# Patient Record
Sex: Male | Born: 1955 | Race: White | Hispanic: Refuse to answer | Marital: Single | Smoking: Current every day smoker
Health system: Southern US, Community
[De-identification: ages and names within clinical notes are randomized; demographics above are authoritative.]

## PROBLEM LIST (undated history)

## (undated) DIAGNOSIS — E039 Hypothyroidism, unspecified: Secondary | ICD-10-CM

## (undated) DIAGNOSIS — I639 Cerebral infarction, unspecified: Secondary | ICD-10-CM

## (undated) DIAGNOSIS — I2510 Atherosclerotic heart disease of native coronary artery without angina pectoris: Secondary | ICD-10-CM

## (undated) DIAGNOSIS — K279 Peptic ulcer, site unspecified, unspecified as acute or chronic, without hemorrhage or perforation: Secondary | ICD-10-CM

## (undated) DIAGNOSIS — I480 Paroxysmal atrial fibrillation: Secondary | ICD-10-CM

## (undated) DIAGNOSIS — I428 Other cardiomyopathies: Secondary | ICD-10-CM

## (undated) DIAGNOSIS — I38 Endocarditis, valve unspecified: Secondary | ICD-10-CM

## (undated) DIAGNOSIS — C679 Malignant neoplasm of bladder, unspecified: Secondary | ICD-10-CM

## (undated) DIAGNOSIS — I10 Essential (primary) hypertension: Secondary | ICD-10-CM

## (undated) DIAGNOSIS — F1010 Alcohol abuse, uncomplicated: Secondary | ICD-10-CM

## (undated) HISTORY — PX: TRANSURETHRAL RESECTION OF BLADDER TUMOR: SHX2575

---

## 2014-04-18 HISTORY — PX: MITRAL VALVE REPLACEMENT: SHX147

## 2014-04-18 HISTORY — PX: AORTIC VALVE REPLACEMENT: SHX41

## 2016-09-24 DIAGNOSIS — T148XXA Other injury of unspecified body region, initial encounter: Secondary | ICD-10-CM | POA: Diagnosis not present

## 2016-09-24 DIAGNOSIS — T07XXXA Unspecified multiple injuries, initial encounter: Secondary | ICD-10-CM | POA: Diagnosis not present

## 2016-09-24 DIAGNOSIS — S80211A Abrasion, right knee, initial encounter: Secondary | ICD-10-CM | POA: Diagnosis not present

## 2016-09-24 DIAGNOSIS — Z7901 Long term (current) use of anticoagulants: Secondary | ICD-10-CM | POA: Diagnosis not present

## 2016-09-24 DIAGNOSIS — S50311A Abrasion of right elbow, initial encounter: Secondary | ICD-10-CM | POA: Diagnosis not present

## 2016-09-24 DIAGNOSIS — S50312A Abrasion of left elbow, initial encounter: Secondary | ICD-10-CM | POA: Diagnosis not present

## 2016-09-24 DIAGNOSIS — S80212A Abrasion, left knee, initial encounter: Secondary | ICD-10-CM | POA: Diagnosis not present

## 2016-10-06 DIAGNOSIS — E871 Hypo-osmolality and hyponatremia: Secondary | ICD-10-CM | POA: Diagnosis not present

## 2016-10-06 DIAGNOSIS — R9431 Abnormal electrocardiogram [ECG] [EKG]: Secondary | ICD-10-CM | POA: Diagnosis not present

## 2016-10-06 DIAGNOSIS — J159 Unspecified bacterial pneumonia: Secondary | ICD-10-CM | POA: Diagnosis not present

## 2016-10-06 DIAGNOSIS — R079 Chest pain, unspecified: Secondary | ICD-10-CM | POA: Diagnosis not present

## 2016-10-06 DIAGNOSIS — R778 Other specified abnormalities of plasma proteins: Secondary | ICD-10-CM | POA: Diagnosis not present

## 2016-10-06 DIAGNOSIS — R0602 Shortness of breath: Secondary | ICD-10-CM | POA: Diagnosis not present

## 2016-10-06 DIAGNOSIS — J9601 Acute respiratory failure with hypoxia: Secondary | ICD-10-CM | POA: Diagnosis not present

## 2016-10-07 DIAGNOSIS — J189 Pneumonia, unspecified organism: Secondary | ICD-10-CM | POA: Diagnosis not present

## 2016-10-08 DIAGNOSIS — Z952 Presence of prosthetic heart valve: Secondary | ICD-10-CM | POA: Diagnosis not present

## 2016-10-08 DIAGNOSIS — I428 Other cardiomyopathies: Secondary | ICD-10-CM | POA: Diagnosis not present

## 2016-10-08 DIAGNOSIS — R748 Abnormal levels of other serum enzymes: Secondary | ICD-10-CM | POA: Diagnosis not present

## 2016-10-08 DIAGNOSIS — I480 Paroxysmal atrial fibrillation: Secondary | ICD-10-CM | POA: Diagnosis not present

## 2016-10-09 DIAGNOSIS — R200 Anesthesia of skin: Secondary | ICD-10-CM | POA: Diagnosis not present

## 2016-10-09 DIAGNOSIS — Z952 Presence of prosthetic heart valve: Secondary | ICD-10-CM | POA: Diagnosis not present

## 2016-10-09 DIAGNOSIS — I480 Paroxysmal atrial fibrillation: Secondary | ICD-10-CM | POA: Diagnosis not present

## 2016-10-09 DIAGNOSIS — R748 Abnormal levels of other serum enzymes: Secondary | ICD-10-CM | POA: Diagnosis not present

## 2016-10-09 DIAGNOSIS — I428 Other cardiomyopathies: Secondary | ICD-10-CM | POA: Diagnosis not present

## 2016-10-10 DIAGNOSIS — R0602 Shortness of breath: Secondary | ICD-10-CM | POA: Diagnosis not present

## 2016-10-10 DIAGNOSIS — Z952 Presence of prosthetic heart valve: Secondary | ICD-10-CM | POA: Diagnosis not present

## 2016-10-10 DIAGNOSIS — I5043 Acute on chronic combined systolic (congestive) and diastolic (congestive) heart failure: Secondary | ICD-10-CM | POA: Diagnosis not present

## 2016-10-10 DIAGNOSIS — R748 Abnormal levels of other serum enzymes: Secondary | ICD-10-CM | POA: Diagnosis not present

## 2016-10-10 DIAGNOSIS — I428 Other cardiomyopathies: Secondary | ICD-10-CM | POA: Diagnosis not present

## 2016-10-11 DIAGNOSIS — Z952 Presence of prosthetic heart valve: Secondary | ICD-10-CM | POA: Diagnosis not present

## 2016-10-11 DIAGNOSIS — I428 Other cardiomyopathies: Secondary | ICD-10-CM | POA: Diagnosis not present

## 2016-10-11 DIAGNOSIS — I480 Paroxysmal atrial fibrillation: Secondary | ICD-10-CM | POA: Diagnosis not present

## 2016-10-11 DIAGNOSIS — R748 Abnormal levels of other serum enzymes: Secondary | ICD-10-CM | POA: Diagnosis not present

## 2016-10-12 DIAGNOSIS — J441 Chronic obstructive pulmonary disease with (acute) exacerbation: Secondary | ICD-10-CM | POA: Diagnosis not present

## 2016-10-12 DIAGNOSIS — Z952 Presence of prosthetic heart valve: Secondary | ICD-10-CM | POA: Diagnosis not present

## 2016-10-12 DIAGNOSIS — I5043 Acute on chronic combined systolic (congestive) and diastolic (congestive) heart failure: Secondary | ICD-10-CM | POA: Diagnosis not present

## 2016-10-12 DIAGNOSIS — I959 Hypotension, unspecified: Secondary | ICD-10-CM | POA: Diagnosis not present

## 2016-10-13 DIAGNOSIS — I472 Ventricular tachycardia: Secondary | ICD-10-CM | POA: Diagnosis not present

## 2016-10-13 DIAGNOSIS — I6523 Occlusion and stenosis of bilateral carotid arteries: Secondary | ICD-10-CM | POA: Diagnosis not present

## 2016-10-13 DIAGNOSIS — Z952 Presence of prosthetic heart valve: Secondary | ICD-10-CM | POA: Diagnosis not present

## 2016-10-13 DIAGNOSIS — I5043 Acute on chronic combined systolic (congestive) and diastolic (congestive) heart failure: Secondary | ICD-10-CM | POA: Diagnosis not present

## 2016-10-16 DIAGNOSIS — R2681 Unsteadiness on feet: Secondary | ICD-10-CM | POA: Diagnosis not present

## 2016-10-16 DIAGNOSIS — J449 Chronic obstructive pulmonary disease, unspecified: Secondary | ICD-10-CM | POA: Diagnosis not present

## 2016-10-16 DIAGNOSIS — R278 Other lack of coordination: Secondary | ICD-10-CM | POA: Diagnosis not present

## 2016-10-16 DIAGNOSIS — E785 Hyperlipidemia, unspecified: Secondary | ICD-10-CM | POA: Diagnosis not present

## 2016-10-16 DIAGNOSIS — R41841 Cognitive communication deficit: Secondary | ICD-10-CM | POA: Diagnosis not present

## 2016-10-16 DIAGNOSIS — D510 Vitamin B12 deficiency anemia due to intrinsic factor deficiency: Secondary | ICD-10-CM | POA: Diagnosis not present

## 2016-10-16 DIAGNOSIS — I214 Non-ST elevation (NSTEMI) myocardial infarction: Secondary | ICD-10-CM | POA: Diagnosis not present

## 2016-10-16 DIAGNOSIS — I639 Cerebral infarction, unspecified: Secondary | ICD-10-CM | POA: Diagnosis not present

## 2016-10-16 DIAGNOSIS — E039 Hypothyroidism, unspecified: Secondary | ICD-10-CM | POA: Diagnosis not present

## 2016-10-16 DIAGNOSIS — Z7901 Long term (current) use of anticoagulants: Secondary | ICD-10-CM | POA: Diagnosis not present

## 2016-10-16 DIAGNOSIS — R1312 Dysphagia, oropharyngeal phase: Secondary | ICD-10-CM | POA: Diagnosis not present

## 2016-10-16 DIAGNOSIS — I5020 Unspecified systolic (congestive) heart failure: Secondary | ICD-10-CM | POA: Diagnosis not present

## 2016-10-16 DIAGNOSIS — I426 Alcoholic cardiomyopathy: Secondary | ICD-10-CM | POA: Diagnosis not present

## 2016-10-16 DIAGNOSIS — I480 Paroxysmal atrial fibrillation: Secondary | ICD-10-CM | POA: Diagnosis not present

## 2016-10-16 DIAGNOSIS — I5022 Chronic systolic (congestive) heart failure: Secondary | ICD-10-CM | POA: Diagnosis not present

## 2016-10-16 DIAGNOSIS — Z8673 Personal history of transient ischemic attack (TIA), and cerebral infarction without residual deficits: Secondary | ICD-10-CM | POA: Diagnosis not present

## 2016-10-16 DIAGNOSIS — Z79899 Other long term (current) drug therapy: Secondary | ICD-10-CM | POA: Diagnosis not present

## 2016-10-16 DIAGNOSIS — J159 Unspecified bacterial pneumonia: Secondary | ICD-10-CM | POA: Diagnosis not present

## 2016-10-16 DIAGNOSIS — R1319 Other dysphagia: Secondary | ICD-10-CM | POA: Diagnosis not present

## 2016-10-16 DIAGNOSIS — M6281 Muscle weakness (generalized): Secondary | ICD-10-CM | POA: Diagnosis not present

## 2016-10-17 DIAGNOSIS — Z7901 Long term (current) use of anticoagulants: Secondary | ICD-10-CM | POA: Diagnosis not present

## 2016-10-18 DIAGNOSIS — Z7901 Long term (current) use of anticoagulants: Secondary | ICD-10-CM | POA: Diagnosis not present

## 2016-10-19 DIAGNOSIS — Z7901 Long term (current) use of anticoagulants: Secondary | ICD-10-CM | POA: Diagnosis not present

## 2016-10-20 DIAGNOSIS — Z79899 Other long term (current) drug therapy: Secondary | ICD-10-CM | POA: Diagnosis not present

## 2016-10-20 DIAGNOSIS — Z7901 Long term (current) use of anticoagulants: Secondary | ICD-10-CM | POA: Diagnosis not present

## 2016-10-21 DIAGNOSIS — Z7901 Long term (current) use of anticoagulants: Secondary | ICD-10-CM | POA: Diagnosis not present

## 2016-10-22 DIAGNOSIS — J449 Chronic obstructive pulmonary disease, unspecified: Secondary | ICD-10-CM | POA: Diagnosis not present

## 2016-10-22 DIAGNOSIS — I480 Paroxysmal atrial fibrillation: Secondary | ICD-10-CM | POA: Diagnosis not present

## 2016-10-22 DIAGNOSIS — E785 Hyperlipidemia, unspecified: Secondary | ICD-10-CM | POA: Diagnosis not present

## 2016-10-22 DIAGNOSIS — E039 Hypothyroidism, unspecified: Secondary | ICD-10-CM | POA: Diagnosis not present

## 2016-10-23 DIAGNOSIS — Z7901 Long term (current) use of anticoagulants: Secondary | ICD-10-CM | POA: Diagnosis not present

## 2016-10-23 DIAGNOSIS — Z79899 Other long term (current) drug therapy: Secondary | ICD-10-CM | POA: Diagnosis not present

## 2016-10-28 DIAGNOSIS — D510 Vitamin B12 deficiency anemia due to intrinsic factor deficiency: Secondary | ICD-10-CM | POA: Diagnosis not present

## 2016-10-28 DIAGNOSIS — Z7901 Long term (current) use of anticoagulants: Secondary | ICD-10-CM | POA: Diagnosis not present

## 2016-10-28 DIAGNOSIS — Z79899 Other long term (current) drug therapy: Secondary | ICD-10-CM | POA: Diagnosis not present

## 2016-11-02 DIAGNOSIS — I5022 Chronic systolic (congestive) heart failure: Secondary | ICD-10-CM | POA: Diagnosis not present

## 2016-11-02 DIAGNOSIS — J159 Unspecified bacterial pneumonia: Secondary | ICD-10-CM | POA: Diagnosis not present

## 2016-11-02 DIAGNOSIS — E039 Hypothyroidism, unspecified: Secondary | ICD-10-CM | POA: Diagnosis not present

## 2016-11-02 DIAGNOSIS — I4891 Unspecified atrial fibrillation: Secondary | ICD-10-CM | POA: Diagnosis not present

## 2016-11-03 DIAGNOSIS — Z952 Presence of prosthetic heart valve: Secondary | ICD-10-CM | POA: Diagnosis not present

## 2016-11-03 DIAGNOSIS — Z7901 Long term (current) use of anticoagulants: Secondary | ICD-10-CM | POA: Diagnosis not present

## 2016-11-03 DIAGNOSIS — I359 Nonrheumatic aortic valve disorder, unspecified: Secondary | ICD-10-CM | POA: Diagnosis not present

## 2016-11-06 DIAGNOSIS — Z7901 Long term (current) use of anticoagulants: Secondary | ICD-10-CM | POA: Diagnosis not present

## 2016-11-06 DIAGNOSIS — I359 Nonrheumatic aortic valve disorder, unspecified: Secondary | ICD-10-CM | POA: Diagnosis not present

## 2016-11-06 DIAGNOSIS — Z952 Presence of prosthetic heart valve: Secondary | ICD-10-CM | POA: Diagnosis not present

## 2016-11-12 DIAGNOSIS — I426 Alcoholic cardiomyopathy: Secondary | ICD-10-CM | POA: Diagnosis not present

## 2016-11-12 DIAGNOSIS — I639 Cerebral infarction, unspecified: Secondary | ICD-10-CM | POA: Diagnosis not present

## 2016-11-12 DIAGNOSIS — Z7901 Long term (current) use of anticoagulants: Secondary | ICD-10-CM | POA: Diagnosis not present

## 2016-11-12 DIAGNOSIS — Z952 Presence of prosthetic heart valve: Secondary | ICD-10-CM | POA: Diagnosis not present

## 2016-11-12 DIAGNOSIS — I480 Paroxysmal atrial fibrillation: Secondary | ICD-10-CM | POA: Diagnosis not present

## 2016-11-19 DIAGNOSIS — Z7901 Long term (current) use of anticoagulants: Secondary | ICD-10-CM | POA: Diagnosis not present

## 2016-11-20 DIAGNOSIS — Z7901 Long term (current) use of anticoagulants: Secondary | ICD-10-CM | POA: Diagnosis not present

## 2016-11-21 DIAGNOSIS — R791 Abnormal coagulation profile: Secondary | ICD-10-CM | POA: Diagnosis not present

## 2016-11-23 DIAGNOSIS — Z7901 Long term (current) use of anticoagulants: Secondary | ICD-10-CM | POA: Diagnosis not present

## 2016-11-26 DIAGNOSIS — Z7901 Long term (current) use of anticoagulants: Secondary | ICD-10-CM | POA: Diagnosis not present

## 2016-12-03 DIAGNOSIS — E876 Hypokalemia: Secondary | ICD-10-CM | POA: Diagnosis not present

## 2016-12-03 DIAGNOSIS — I4891 Unspecified atrial fibrillation: Secondary | ICD-10-CM | POA: Diagnosis not present

## 2016-12-17 DIAGNOSIS — F17200 Nicotine dependence, unspecified, uncomplicated: Secondary | ICD-10-CM | POA: Diagnosis not present

## 2016-12-17 DIAGNOSIS — I639 Cerebral infarction, unspecified: Secondary | ICD-10-CM | POA: Diagnosis not present

## 2016-12-17 DIAGNOSIS — I4891 Unspecified atrial fibrillation: Secondary | ICD-10-CM | POA: Diagnosis not present

## 2016-12-17 DIAGNOSIS — Z952 Presence of prosthetic heart valve: Secondary | ICD-10-CM | POA: Diagnosis not present

## 2016-12-17 DIAGNOSIS — I426 Alcoholic cardiomyopathy: Secondary | ICD-10-CM | POA: Diagnosis not present

## 2017-01-04 DIAGNOSIS — I426 Alcoholic cardiomyopathy: Secondary | ICD-10-CM | POA: Diagnosis not present

## 2017-01-21 DIAGNOSIS — I5032 Chronic diastolic (congestive) heart failure: Secondary | ICD-10-CM | POA: Diagnosis not present

## 2017-01-21 DIAGNOSIS — I639 Cerebral infarction, unspecified: Secondary | ICD-10-CM | POA: Diagnosis not present

## 2017-01-21 DIAGNOSIS — I482 Chronic atrial fibrillation: Secondary | ICD-10-CM | POA: Diagnosis not present

## 2017-01-21 DIAGNOSIS — Z952 Presence of prosthetic heart valve: Secondary | ICD-10-CM | POA: Diagnosis not present

## 2017-02-19 DIAGNOSIS — Z7901 Long term (current) use of anticoagulants: Secondary | ICD-10-CM | POA: Diagnosis not present

## 2017-05-11 DIAGNOSIS — E876 Hypokalemia: Secondary | ICD-10-CM | POA: Diagnosis not present

## 2017-05-11 DIAGNOSIS — I4892 Unspecified atrial flutter: Secondary | ICD-10-CM | POA: Diagnosis not present

## 2017-06-14 DIAGNOSIS — Z7901 Long term (current) use of anticoagulants: Secondary | ICD-10-CM | POA: Diagnosis not present

## 2017-06-14 DIAGNOSIS — I4891 Unspecified atrial fibrillation: Secondary | ICD-10-CM | POA: Diagnosis not present

## 2017-07-19 DIAGNOSIS — I4891 Unspecified atrial fibrillation: Secondary | ICD-10-CM | POA: Diagnosis not present

## 2017-08-02 DIAGNOSIS — Z7901 Long term (current) use of anticoagulants: Secondary | ICD-10-CM | POA: Diagnosis not present

## 2017-08-02 DIAGNOSIS — I4891 Unspecified atrial fibrillation: Secondary | ICD-10-CM | POA: Diagnosis not present

## 2017-08-03 DIAGNOSIS — Z23 Encounter for immunization: Secondary | ICD-10-CM | POA: Diagnosis not present

## 2017-08-25 DIAGNOSIS — I480 Paroxysmal atrial fibrillation: Secondary | ICD-10-CM | POA: Diagnosis not present

## 2017-08-25 DIAGNOSIS — Z8673 Personal history of transient ischemic attack (TIA), and cerebral infarction without residual deficits: Secondary | ICD-10-CM | POA: Diagnosis not present

## 2017-08-25 DIAGNOSIS — I5022 Chronic systolic (congestive) heart failure: Secondary | ICD-10-CM | POA: Diagnosis not present

## 2017-08-25 DIAGNOSIS — Z952 Presence of prosthetic heart valve: Secondary | ICD-10-CM | POA: Diagnosis not present

## 2017-09-02 DIAGNOSIS — I480 Paroxysmal atrial fibrillation: Secondary | ICD-10-CM | POA: Diagnosis not present

## 2017-09-08 DIAGNOSIS — I480 Paroxysmal atrial fibrillation: Secondary | ICD-10-CM | POA: Diagnosis not present

## 2017-09-22 DIAGNOSIS — I480 Paroxysmal atrial fibrillation: Secondary | ICD-10-CM | POA: Diagnosis not present

## 2017-10-20 DIAGNOSIS — I4891 Unspecified atrial fibrillation: Secondary | ICD-10-CM | POA: Diagnosis not present

## 2017-11-17 DIAGNOSIS — I480 Paroxysmal atrial fibrillation: Secondary | ICD-10-CM | POA: Diagnosis not present

## 2017-11-24 DIAGNOSIS — Z952 Presence of prosthetic heart valve: Secondary | ICD-10-CM | POA: Diagnosis not present

## 2017-11-24 DIAGNOSIS — E039 Hypothyroidism, unspecified: Secondary | ICD-10-CM | POA: Diagnosis not present

## 2017-11-24 DIAGNOSIS — I252 Old myocardial infarction: Secondary | ICD-10-CM | POA: Diagnosis not present

## 2017-11-24 DIAGNOSIS — I5022 Chronic systolic (congestive) heart failure: Secondary | ICD-10-CM | POA: Diagnosis not present

## 2017-11-24 DIAGNOSIS — I428 Other cardiomyopathies: Secondary | ICD-10-CM | POA: Diagnosis not present

## 2017-11-24 DIAGNOSIS — F17200 Nicotine dependence, unspecified, uncomplicated: Secondary | ICD-10-CM | POA: Diagnosis not present

## 2017-11-24 DIAGNOSIS — I480 Paroxysmal atrial fibrillation: Secondary | ICD-10-CM | POA: Diagnosis not present

## 2017-11-24 DIAGNOSIS — Z8673 Personal history of transient ischemic attack (TIA), and cerebral infarction without residual deficits: Secondary | ICD-10-CM | POA: Diagnosis not present

## 2017-12-08 DIAGNOSIS — I480 Paroxysmal atrial fibrillation: Secondary | ICD-10-CM | POA: Diagnosis not present

## 2018-01-05 DIAGNOSIS — I480 Paroxysmal atrial fibrillation: Secondary | ICD-10-CM | POA: Diagnosis not present

## 2018-02-02 DIAGNOSIS — I480 Paroxysmal atrial fibrillation: Secondary | ICD-10-CM | POA: Diagnosis not present

## 2018-02-16 DIAGNOSIS — I480 Paroxysmal atrial fibrillation: Secondary | ICD-10-CM | POA: Diagnosis not present

## 2018-03-16 DIAGNOSIS — I480 Paroxysmal atrial fibrillation: Secondary | ICD-10-CM | POA: Diagnosis not present

## 2018-04-15 DIAGNOSIS — Z8673 Personal history of transient ischemic attack (TIA), and cerebral infarction without residual deficits: Secondary | ICD-10-CM | POA: Diagnosis not present

## 2018-04-15 DIAGNOSIS — I480 Paroxysmal atrial fibrillation: Secondary | ICD-10-CM | POA: Diagnosis not present

## 2018-04-15 DIAGNOSIS — I428 Other cardiomyopathies: Secondary | ICD-10-CM | POA: Diagnosis not present

## 2018-04-15 DIAGNOSIS — I252 Old myocardial infarction: Secondary | ICD-10-CM | POA: Diagnosis not present

## 2018-04-15 DIAGNOSIS — Z952 Presence of prosthetic heart valve: Secondary | ICD-10-CM | POA: Diagnosis not present

## 2018-04-15 DIAGNOSIS — I5022 Chronic systolic (congestive) heart failure: Secondary | ICD-10-CM | POA: Diagnosis not present

## 2018-04-20 DIAGNOSIS — Z952 Presence of prosthetic heart valve: Secondary | ICD-10-CM | POA: Diagnosis not present

## 2018-05-13 DIAGNOSIS — I480 Paroxysmal atrial fibrillation: Secondary | ICD-10-CM | POA: Diagnosis not present

## 2018-05-13 DIAGNOSIS — Z7901 Long term (current) use of anticoagulants: Secondary | ICD-10-CM | POA: Diagnosis not present

## 2018-05-27 DIAGNOSIS — I480 Paroxysmal atrial fibrillation: Secondary | ICD-10-CM | POA: Diagnosis not present

## 2018-05-27 DIAGNOSIS — Z7901 Long term (current) use of anticoagulants: Secondary | ICD-10-CM | POA: Diagnosis not present

## 2018-06-24 DIAGNOSIS — I480 Paroxysmal atrial fibrillation: Secondary | ICD-10-CM | POA: Diagnosis not present

## 2018-06-24 DIAGNOSIS — Z7901 Long term (current) use of anticoagulants: Secondary | ICD-10-CM | POA: Diagnosis not present

## 2018-07-08 DIAGNOSIS — I480 Paroxysmal atrial fibrillation: Secondary | ICD-10-CM | POA: Diagnosis not present

## 2018-07-08 DIAGNOSIS — Z7901 Long term (current) use of anticoagulants: Secondary | ICD-10-CM | POA: Diagnosis not present

## 2018-07-28 DIAGNOSIS — Z23 Encounter for immunization: Secondary | ICD-10-CM | POA: Diagnosis not present

## 2018-08-05 DIAGNOSIS — I480 Paroxysmal atrial fibrillation: Secondary | ICD-10-CM | POA: Diagnosis not present

## 2018-08-05 DIAGNOSIS — Z7901 Long term (current) use of anticoagulants: Secondary | ICD-10-CM | POA: Diagnosis not present

## 2018-08-17 DIAGNOSIS — I428 Other cardiomyopathies: Secondary | ICD-10-CM | POA: Diagnosis not present

## 2018-08-17 DIAGNOSIS — Z953 Presence of xenogenic heart valve: Secondary | ICD-10-CM | POA: Diagnosis not present

## 2018-08-17 DIAGNOSIS — Z952 Presence of prosthetic heart valve: Secondary | ICD-10-CM | POA: Diagnosis not present

## 2018-08-17 DIAGNOSIS — F17210 Nicotine dependence, cigarettes, uncomplicated: Secondary | ICD-10-CM | POA: Diagnosis not present

## 2018-08-17 DIAGNOSIS — Z7901 Long term (current) use of anticoagulants: Secondary | ICD-10-CM | POA: Diagnosis not present

## 2018-08-17 DIAGNOSIS — I252 Old myocardial infarction: Secondary | ICD-10-CM | POA: Diagnosis not present

## 2018-08-17 DIAGNOSIS — I5022 Chronic systolic (congestive) heart failure: Secondary | ICD-10-CM | POA: Diagnosis not present

## 2018-08-17 DIAGNOSIS — I480 Paroxysmal atrial fibrillation: Secondary | ICD-10-CM | POA: Diagnosis not present

## 2018-08-17 DIAGNOSIS — Z8673 Personal history of transient ischemic attack (TIA), and cerebral infarction without residual deficits: Secondary | ICD-10-CM | POA: Diagnosis not present

## 2018-09-02 DIAGNOSIS — Z7901 Long term (current) use of anticoagulants: Secondary | ICD-10-CM | POA: Diagnosis not present

## 2018-09-02 DIAGNOSIS — I480 Paroxysmal atrial fibrillation: Secondary | ICD-10-CM | POA: Diagnosis not present

## 2018-09-14 DIAGNOSIS — Z7901 Long term (current) use of anticoagulants: Secondary | ICD-10-CM | POA: Diagnosis not present

## 2018-09-14 DIAGNOSIS — I480 Paroxysmal atrial fibrillation: Secondary | ICD-10-CM | POA: Diagnosis not present

## 2018-10-14 DIAGNOSIS — I480 Paroxysmal atrial fibrillation: Secondary | ICD-10-CM | POA: Diagnosis not present

## 2018-10-14 DIAGNOSIS — Z7901 Long term (current) use of anticoagulants: Secondary | ICD-10-CM | POA: Diagnosis not present

## 2018-11-11 DIAGNOSIS — Z7901 Long term (current) use of anticoagulants: Secondary | ICD-10-CM | POA: Diagnosis not present

## 2018-11-11 DIAGNOSIS — I480 Paroxysmal atrial fibrillation: Secondary | ICD-10-CM | POA: Diagnosis not present

## 2018-12-09 DIAGNOSIS — Z7901 Long term (current) use of anticoagulants: Secondary | ICD-10-CM | POA: Diagnosis not present

## 2018-12-09 DIAGNOSIS — I480 Paroxysmal atrial fibrillation: Secondary | ICD-10-CM | POA: Diagnosis not present

## 2019-01-13 DIAGNOSIS — I5022 Chronic systolic (congestive) heart failure: Secondary | ICD-10-CM | POA: Diagnosis not present

## 2019-01-13 DIAGNOSIS — I252 Old myocardial infarction: Secondary | ICD-10-CM | POA: Diagnosis not present

## 2019-01-13 DIAGNOSIS — I428 Other cardiomyopathies: Secondary | ICD-10-CM | POA: Diagnosis not present

## 2019-01-13 DIAGNOSIS — Z952 Presence of prosthetic heart valve: Secondary | ICD-10-CM | POA: Diagnosis not present

## 2019-01-13 DIAGNOSIS — Z7901 Long term (current) use of anticoagulants: Secondary | ICD-10-CM | POA: Diagnosis not present

## 2019-01-13 DIAGNOSIS — Z8679 Personal history of other diseases of the circulatory system: Secondary | ICD-10-CM | POA: Diagnosis not present

## 2019-01-13 DIAGNOSIS — E039 Hypothyroidism, unspecified: Secondary | ICD-10-CM | POA: Diagnosis not present

## 2019-01-13 DIAGNOSIS — F17210 Nicotine dependence, cigarettes, uncomplicated: Secondary | ICD-10-CM | POA: Diagnosis not present

## 2019-01-13 DIAGNOSIS — Z8673 Personal history of transient ischemic attack (TIA), and cerebral infarction without residual deficits: Secondary | ICD-10-CM | POA: Diagnosis not present

## 2019-01-13 DIAGNOSIS — I480 Paroxysmal atrial fibrillation: Secondary | ICD-10-CM | POA: Diagnosis not present

## 2019-01-26 DIAGNOSIS — I480 Paroxysmal atrial fibrillation: Secondary | ICD-10-CM | POA: Diagnosis not present

## 2019-01-26 DIAGNOSIS — Z7901 Long term (current) use of anticoagulants: Secondary | ICD-10-CM | POA: Diagnosis not present

## 2019-02-16 DIAGNOSIS — R310 Gross hematuria: Secondary | ICD-10-CM | POA: Diagnosis not present

## 2019-02-16 DIAGNOSIS — N401 Enlarged prostate with lower urinary tract symptoms: Secondary | ICD-10-CM | POA: Diagnosis not present

## 2019-02-21 DIAGNOSIS — R310 Gross hematuria: Secondary | ICD-10-CM | POA: Diagnosis not present

## 2019-02-22 DIAGNOSIS — R310 Gross hematuria: Secondary | ICD-10-CM | POA: Diagnosis not present

## 2019-02-22 DIAGNOSIS — N401 Enlarged prostate with lower urinary tract symptoms: Secondary | ICD-10-CM | POA: Diagnosis not present

## 2019-02-22 DIAGNOSIS — N3289 Other specified disorders of bladder: Secondary | ICD-10-CM | POA: Diagnosis not present

## 2019-02-23 DIAGNOSIS — I480 Paroxysmal atrial fibrillation: Secondary | ICD-10-CM | POA: Diagnosis not present

## 2019-02-23 DIAGNOSIS — Z7901 Long term (current) use of anticoagulants: Secondary | ICD-10-CM | POA: Diagnosis not present

## 2019-03-09 DIAGNOSIS — Z7901 Long term (current) use of anticoagulants: Secondary | ICD-10-CM | POA: Diagnosis not present

## 2019-03-09 DIAGNOSIS — I480 Paroxysmal atrial fibrillation: Secondary | ICD-10-CM | POA: Diagnosis not present

## 2019-03-21 DIAGNOSIS — Z125 Encounter for screening for malignant neoplasm of prostate: Secondary | ICD-10-CM | POA: Diagnosis not present

## 2019-03-21 DIAGNOSIS — N3289 Other specified disorders of bladder: Secondary | ICD-10-CM | POA: Diagnosis not present

## 2019-04-06 DIAGNOSIS — I480 Paroxysmal atrial fibrillation: Secondary | ICD-10-CM | POA: Diagnosis not present

## 2019-04-06 DIAGNOSIS — Z7901 Long term (current) use of anticoagulants: Secondary | ICD-10-CM | POA: Diagnosis not present

## 2019-04-20 DIAGNOSIS — I480 Paroxysmal atrial fibrillation: Secondary | ICD-10-CM | POA: Diagnosis not present

## 2019-04-20 DIAGNOSIS — Z7901 Long term (current) use of anticoagulants: Secondary | ICD-10-CM | POA: Diagnosis not present

## 2019-05-24 DIAGNOSIS — Z7901 Long term (current) use of anticoagulants: Secondary | ICD-10-CM | POA: Diagnosis not present

## 2019-05-24 DIAGNOSIS — I428 Other cardiomyopathies: Secondary | ICD-10-CM | POA: Diagnosis not present

## 2019-05-24 DIAGNOSIS — I480 Paroxysmal atrial fibrillation: Secondary | ICD-10-CM | POA: Diagnosis not present

## 2019-05-24 DIAGNOSIS — I5022 Chronic systolic (congestive) heart failure: Secondary | ICD-10-CM | POA: Diagnosis not present

## 2019-05-24 DIAGNOSIS — I252 Old myocardial infarction: Secondary | ICD-10-CM | POA: Diagnosis not present

## 2019-05-24 DIAGNOSIS — Z8673 Personal history of transient ischemic attack (TIA), and cerebral infarction without residual deficits: Secondary | ICD-10-CM | POA: Diagnosis not present

## 2019-05-24 DIAGNOSIS — Z952 Presence of prosthetic heart valve: Secondary | ICD-10-CM | POA: Diagnosis not present

## 2019-06-05 DIAGNOSIS — Z952 Presence of prosthetic heart valve: Secondary | ICD-10-CM | POA: Diagnosis not present

## 2019-06-21 DIAGNOSIS — I480 Paroxysmal atrial fibrillation: Secondary | ICD-10-CM | POA: Diagnosis not present

## 2019-06-21 DIAGNOSIS — Z7901 Long term (current) use of anticoagulants: Secondary | ICD-10-CM | POA: Diagnosis not present

## 2019-06-29 DIAGNOSIS — Z23 Encounter for immunization: Secondary | ICD-10-CM | POA: Diagnosis not present

## 2019-07-05 DIAGNOSIS — Z7901 Long term (current) use of anticoagulants: Secondary | ICD-10-CM | POA: Diagnosis not present

## 2019-07-05 DIAGNOSIS — I480 Paroxysmal atrial fibrillation: Secondary | ICD-10-CM | POA: Diagnosis not present

## 2019-10-20 HISTORY — PX: LAPAROSCOPIC APPENDECTOMY: SUR753

## 2021-01-03 ENCOUNTER — Inpatient Hospital Stay (HOSPITAL_COMMUNITY)
Admission: AD | Admit: 2021-01-03 | Discharge: 2021-01-21 | DRG: 871 | Disposition: A | Discharge status: Skilled Nursing Facility | Payer: Medicare HMO | Source: Other Acute Inpatient Hospital | Attending: Family Medicine | Admitting: Family Medicine

## 2021-01-03 ENCOUNTER — Encounter (HOSPITAL_COMMUNITY): Payer: Self-pay | Admitting: Internal Medicine

## 2021-01-03 ENCOUNTER — Inpatient Hospital Stay (HOSPITAL_COMMUNITY): Payer: Medicare HMO

## 2021-01-03 DIAGNOSIS — M542 Cervicalgia: Secondary | ICD-10-CM | POA: Diagnosis not present

## 2021-01-03 DIAGNOSIS — I428 Other cardiomyopathies: Secondary | ICD-10-CM | POA: Diagnosis present

## 2021-01-03 DIAGNOSIS — I252 Old myocardial infarction: Secondary | ICD-10-CM | POA: Diagnosis present

## 2021-01-03 DIAGNOSIS — I6340 Cerebral infarction due to embolism of unspecified cerebral artery: Secondary | ICD-10-CM | POA: Diagnosis present

## 2021-01-03 DIAGNOSIS — D696 Thrombocytopenia, unspecified: Secondary | ICD-10-CM | POA: Diagnosis present

## 2021-01-03 DIAGNOSIS — K083 Retained dental root: Secondary | ICD-10-CM | POA: Diagnosis present

## 2021-01-03 DIAGNOSIS — D62 Acute posthemorrhagic anemia: Secondary | ICD-10-CM | POA: Diagnosis present

## 2021-01-03 DIAGNOSIS — D649 Anemia, unspecified: Secondary | ICD-10-CM | POA: Diagnosis not present

## 2021-01-03 DIAGNOSIS — M4802 Spinal stenosis, cervical region: Secondary | ICD-10-CM | POA: Diagnosis present

## 2021-01-03 DIAGNOSIS — I2510 Atherosclerotic heart disease of native coronary artery without angina pectoris: Secondary | ICD-10-CM | POA: Diagnosis present

## 2021-01-03 DIAGNOSIS — Z8711 Personal history of peptic ulcer disease: Secondary | ICD-10-CM | POA: Diagnosis not present

## 2021-01-03 DIAGNOSIS — K922 Gastrointestinal hemorrhage, unspecified: Secondary | ICD-10-CM | POA: Diagnosis present

## 2021-01-03 DIAGNOSIS — E039 Hypothyroidism, unspecified: Secondary | ICD-10-CM | POA: Diagnosis present

## 2021-01-03 DIAGNOSIS — K264 Chronic or unspecified duodenal ulcer with hemorrhage: Secondary | ICD-10-CM | POA: Diagnosis present

## 2021-01-03 DIAGNOSIS — B955 Unspecified streptococcus as the cause of diseases classified elsewhere: Secondary | ICD-10-CM | POA: Diagnosis not present

## 2021-01-03 DIAGNOSIS — I426 Alcoholic cardiomyopathy: Secondary | ICD-10-CM | POA: Diagnosis present

## 2021-01-03 DIAGNOSIS — K269 Duodenal ulcer, unspecified as acute or chronic, without hemorrhage or perforation: Secondary | ICD-10-CM | POA: Diagnosis not present

## 2021-01-03 DIAGNOSIS — Z20822 Contact with and (suspected) exposure to covid-19: Secondary | ICD-10-CM | POA: Diagnosis present

## 2021-01-03 DIAGNOSIS — I6389 Other cerebral infarction: Secondary | ICD-10-CM | POA: Diagnosis not present

## 2021-01-03 DIAGNOSIS — C679 Malignant neoplasm of bladder, unspecified: Secondary | ICD-10-CM | POA: Diagnosis present

## 2021-01-03 DIAGNOSIS — I059 Rheumatic mitral valve disease, unspecified: Secondary | ICD-10-CM

## 2021-01-03 DIAGNOSIS — R0602 Shortness of breath: Secondary | ICD-10-CM

## 2021-01-03 DIAGNOSIS — Z5989 Other problems related to housing and economic circumstances: Secondary | ICD-10-CM | POA: Diagnosis not present

## 2021-01-03 DIAGNOSIS — Z8673 Personal history of transient ischemic attack (TIA), and cerebral infarction without residual deficits: Secondary | ICD-10-CM

## 2021-01-03 DIAGNOSIS — K253 Acute gastric ulcer without hemorrhage or perforation: Secondary | ICD-10-CM | POA: Diagnosis not present

## 2021-01-03 DIAGNOSIS — R29701 NIHSS score 1: Secondary | ICD-10-CM | POA: Diagnosis present

## 2021-01-03 DIAGNOSIS — F1020 Alcohol dependence, uncomplicated: Secondary | ICD-10-CM | POA: Diagnosis present

## 2021-01-03 DIAGNOSIS — R059 Cough, unspecified: Secondary | ICD-10-CM | POA: Diagnosis present

## 2021-01-03 DIAGNOSIS — A409 Streptococcal sepsis, unspecified: Secondary | ICD-10-CM | POA: Diagnosis present

## 2021-01-03 DIAGNOSIS — K029 Dental caries, unspecified: Secondary | ICD-10-CM | POA: Diagnosis present

## 2021-01-03 DIAGNOSIS — R791 Abnormal coagulation profile: Secondary | ICD-10-CM | POA: Diagnosis present

## 2021-01-03 DIAGNOSIS — J449 Chronic obstructive pulmonary disease, unspecified: Secondary | ICD-10-CM | POA: Diagnosis present

## 2021-01-03 DIAGNOSIS — R519 Headache, unspecified: Secondary | ICD-10-CM | POA: Diagnosis not present

## 2021-01-03 DIAGNOSIS — I10 Essential (primary) hypertension: Secondary | ICD-10-CM | POA: Diagnosis present

## 2021-01-03 DIAGNOSIS — K319 Disease of stomach and duodenum, unspecified: Secondary | ICD-10-CM | POA: Diagnosis present

## 2021-01-03 DIAGNOSIS — Z952 Presence of prosthetic heart valve: Secondary | ICD-10-CM

## 2021-01-03 DIAGNOSIS — E785 Hyperlipidemia, unspecified: Secondary | ICD-10-CM | POA: Diagnosis present

## 2021-01-03 DIAGNOSIS — K0530 Chronic periodontitis, unspecified: Secondary | ICD-10-CM | POA: Diagnosis present

## 2021-01-03 DIAGNOSIS — K045 Chronic apical periodontitis: Secondary | ICD-10-CM

## 2021-01-03 DIAGNOSIS — I480 Paroxysmal atrial fibrillation: Principal | ICD-10-CM | POA: Diagnosis present

## 2021-01-03 DIAGNOSIS — K3189 Other diseases of stomach and duodenum: Secondary | ICD-10-CM | POA: Diagnosis not present

## 2021-01-03 DIAGNOSIS — R7881 Bacteremia: Secondary | ICD-10-CM

## 2021-01-03 DIAGNOSIS — F17210 Nicotine dependence, cigarettes, uncomplicated: Secondary | ICD-10-CM | POA: Diagnosis present

## 2021-01-03 DIAGNOSIS — K254 Chronic or unspecified gastric ulcer with hemorrhage: Secondary | ICD-10-CM | POA: Diagnosis present

## 2021-01-03 DIAGNOSIS — Z95828 Presence of other vascular implants and grafts: Secondary | ICD-10-CM

## 2021-01-03 DIAGNOSIS — I639 Cerebral infarction, unspecified: Secondary | ICD-10-CM | POA: Diagnosis not present

## 2021-01-03 DIAGNOSIS — T39395A Adverse effect of other nonsteroidal anti-inflammatory drugs [NSAID], initial encounter: Secondary | ICD-10-CM | POA: Diagnosis present

## 2021-01-03 DIAGNOSIS — B954 Other streptococcus as the cause of diseases classified elsewhere: Secondary | ICD-10-CM | POA: Diagnosis not present

## 2021-01-03 DIAGNOSIS — K056 Periodontal disease, unspecified: Secondary | ICD-10-CM

## 2021-01-03 DIAGNOSIS — I361 Nonrheumatic tricuspid (valve) insufficiency: Secondary | ICD-10-CM | POA: Diagnosis not present

## 2021-01-03 HISTORY — DX: Alcohol abuse, uncomplicated: F10.10

## 2021-01-03 HISTORY — DX: Paroxysmal atrial fibrillation: I48.0

## 2021-01-03 HISTORY — DX: Peptic ulcer, site unspecified, unspecified as acute or chronic, without hemorrhage or perforation: K27.9

## 2021-01-03 HISTORY — DX: Hypothyroidism, unspecified: E03.9

## 2021-01-03 HISTORY — DX: Atherosclerotic heart disease of native coronary artery without angina pectoris: I25.10

## 2021-01-03 HISTORY — DX: Essential (primary) hypertension: I10

## 2021-01-03 HISTORY — DX: Other cardiomyopathies: I42.8

## 2021-01-03 HISTORY — DX: Cerebral infarction, unspecified: I63.9

## 2021-01-03 HISTORY — DX: Endocarditis, valve unspecified: I38

## 2021-01-03 HISTORY — DX: Malignant neoplasm of bladder, unspecified: C67.9

## 2021-01-03 IMAGING — DX DG CHEST 1V PORT
1 series · 1 of 1 positions shown · non-contrast
Comparison: [DATE]

CLINICAL DATA: Cough.  Acute blood loss.

EXAM:
PORTABLE CHEST 1 VIEW

[chest ap]
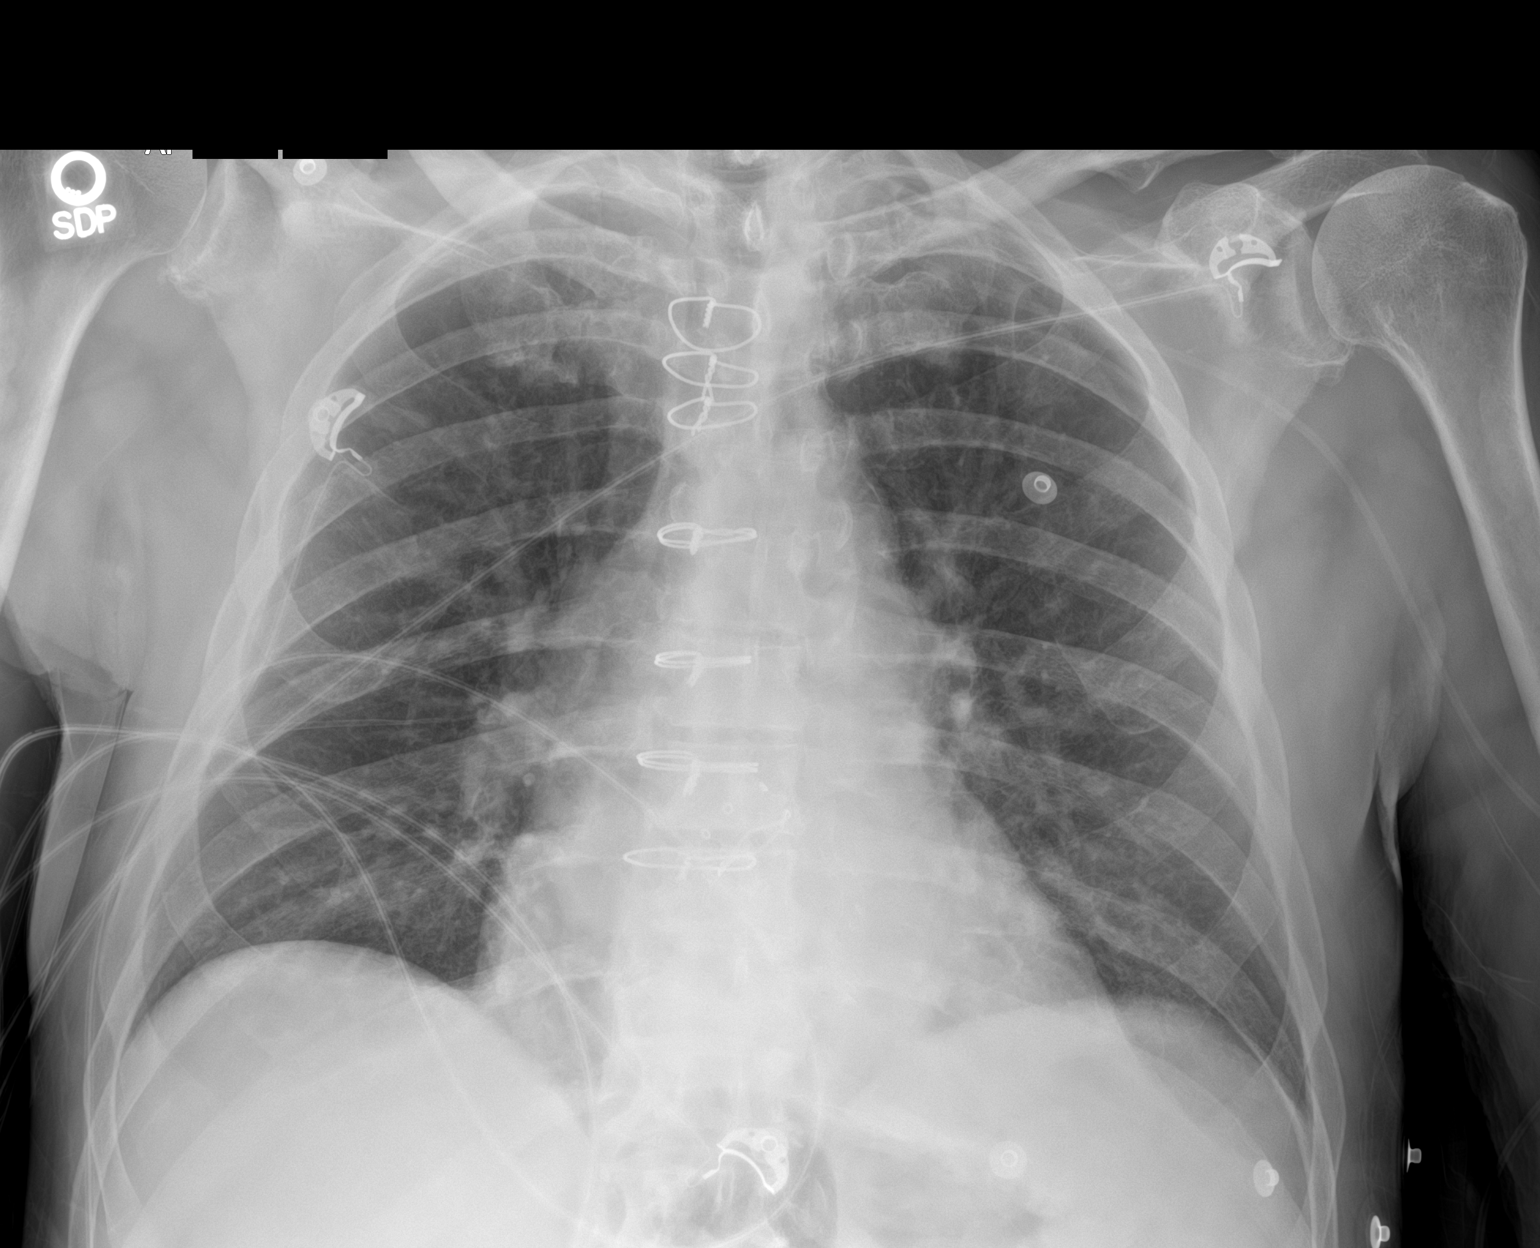

[1 of 1 positions shown; findings below may reference images not displayed]

FINDINGS: Stable sternotomy wires and cardiomegaly. The hila and mediastinum
are unremarkable. Skin fold over the upper lateral left chest. No
pneumothorax. No nodules or masses. Mild interstitial prominence
without overt edema. No focal infiltrate.
IMPRESSION: Probable mild pulmonary venous congestion. No other acute
abnormalities.

## 2021-01-03 MED ORDER — ACETAMINOPHEN 650 MG RE SUPP: 650.0000 mg | Freq: Four times a day (QID) | RECTAL | Status: DC | PRN

## 2021-01-03 MED ORDER — SODIUM CHLORIDE 0.9 % IV SOLN: INTRAVENOUS | Status: DC

## 2021-01-03 MED ORDER — SODIUM CHLORIDE 0.9 % IV SOLN
80.0000 mg | Freq: Once | INTRAVENOUS | Status: DC
  Filled 2021-01-03: qty 80

## 2021-01-03 MED ORDER — ONDANSETRON HCL 4 MG PO TABS: 4.0000 mg | ORAL_TABLET | Freq: Four times a day (QID) | ORAL | Status: DC | PRN

## 2021-01-03 MED ORDER — SODIUM CHLORIDE 0.9 % IV SOLN
8.0000 mg/h | INTRAVENOUS | Status: DC
  Filled 2021-01-03: qty 80

## 2021-01-03 MED ORDER — STROKE: EARLY STAGES OF RECOVERY BOOK
Status: AC
  Filled 2021-01-03: qty 1

## 2021-01-03 MED ORDER — STROKE: EARLY STAGES OF RECOVERY BOOK: Freq: Once | Status: AC

## 2021-01-03 MED ORDER — ACETAMINOPHEN 325 MG PO TABS
650.0000 mg | ORAL_TABLET | Freq: Four times a day (QID) | ORAL | Status: DC | PRN
  Administered 2021-01-04 – 2021-01-21 (×33): 650 mg via ORAL
  Filled 2021-01-03 (×34): qty 2

## 2021-01-03 MED ORDER — PANTOPRAZOLE SODIUM 40 MG IV SOLR: 40.0000 mg | Freq: Two times a day (BID) | INTRAVENOUS | Status: DC

## 2021-01-03 MED ORDER — ONDANSETRON HCL 4 MG/2ML IJ SOLN: 4.0000 mg | Freq: Four times a day (QID) | INTRAMUSCULAR | Status: DC | PRN

## 2021-01-03 MED ORDER — LACTATED RINGERS IV BOLUS
1000.0000 mL | Freq: Once | INTRAVENOUS | Status: AC
  Administered 2021-01-03: 1000 mL via INTRAVENOUS

## 2021-01-03 NOTE — Consult Note (Addendum)
NEUROLOGY CONSULTATION NOTE   Date of service: January 03, 2021 Patient Name: Donald Harrington MRN:  [id number] DOB:  June 10, 1956 Reason for consult: "embolic strokes and GI bleed" _ _ _   _ __   _ __ _ _  __ __   _ __   __ _  History of Present Illness  Donald Harrington is a 65 y.o. 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 INR of 1.3. Tachycardia(112) and hypotensive(91/58) on arrival with Hb of 9. Baseline Hb is 16. He was started on fluids, PPI, warfarin on hold and we were asked to assist with management of embolic strokes.  Patient refused to provide me any history. He did respond to some of my questions. Endorses mild bifrontal headaches. Per chart review, was seen by PCP for these headaches and L arm numbness, MRI Brain by PCP with subcentimeter bilateral cerebral and cerebellar infarcts. He was sent to the ED was seen by teleneurology and recommend standard stroke workup.  NIHSS: 0   ROS   Unable to obtain a detailed ROS as patient declined to provide any history.  Past History   Past Medical History:  Diagnosis Date  . Alcohol abuse   . Bladder cancer ([place])    remission s/p TURBT  . CAD (coronary artery disease), native coronary artery    MI in setting of endocarditis  . 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 Endocarditis    Mitral and Aortic valve 2015  . NICM (nonischemic cardiomyopathy) ([place])    Normal EF as of 2020 echo  . PAF (paroxysmal atrial fibrillation) (HCC)    coumadin anticoagulation due to h/o valve replacements with bioprosthetic valves    No family history on file. Social History   Socioeconomic History  . Marital status: Not on file    Spouse name: Not on file  . Number of children: Not on file  . Years of education: Not on file  . Highest education level: Not on file   Occupational History  . Not on file  Tobacco Use  . Smoking status: Not on file  . Smokeless tobacco: Not on file  Substance and Sexual Activity  . Alcohol use: Not Currently    Comment: last 2017  . Drug use: Not on file  . Sexual activity: Not on file  Other Topics Concern  . Not on file  Social History Narrative  . Not on file   Social Determinants of Health   Financial Resource Strain: Not on file  Food Insecurity: Not on file  Transportation Needs: Not on file  Physical Activity: Not on file  Stress: Not on file  Social Connections: Not on file   Not on File  Medications   No medications prior to admission.     Vitals   Vitals:   01/03/21 2004  BP: (!) 91/58  Pulse: (!) 112  Resp: 18  Temp: 99.7 F (37.6 C)  TempSrc: Axillary  SpO2: 100%     There is no height or weight on file to calculate BMI.  Physical Exam   General: Laying comfortably in bed; in no acute distress. HENT: Normal oropharynx and mucosa. Normal external appearance of ears and nose. Neck: Supple, no pain or tenderness CV: No JVD. No peripheral edema. Pulmonary: Symmetric Chest rise. Normal  respiratory effort. Abdomen: Soft to touch, non-tender. Ext: No cyanosis, edema, or deformity Skin: No rash. Normal palpation of skin.  Musculoskeletal: Normal digits and nails by inspection. No clubbing.  Neurologic Examination  Mental status/Cognition: Alert, oriented to self atleast. Declined to answer other orientation questions. Speech/language: Fluent, comprehension intact, object naming intact. Cranial nerves:   CN II Pupils equal and reactive to light, no VF deficits   CN III,IV,VI EOM intact, no gaze preference or deviation, no nystagmus   CN V normal sensation in V1, V2, and V3 segments bilaterally   CN VII no asymmetry, no nasolabial fold flattening   CN VIII normal hearing to speech   CN IX & X normal palatal elevation, no uvular deviation   CN XI 5/5 head turn and 5/5 shoulder  shrug bilaterally   CN XII midline tongue protrusion   Motor:  Muscle bulk: normal, tone normal Mvmt Root Nerve  Muscle Right Left Comments  SA C5/6 Ax Deltoid     EF C5/6 Mc Biceps 5 5   EE C6/7/8 Rad Triceps 5 5   WF C6/7 Med FCR     WE C7/8 PIN ECU     F Ab C8/T1 U ADM/FDI 5 5   HF L1/2/3 Fem Illopsoas 5 5   KE L2/3/4 Fem Quad     DF L4/5 D Peron Tib Ant 5 5   PF S1/2 Tibial Grc/Sol 5 5    Reflexes:  Right Left Comments  Pectoralis      Biceps (C5/6) 2 2   Brachioradialis (C5/6) 2 2    Triceps (C6/7) 2 2    Patellar (L3/4) 2 2    Achilles (S1)      Hoffman      Plantar     Jaw jerk    Sensation:  Light touch intact   Pin prick    Temperature    Vibration   Proprioception    Coordination/Complex Motor:  - Finger to Nose declined to do - Heel to shin declined to do. - no obvious ataxia on my evaluation.  Labs   CBC: No results for input(s): WBC, NEUTROABS, HGB, HCT, MCV, PLT in the last 168 hours.  Basic Metabolic Panel: No results found for: NA, K, CO2, GLUCOSE, BUN, CREATININE, CALCIUM, GFRNONAA, GFRAA, GLUCOSE Lipid Panel: No results found for: LDLCALC HgbA1c: No results found for: HGBA1C Urine Drug Screen: No results found for: LABOPIA, COCAINSCRNUR, LABBENZ, AMPHETMU, THCU, LABBARB  Alcohol Level No results found for: [facility]  MRI Brain  Per notes from OSH: He has acute/subacute bilateral cerebral and cerebellar ischemic emboliclacunar cva's.  Impression   Donald Harrington is a 65 y.o. 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 He likely had infarcts due to subtherapeutic INR. Okay to hold off on Anticoagulation for now. Can start on Aspirin 81mg  daily if okay with DVT prophylaxis if okay with primary team for now..  Recommendations  Plan:  - Frequent Neuro checks per stroke unit  protocol - Recommend Vascular imaging with MRA Angio Head without contrast and US Carotid doppler - Recommend obtaining TTE - Recommend obtaining Lipid panel with LDL - Please start statin if LDL > 70 - Recommend HbA1c - Antithrombotic - can start aspirin 81mg  daily for now, when okay with primary team. - Recommend DVT ppx - SBP  goal - permissive hypertension first 24 h < 220/110. Held home meds.  - Recommend Telemetry monitoring for arrythmia - Recommend bedside swallow screen prior to PO intake. - Stroke education booklet - Recommend PT/OT/SLP consult - okay to hold off on warfarin 2/2 concern for GI bleed for now. ______________________________________________________________________   Thank you for the opportunity to take part in the care of this patient. If you have any further questions, please contact the neurology consultation attending.  Signed,  [place] Pager Number [phone] _ _ _   _ __   _ __ _ _  __ __   _ __   __ _

## 2021-01-03 NOTE — H&P (Signed)
History and Physical    Donald Harrington:[id number] DOB: [birth year] DOA: 01/03/2021  PCP: No primary care provider on file.  Patient coming from: Home  I have personally briefly reviewed patient's old medical records in [place]  Chief Complaint: Headache, strokes on MRI  HPI: Donald Harrington is a 65 y.o. male with medical history significant of Endocarditis in 2015 causing MI and embolic stroke.  Pt underwent AVR and MVR in 2015 with bioprosthetic valves.  Pt had PAF thereafter and has been on coumadin.  Pt also has h/o PUD causing GIB in 2015.  Pt presents to ED at [facility] today after MRI done as outpt for vague headache complaints that have been ongoing for some time demonstrated multifocal acute embolic strokes. <1cm in size in various parts of brain.  Pt has also had dark stools "just recently".  Was on steroid taper last month for cervical disk dz.  Looks like hes not on chronic steroids though.  No fevers, chills, emesis.  Denies h/o known liver disease.   ED Course: HGB 9.0 down from 16 in summer of 2021, apparently had HGB 10.3 at PCP office earlier this month.  Stool heme positive.  Pt started on protonix gtt, NS at 125, and octreotide.  INR only 1.3 today.  Vitals were reportedly stable in ED: looks like HR 99, BP 110/67.  Pt transferred to [facility].  While in ambulance: pt reportedly given ativan which reportedly dropped BP.  Here at [facility]: HR 112 (S tach), BP 91/58.  New wet sounding cough / coarse breath sounds as well.   Review of Systems: As per HPI, otherwise all review of systems negative.  Past Medical History:  Diagnosis Date  . Alcohol abuse   . Bladder cancer ([place])    remission s/p TURBT  . CAD (coronary artery disease), native coronary artery    MI in setting of endocarditis  . 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 Endocarditis    Mitral and Aortic valve 2015  . HTN (hypertension)   . Hypothyroidism   . NICM (nonischemic  cardiomyopathy) ([place])    Normal EF as of 2020 echo  . PAF (paroxysmal atrial fibrillation) (HCC)    coumadin anticoagulation due to h/o valve replacements with bioprosthetic valves  . PUD (peptic ulcer disease)    causing GIB in 2015    Past Surgical History:  Procedure Laterality Date  . AORTIC VALVE REPLACEMENT  04/2014   bioprosthetic  . LAPAROSCOPIC APPENDECTOMY  2021  . 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 TRANSURETHRAL RESECTION OF BLADDER TUMOR       reports that he has been smoking. He has a 40.00 pack-year smoking history. He has never used smokeless tobacco. He reports previous alcohol use. He reports that he does not use drugs.  No Known Allergies  Family History  Problem Relation Age of Onset  . Thyroid disease Sister      Prior to Admission medications   Not on File    Physical Exam: Vitals:   01/03/21 2004  BP: (!) 91/58  Pulse: (!) 112  Resp: 18  Temp: 99.7 F (37.6 C)  TempSrc: Axillary  SpO2: 100%    Constitutional: NAD, calm, comfortable Eyes: PERRL, lids and conjunctivae normal ENMT: Mucous membranes are moist. Posterior pharynx clear of any exudate or lesions.Normal dentition.  Neck: normal, supple, no masses, no thyromegaly Respiratory: Wet sounding cough and coarse breath sounds. Cardiovascular: Tachycardic. Abdomen: no tenderness, no  masses palpated. No hepatosplenomegaly. Bowel sounds positive.  Musculoskeletal: no clubbing / cyanosis. No joint deformity upper and lower extremities. Good ROM, no contractures. Normal muscle tone.  Skin: no rashes, lesions, ulcers. No induration Neurologic: CN 2-12 grossly intact. Sensation intact, DTR normal. Strength 5/5 in all 4.  Psychiatric: Normal judgment and insight. Alert and oriented x 3. Normal mood.    Labs on Admission: I have personally reviewed following labs and imaging studies  CBC: No results for input(s): WBC, NEUTROABS, HGB, HCT, MCV, PLT in the last 168  hours. Basic Metabolic Panel: No results for input(s): NA, K, CL, CO2, GLUCOSE, BUN, CREATININE, CALCIUM, MG, PHOS in the last 168 hours. GFR: CrCl cannot be calculated (No successful lab value found.). Liver Function Tests: No results for input(s): AST, ALT, ALKPHOS, BILITOT, PROT, ALBUMIN in the last 168 hours. No results for input(s): LIPASE, AMYLASE in the last 168 hours. No results for input(s): AMMONIA in the last 168 hours. Coagulation Profile: No results for input(s): INR, PROTIME in the last 168 hours. Cardiac Enzymes: No results for input(s): CKTOTAL, CKMB, CKMBINDEX, TROPONINI in the last 168 hours. BNP (last 3 results) No results for input(s): PROBNP in the last 8760 hours. HbA1C: No results for input(s): HGBA1C in the last 72 hours. CBG: No results for input(s): GLUCAP in the last 168 hours. Lipid Profile: No results for input(s): CHOL, HDL, LDLCALC, TRIG, CHOLHDL, LDLDIRECT in the last 72 hours. Thyroid Function Tests: No results for input(s): TSH, T4TOTAL, FREET4, T3FREE, THYROIDAB in the last 72 hours. Anemia Panel: No results for input(s): VITAMINB12, FOLATE, FERRITIN, TIBC, IRON, RETICCTPCT in the last 72 hours. Urine analysis: No results found for: COLORURINE, APPEARANCEUR, LABSPEC, PHURINE, GLUCOSEU, HGBUR, BILIRUBINUR, KETONESUR, PROTEINUR, UROBILINOGEN, NITRITE, LEUKOCYTESUR  Radiological Exams on Admission: No results found.  EKG: Independently reviewed.  Assessment/Plan Principal Problem:   Acute blood loss anemia Active Problems:   Acute embolic stroke (HCC)   GI bleed   Cough   PAF (paroxysmal atrial fibrillation) (HCC)   History of aortic valve replacement   History of mitral valve replacement    1. ABLA due to GIB - 1. Holding coumadin 2. INR is 1.3 already 3. HGB 9.0 at [facility] 4. Will go down from dilution if nothing else (due to IVF being used to treat hypotension and S. Tach) 5. Repeat H/H at [facility] 6. Repeat CBC in AM 7. Type and screen  transfuse if needed 8. Message sent to GI for AM consult 9. NPO 10. Cont PPI gtt 11. don't see that he needs octreotide at this time. 2. Hypotension and S. Tach - 1. Not clear if this due to GIB vs just the ativan he was given in ambulance 2. 3L IVF thus far now given 3. HGB today of 9.0 is only slightly down from the 10.3 he had earlier this month which would suggest a more slow GIB 4. No hematemesis 3. Acute embolic strokes - 1. Unfortunately, likely due to INR being 1.3, patient is having acute embolic strokes, presumably cardio embolic with multiple bilateral infarcts on MRI as described by the tele neuro consult note in chart from [facility]. 2. Stroke pathway 3. Check FLP and A1C 4. Check 2d echo 5. Will defer to neurology if MRAs even needed at the moment. 6. No blood thinners or antiplatelets at the moment due to GIB 7. Coumadin on hold. 4. Wet sounding cough - 1. COVID neg at [facility] 2. WBC nl 3. Getting CXR now 5. HTN - 1. Med  rec pending 2. But obviously will hold all HTN meds in setting of hypotension, tachycardia, and acute strokes. 6. PAF - 1. Med rec pending 2. Coumadin on hold  DVT prophylaxis: SCDs Code Status: Full Family Communication: No family in room Disposition Plan: TBD Consults called: Dr. Lorrin Goodell, Message also sent to Dr. Henrene Pastor for GI consult in AM Admission status: Admit to inpatient  Severity of Illness: The appropriate patient status for this patient is INPATIENT. Inpatient status is judged to be reasonable and necessary in order to provide the required intensity of service to ensure the patient's safety. The patient's presenting symptoms, physical exam findings, and initial radiographic and laboratory data in the context of their chronic comorbidities is felt to place them at high risk for further clinical deterioration. Furthermore, it is not anticipated that the patient will be medically stable for discharge from the hospital within 2 midnights of admission.  The following factors support the patient status of inpatient.   IP status due to: 1) acute embolic strokes 2) acute GIB   * I certify that at the point of admission it is my clinical judgment that the patient will require inpatient hospital care spanning beyond 2 midnights from the point of admission due to high intensity of service, high risk for further deterioration and high frequency of surveillance required.*    Rya Rausch M. DO [facility]  How to contact the [facility] Attending or Consulting provider [place] or covering provider during after hours [place], for this patient?  1. Check the care team in [facility] and look for a) attending/consulting [facility] provider listed and b) the [facility] team listed 2. Log into [email or web address]  Amion Physician Scheduling and messaging for groups and whole hospitals  On call and physician scheduling software for group practices, residents, hospitalists and other medical providers for call, clinic, rotation and shift schedules. On[employer] is a hospital-wide system for scheduling doctors and paging doctors on call. EasyPlot is for scientific plotting and data analysis.  [email or web address]  and use [place]'s universal password to access. If you do not have the password, please contact the hospital operator.  3. Locate the [facility] provider you are looking for under [facility] and page to a number that you can be directly reached. 4. If you still have difficulty reaching the provider, please page the [facility] (Director on Call) for the Hospitalists listed on amion for assistance.  01/03/2021, 9:01 PM

## 2021-01-04 ENCOUNTER — Inpatient Hospital Stay (HOSPITAL_COMMUNITY): Payer: Medicare HMO

## 2021-01-04 DIAGNOSIS — I639 Cerebral infarction, unspecified: Secondary | ICD-10-CM | POA: Diagnosis not present

## 2021-01-04 DIAGNOSIS — D62 Acute posthemorrhagic anemia: Secondary | ICD-10-CM | POA: Diagnosis not present

## 2021-01-04 DIAGNOSIS — K922 Gastrointestinal hemorrhage, unspecified: Secondary | ICD-10-CM | POA: Diagnosis not present

## 2021-01-04 DIAGNOSIS — Z952 Presence of prosthetic heart valve: Secondary | ICD-10-CM | POA: Diagnosis not present

## 2021-01-04 LAB — COMPREHENSIVE METABOLIC PANEL
ALT: 17 U/L (ref 0–44)
AST: 17 U/L (ref 15–41)
Albumin: 2.4 g/dL — ABNORMAL LOW (ref 3.5–5.0)
Alkaline Phosphatase: 42 U/L (ref 38–126)
Anion gap: 10 (ref 5–15)
BUN: 15 mg/dL (ref 8–23)
CO2: 24 mmol/L (ref 22–32)
Calcium: 8.1 mg/dL — ABNORMAL LOW (ref 8.9–10.3)
Chloride: 103 mmol/L (ref 98–111)
Creatinine, Ser: 1.29 mg/dL — ABNORMAL HIGH (ref 0.61–1.24)
GFR, Estimated: >60 mL/min (ref >60)
Glucose, Bld: 97 mg/dL (ref 70–99)
Potassium: 4.5 mmol/L (ref 3.5–5.1)
Sodium: 137 mmol/L (ref 135–145)
Total Bilirubin: 1.2 mg/dL (ref 0.3–1.2)
Total Protein: 5.2 g/dL — ABNORMAL LOW (ref 6.5–8.1)

## 2021-01-04 LAB — CBC
HCT: 21.4 % — ABNORMAL LOW (ref 39.0–52.0)
Hemoglobin: 7 g/dL — ABNORMAL LOW (ref 13.0–17.0)
MCH: 34.7 pg — ABNORMAL HIGH (ref 26.0–34.0)
MCHC: 32.7 g/dL (ref 30.0–36.0)
MCV: 105.9 fL — ABNORMAL HIGH (ref 80.0–100.0)
Platelets: 125 10*3/uL — ABNORMAL LOW (ref 150–400)
RBC: 2.02 MIL/uL — ABNORMAL LOW (ref 4.22–5.81)
RDW: 17.3 % — ABNORMAL HIGH (ref 11.5–15.5)
WBC: 3.5 10*3/uL — ABNORMAL LOW (ref 4.0–10.5)
nRBC: 0 % (ref 0.0–0.2)

## 2021-01-04 LAB — PREPARE RBC (CROSSMATCH)

## 2021-01-04 LAB — LIPID PANEL
Cholesterol: 99 mg/dL (ref 0–200)
HDL: 16 mg/dL — ABNORMAL LOW (ref >40)
LDL Cholesterol: 57 mg/dL (ref 0–99)
Total CHOL/HDL Ratio: 6.2 RATIO
Triglycerides: 132 mg/dL (ref <150)
VLDL: 26 mg/dL (ref 0–40)

## 2021-01-04 LAB — RAPID URINE DRUG SCREEN, HOSP PERFORMED
Amphetamines: NOT DETECTED
Barbiturates: NOT DETECTED
Benzodiazepines: NOT DETECTED
Cocaine: NOT DETECTED
Opiates: NOT DETECTED
Tetrahydrocannabinol: NOT DETECTED

## 2021-01-04 LAB — HEMOGLOBIN A1C
Hgb A1c MFr Bld: 4.1 % — ABNORMAL LOW (ref 4.8–5.6)
Mean Plasma Glucose: 70.97 mg/dL

## 2021-01-04 LAB — HEMOGLOBIN AND HEMATOCRIT, BLOOD
HCT: 24 % — ABNORMAL LOW (ref 39.0–52.0)
Hemoglobin: 7.8 g/dL — ABNORMAL LOW (ref 13.0–17.0)

## 2021-01-04 LAB — HIV ANTIBODY (ROUTINE TESTING W REFLEX): HIV Screen 4th Generation wRfx: NONREACTIVE

## 2021-01-04 LAB — URINALYSIS, ROUTINE W REFLEX MICROSCOPIC
Bacteria, UA: NONE SEEN
Bilirubin Urine: NEGATIVE
Glucose, UA: NEGATIVE mg/dL
Ketones, ur: NEGATIVE mg/dL
Leukocytes,Ua: NEGATIVE
Nitrite: NEGATIVE
Protein, ur: NEGATIVE mg/dL
Specific Gravity, Urine: 1.012 (ref 1.005–1.030)
pH: 5 (ref 5.0–8.0)

## 2021-01-04 LAB — BRAIN NATRIURETIC PEPTIDE: B Natriuretic Peptide: 648 pg/mL — ABNORMAL HIGH (ref 0.0–100.0)

## 2021-01-04 LAB — ABO/RH: ABO/RH(D): O POS

## 2021-01-04 IMAGING — MR MR MRA HEAD W/O CM
1 series · 20 of 48 positions shown · non-contrast
Comparison: MRI yesterday.

CLINICAL DATA: Multiple embolic strokes.

EXAM:
MRA HEAD WITHOUT CONTRAST
TECHNIQUE: Angiographic images of the Circle of Willis were obtained using MRA
technique without intravenous contrast.

[Series 5: 3d cow · axial · 0.5mm · 0.41mm/px · z∈[-104,-24]mm · 20 of 172 slices shown]
[im 1/172]
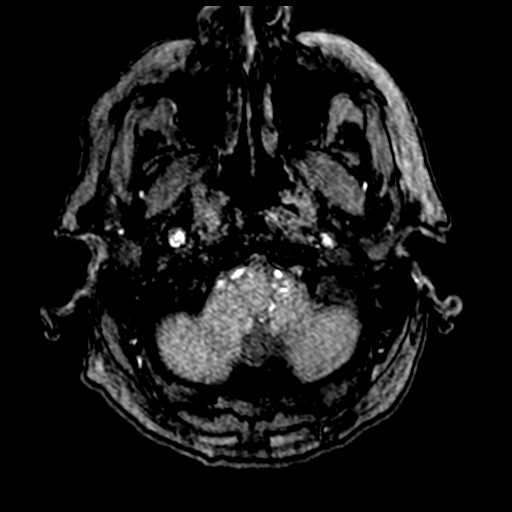
[im 4/172]
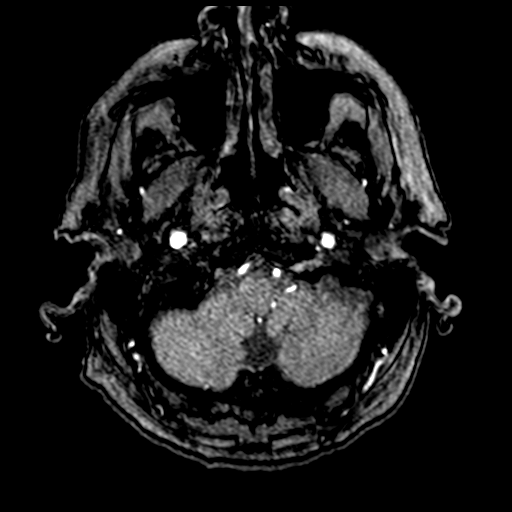
[im 8/172]
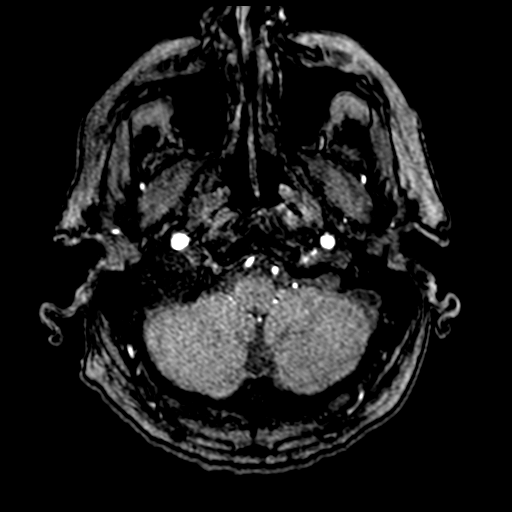
[im 11/172]
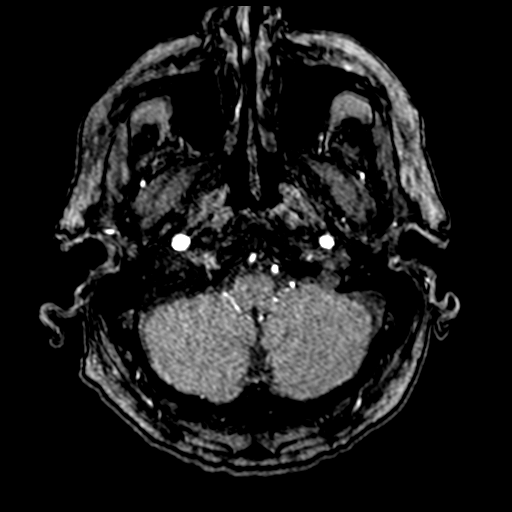
[im 15/172]
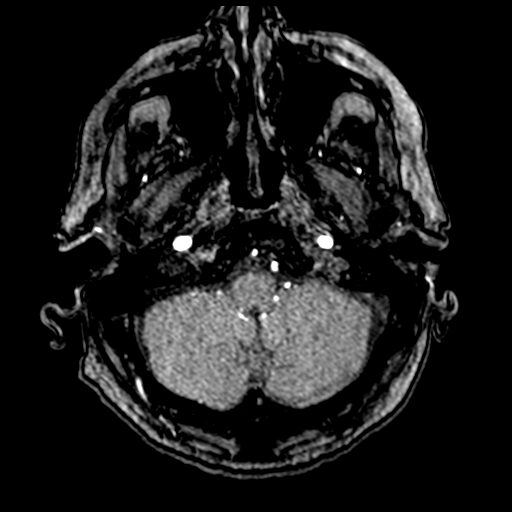
[im 19/172]
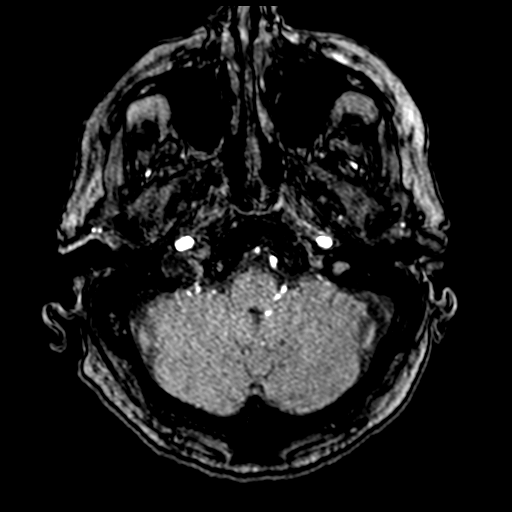
[im 22/172]
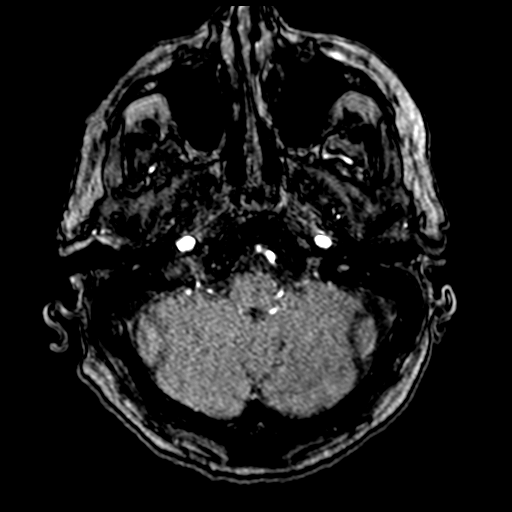
[im 26/172]
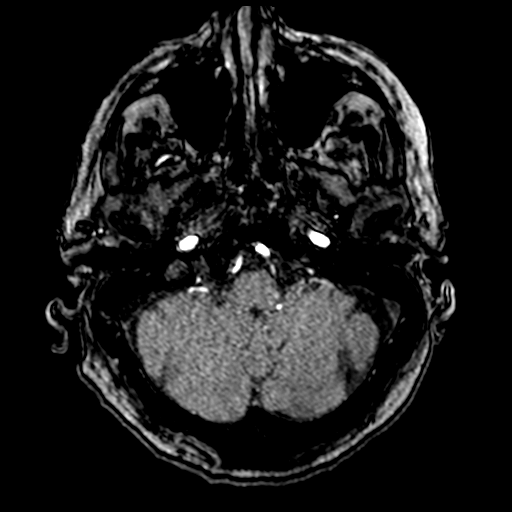
[im 30/172]
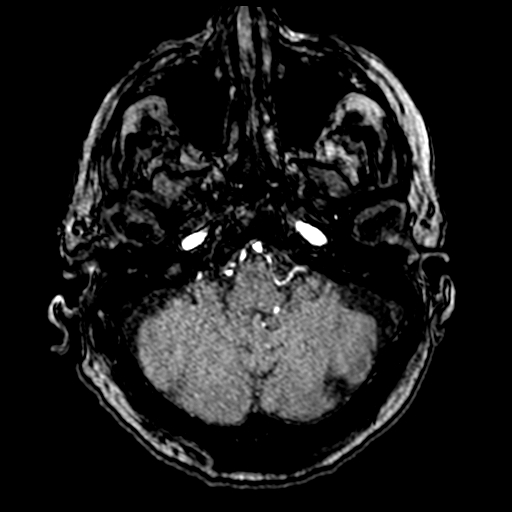
[im 33/172]
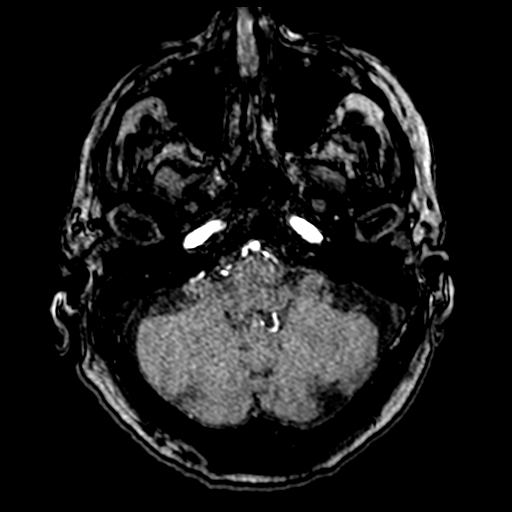
[im 37/172]
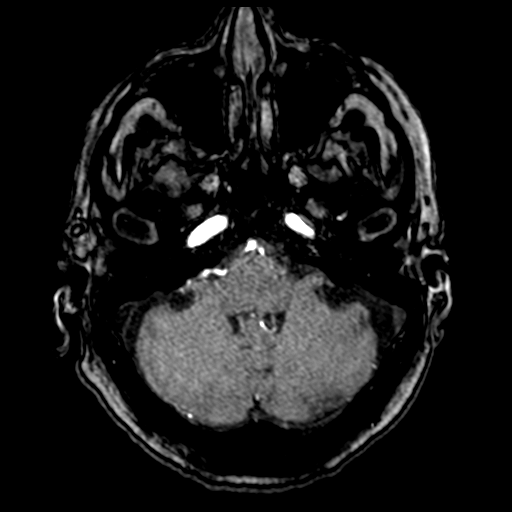
[im 41/172]
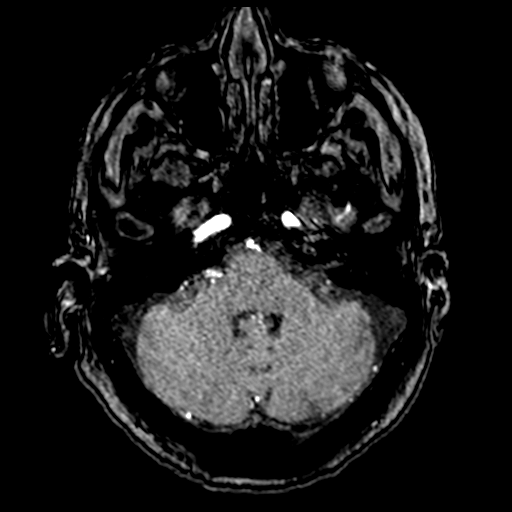
[im 55/172]
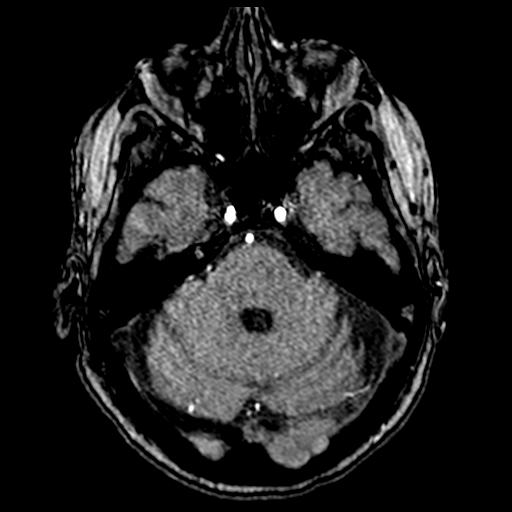
[im 77/172]
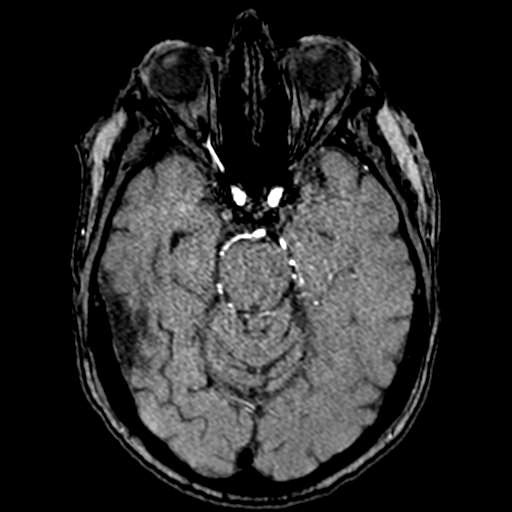
[im 88/172]
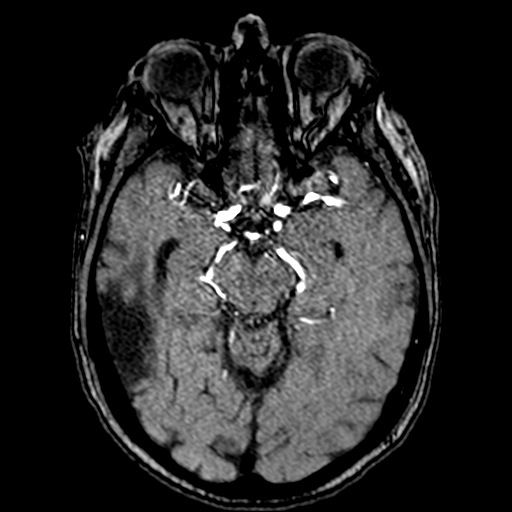
[im 99/172]
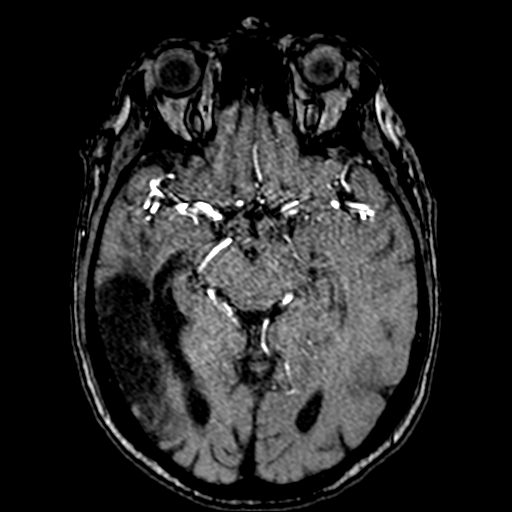
[im 121/172]
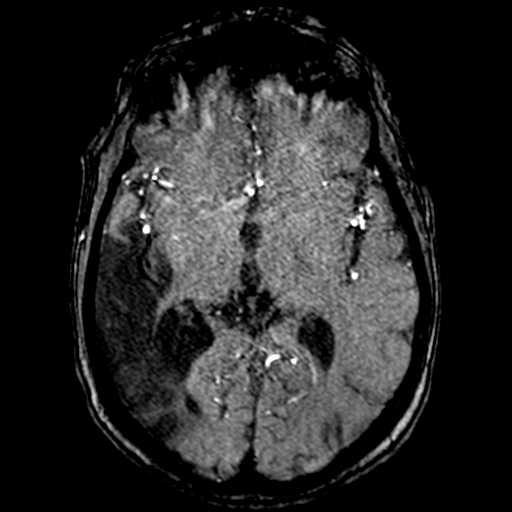
[im 142/172]
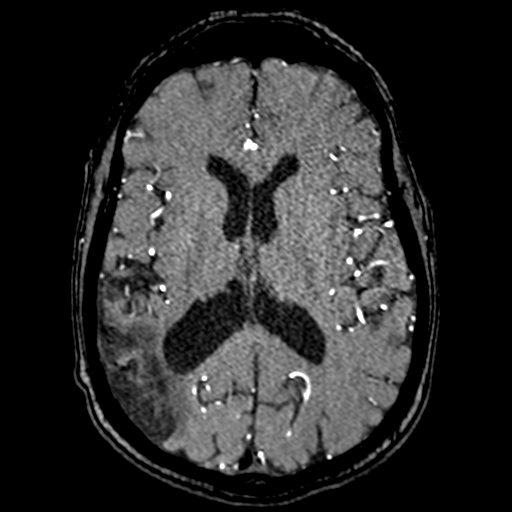
[im 146/172]
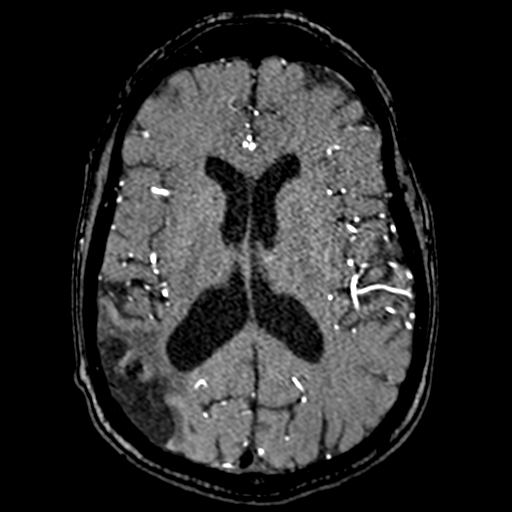
[im 164/172]
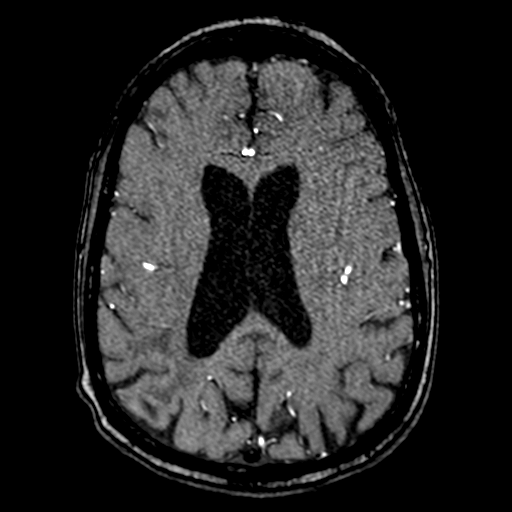

[20 of 48 positions shown; findings below may reference images not displayed]

FINDINGS: Both internal carotid arteries are patent through the skull base and
siphon regions. The right ICA supplies the right middle cerebral
artery territory and both anterior cerebral artery territories. No
evidence of large vessel occlusion. No correctable proximal
stenosis. Left internal carotid artery supplies the left middle
cerebral artery territory and the left PCA. No large vessel
occlusion.

Both vertebral arteries are patent to the basilar. No basilar
stenosis. Posterior circulation branch vessels show flow. Left PCA
takes fetal origin from the anterior circulation, as noted above.
IMPRESSION: No large or medium vessel occlusion or correctable proximal
stenosis.

## 2021-01-04 MED ORDER — BISACODYL 5 MG PO TBEC: 20.0000 mg | DELAYED_RELEASE_TABLET | Freq: Once | ORAL | Status: DC

## 2021-01-04 MED ORDER — PEG-KCL-NACL-NASULF-NA ASC-C 100 G PO SOLR: 1.0000 | Freq: Once | ORAL | Status: DC

## 2021-01-04 MED ORDER — TRAMADOL HCL 50 MG PO TABS
50.0000 mg | ORAL_TABLET | Freq: Two times a day (BID) | ORAL | Status: DC | PRN
  Administered 2021-01-04 – 2021-01-20 (×20): 50 mg via ORAL
  Filled 2021-01-04 (×21): qty 1

## 2021-01-04 MED ORDER — SODIUM CHLORIDE 0.9% IV SOLUTION: Freq: Once | INTRAVENOUS | Status: AC

## 2021-01-04 MED ORDER — DIGOXIN 0.25 MG/ML IJ SOLN
0.1250 mg | Freq: Once | INTRAMUSCULAR | Status: AC
  Administered 2021-01-04: 0.125 mg via INTRAVENOUS
  Filled 2021-01-04: qty 0.5

## 2021-01-04 MED ORDER — PEG-KCL-NACL-NASULF-NA ASC-C 100 G PO SOLR
0.5000 | Freq: Once | ORAL | Status: AC
  Administered 2021-01-04: 100 g via ORAL

## 2021-01-04 MED ORDER — PANTOPRAZOLE SODIUM 40 MG IV SOLR
40.0000 mg | Freq: Two times a day (BID) | INTRAVENOUS | Status: DC
  Administered 2021-01-04 – 2021-01-12 (×16): 40 mg via INTRAVENOUS
  Filled 2021-01-04 (×17): qty 40

## 2021-01-04 MED ORDER — PEG-KCL-NACL-NASULF-NA ASC-C 100 G PO SOLR
0.5000 | Freq: Once | ORAL | Status: AC
  Administered 2021-01-04: 100 g via ORAL
  Filled 2021-01-04: qty 1

## 2021-01-04 NOTE — Progress Notes (Signed)
PT Cancellation Note  Patient Details Name: Donald Harrington MRN: [id number] DOB: 31-Jul-1956   Cancelled Treatment:    Reason Eval/Treat Not Completed: Patient at procedure or test/unavailable. Pt off floor getting MRA. Will follow-up as time allows.   Moishe Spice, PT, DPT Acute Rehabilitation Services  Pager: [phone] Office: [place] 01/04/2021, 4:14 PM

## 2021-01-04 NOTE — Progress Notes (Signed)
OT Cancellation Note  Patient Details Name: Donald Harrington MRN: [id number] DOB: [birth year]   Cancelled Treatment:    Reason Eval/Treat Not Completed: Patient at procedure or test/ unavailable.  Pt in radiology.  Will reattempt.  Nilsa Nutting., OTR/L Acute Rehabilitation Services Pager [phone] Office [phone]   Lucille Passy M 01/04/2021, 4:30 PM

## 2021-01-04 NOTE — Progress Notes (Signed)
VASCULAR LAB    Carotid duplex has been performed.  See CV proc for preliminary results.   Orvella Digiulio, RVT 01/04/2021, 5:59 PM

## 2021-01-04 NOTE — Progress Notes (Signed)
Pt refuses care, refused vital signs to be checked. Refused NS to be connected constantly stating "leave me alone I want go back to [place]". Refused lab to be drawn, stating, "leave me alone, I want go back to [place]." Notified admitting physician

## 2021-01-04 NOTE — Progress Notes (Signed)
PT Cancellation Note  Patient Details Name: Deantae Shackleton MRN: [id number] DOB: [birth year]   Cancelled Treatment:    Reason Eval/Treat Not Completed: Medical issues which prohibited therapy. RN requesting hold on PT until pt finishes receiving blood later today. RN reports his HR was elevated also. Will follow-up later in day.  Moishe Spice, PT, DPT Acute Rehabilitation Services  Pager: [phone] Office: [place] 01/04/2021, 9:47 AM

## 2021-01-04 NOTE — Consult Note (Signed)
[facility] Consult: 9:51 AM 01/04/2021  LOS: 1 day    Referring Provider: Dr Cruzita Lederer  Primary Care Physician: ?[facility] as this is where he goes for his anticoagulation visits, where his cardiologist is located. Primary Gastroenterologist: unassigned     Reason for Consultation: Anemia.  Heme positive stool.   HPI: Donald Harrington is a 65 y.o. male.  PMH listed below.  endocarditis 2015, status post bioprosthetic mitral and aortic valves..  Paroxysmal A. fib.  Nonischemic, alcoholic cardiomyopathy with normal EF on echo 2020.  Embolic CVAs, on warfarin.  Previous alcohol abuse, quit 2017.  Degenerative spine disease, spinal stenosis..  Hip avascular necrosis. Appendicitis, appendectomy at [facility] in June 2021. GI bleed, possibly from ulcer disease in 2015.  Had blood loss anemia requiring transfusion at that time.  Previous endoscopy performed either in [place], [facility] or [facility], patient is not a great historian. Fat at the falciform ligament but smooth liver contour on CT scan in 03/2020 Course of steroids taper last month for cervical disc disease.  Transfer from outside hospital yesterday with embolic strokes, anemia, dark stool.  INR 1.3.  Blood pressure 91/58 at arrival.  Hgb 9 compared w 11.4 on 2/17.. Hgb here early this morning was 7 with MCV 105.  Platelets 125.  FOBT positive. T bili, alk phos, transaminases normal.  Creatinine elevated at 1.2 with normal BUN. Mild pulmonary vascular congestion on chest x-ray  Patient takes up to 3 Goody powders daily for the last few weeks to deal with pain in his head and neck.  No nausea or vomiting.  Good appetite.  No black stools but has seen some blood when he wipes up after bowel movements.  Weight  stable. No current alcohol use. Resides in [place].    Past Medical History:  Diagnosis Date  . Alcohol abuse   . Bladder cancer ([place])    remission s/p TURBT  . CAD (coronary artery disease), native coronary artery    MI in setting of endocarditis  . 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 Endocarditis    Mitral and Aortic valve 2015  . HTN (hypertension)   . Hypothyroidism   . NICM (nonischemic cardiomyopathy) ([place])    Normal EF as of 2020 echo  . PAF (paroxysmal atrial fibrillation) (HCC)    coumadin anticoagulation due to h/o valve replacements with bioprosthetic valves  . PUD (peptic ulcer disease)    causing GIB in 2015    Past Surgical History:  Procedure Laterality Date  . AORTIC VALVE REPLACEMENT  04/2014   bioprosthetic  . LAPAROSCOPIC APPENDECTOMY  2021  . 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 TRANSURETHRAL RESECTION OF BLADDER TUMOR      Prior to Admission medications   Not on File    Scheduled Meds: . [START ON 01/07/2021] pantoprazole  40 mg Intravenous Q12H   Infusions: . sodium chloride 50 mL/hr at 01/04/21 0600  . pantoprozole (PROTONIX) infusion 8 mg/hr (01/03/21 2123)  . pantoprazole (PROTONIX)  80 mg IVPB     PRN Meds: acetaminophen **OR** acetaminophen, ondansetron **OR** ondansetron (ZOFRAN) IV   Allergies as of 01/03/2021  . (No Known Allergies)    Family History  Problem Relation Age of Onset  . Thyroid disease Sister     Social History   Socioeconomic History  . Marital status: Not on file    Spouse name: Not on file  . Number of children: Not on file  . Years of education: Not on file  . Highest education level: Not on file  Occupational History  . Not on file  Tobacco Use  . Smoking status: Current Every Day Smoker    Packs/day: 1.00    Years: 40.00    Pack years: 40.00  . Smokeless tobacco: Never Used  Substance and Sexual Activity  . Alcohol use: Not Currently    Comment: last 2017  . Drug  use: Never  . Sexual activity: Not on file  Other Topics Concern  . Not on file  Social History Narrative  . Not on file   Social Determinants of Health   Financial Resource Strain: Not on file  Food Insecurity: Not on file  Transportation Needs: Not on file  Physical Activity: Not on file  Stress: Not on file  Social Connections: Not on file  Intimate Partner Violence: Not on file    REVIEW OF SYSTEMS: Constitutional: No profound fatigue or weakness ENT:  No nose bleeds Pulm: Difficulty breathing or cough CV:  No palpitations, no LE edema.  No angina GU:  No hematuria, no frequency GI: No dysphagia.  See HPI. Heme: Denies excessive bleeding.  Bruises easily. Transfusions: Transfused in 2015 at time of GI bleed. Neuro: Headaches and head pain.  Neck pain.  no peripheral tingling or numbness Derm:  No itching, no rash or sores.  Endocrine:  No sweats or chills.  No polyuria or dysuria Immunization: Not queried. Travel:  None beyond local counties in last few months.    PHYSICAL EXAM: Vital signs in last 24 hours: Vitals:   01/04/21 0841 01/04/21 0909  BP: 92/62 (!) 91/58  Pulse: (!) 116 (!) 107  Resp:  19  Temp: (!) 100.7 F (38.2 C) (!) 101.3 F (38.5 C)  SpO2: 100% 97%   Wt Readings from Last 3 Encounters:  No data found for Wt    General: Unwell looking, unkempt.  Slow speech.  Comfortable.  No distress. Head: No facial asymmetry or swelling.  No signs of head trauma. Eyes: No conjunctival pallor or scleral icterus.  EOMI Ears: Not hard of hearing Nose: No congestion or discharge Mouth: Patient refused to open his mouth or stick his tongue out but his teeth are in poor repair Neck: No JVD, no masses, no thyromegaly Lungs: Clear bilaterally.  No labored breathing.  No cough Heart: RRR.  S1, S2 audible. Abdomen: Nontender.  Nondistended.  No HSM, masses, bruits, hernias. Rectal: Deferred Musc/Skeltl: no swelling or deformity Extremities:  No CCE   Neurologic:  Oriented x 3.  Moves all 4 limbs.  No tremors.  Strength not tested.  Suppressive aphasia with delayed speech. Skin: No rash, no sores, no telangiectasia.  Purpura on the arms and some bruising on the lower legs Nodes: Cervical adenopathy Psych: Cooperative.  Slightly anxious peer  Intake/Output from previous day: 03/18 0701 - 03/19 0700 In: 61 [I.V.:69] Out: 200 [Urine:200] Intake/Output this shift: Total I/O In: 315 [Blood:315] Out: -   LAB RESULTS: Recent Labs    01/04/21  0322  WBC 3.5*  HGB 7.0*  HCT 21.4*  PLT 125*   BMET Lab Results  Component Value Date   NA 137 01/04/2021   K 4.5 01/04/2021   CL 103 01/04/2021   CO2 24 01/04/2021   GLUCOSE 97 01/04/2021   BUN 15 01/04/2021   CREATININE 1.29 (H) 01/04/2021   CALCIUM 8.1 (L) 01/04/2021   LFT Recent Labs    01/04/21 0322  PROT 5.2*  ALBUMIN 2.4*  AST 17  ALT 17  ALKPHOS 42  BILITOT 1.2   PT/INR No results found for: INR, PROTIME Hepatitis Panel No results for input(s): HEPBSAG, HCVAB, HEPAIGM, HEPBIGM in the last 72 hours. C-Diff No components found for: CDIFF Lipase  No results found for: LIPASE  Drugs of Abuse  No results found for: LABOPIA, COCAINSCRNUR, LABBENZ, AMPHETMU, THCU, LABBARB   RADIOLOGY STUDIES: DG CHEST PORT 1 VIEW  Result Date: 01/03/2021 CLINICAL DATA:  Cough.  Acute blood loss. EXAM: PORTABLE CHEST 1 VIEW COMPARISON:  January 03, 2021 FINDINGS: Stable sternotomy wires and cardiomegaly. The hila and mediastinum are unremarkable. Skin fold over the upper lateral left chest. No pneumothorax. No nodules or masses. Mild interstitial prominence without overt edema. No focal infiltrate. IMPRESSION: Probable mild pulmonary venous congestion. No other acute abnormalities. Electronically Signed   By: Dorise Bullion III M.D   On: 01/03/2021 21:21     IMPRESSION:   *     FOBT positive, macrocytic anemia. Reports of dark stool per notes, patient reports scant blood with  wiping after bowel movements. What sounds like bleeding peptic ulcer disease in 2015.  No previous colonoscopy.  *    Recurrent embolic CVA in patient with paroxysmal A. fib on Coumadin.  Subtherapeutic INR reported at 1.3 at [facility].  Anticoagulation discontinued.  *    reported history of alcohol abuse, not currently active.  Liver texture smooth on CT scan at [facility] in 03/2019, INR not elevated, so low likelihood of cirrhosis. LFTs normal.  *    thrombocytopenia, noncritical.  Platelets 125.  They were 160 in mid February.    PLAN:     *    Continue Protonix.  Do not feel he needs Protonix drip, will change to Protonix 40 mg IV every 12  *    diet clear liquid.  Patient tolerated regular diet last night and this morning but was made n.p.o.  *    Will need colonoscopy and possible EGD as well.  Timing to be determined.   Azucena Freed  01/04/2021, 9:51 AM Phone [phone]

## 2021-01-04 NOTE — Progress Notes (Signed)
PROGRESS NOTE  Donald Harrington LTJ:[id number] DOB: Mar 02, 1956 DOA: 01/03/2021 PCP: No primary care provider on file.   LOS: 1 day   Brief Narrative / Interim history: 65 year old male with history of endocarditis in 0762 complicated by MI, embolic strokes, status post AVR and MVR in 2015 with bioprosthetic valves, paroxysmal A. fib on Coumadin, history of peptic ulcer disease causing GI bleed in 2015 comes into the hospital after an outpatient MRI showed multifocal acute embolic strokes in various parts of the brain.  He is quite tangential on my interview but tells me that he has been also taking Goody's powder for neck pain (cervical disc disease), also reported some dark stools recently.  No chest pain, no abdominal pain, no nausea or vomiting  Subjective / 24h Interval events: Grumpy this morning, wants to go home.  Complains of neck pain, no abdominal pain, no nausea or vomiting.  Assessment & Plan: Principal Problem Acute blood loss anemia, concern for GI bleed-patient reports using Goody's powder for his neck pain, also dark stools.  Continue Protonix.  GI consulted, appreciate input.  Of note, he has a history of peptic ulcer disease -Hemoglobin 7.0, will transfuse unit of packed red blood cells  Active Problems Acute multifocal embolic strokes-this is likely in the setting of subtherapeutic INR and history of A. fib.  He reports that he himself cut down his Coumadin dose and he was eating more broccoli, difficult to get a straight story but tells me he did this because of taking Goody's powder.  Neurology consulted, appreciate input  Hypotension-possibly in the setting of GI bleed, received 3 L of IV fluids thus far.  Blood pressure varying but in the 90s now, asymptomatic.  Paroxysmal A. fib-currently in sinus tach.  Hold anticoagulation due to concern for GI bleed  Essential hypertension-hypotensive, hold medications  Cough-chest x-ray with some mild pulmonary venous congestion.  He  is hypotensive and will not diurese.  Fortunately currently on room air.  Obtain a 2D echo.  Fever-this morning developing a fever, will obtain blood cultures, urinalysis.  Has a history of endocarditis.   Scheduled Meds: . pantoprazole  40 mg Intravenous Q12H   Continuous Infusions: . sodium chloride 50 mL/hr at 01/04/21 0600   PRN Meds:.acetaminophen **OR** acetaminophen, ondansetron **OR** ondansetron (ZOFRAN) IV  Diet Orders (From admission, onward)    Start     Ordered   01/04/21 1041  Diet clear liquid Room service appropriate? Yes; Fluid consistency: Thin  Diet effective now       Question Answer Comment  Room service appropriate? Yes   Fluid consistency: Thin      01/04/21 1040          DVT prophylaxis: SCDs Start: 01/03/21 2051     Code Status: Full Code  Family Communication: No family at bedside  Status is: Inpatient  Remains inpatient appropriate because:Hemodynamically unstable and Inpatient level of care appropriate due to severity of illness   Dispo: The patient is from: Home              Anticipated d/c is to: Home              Patient currently is not medically stable to d/c.   Difficult to place patient No   Level of care: Progressive  Consultants:  Neurology Gastroenterology  Procedures:  2D echo: Pending  Microbiology  Blood cultures   Antimicrobials: none    Objective: Vitals:   01/03/21 2252 01/04/21 0732 01/04/21 2633 01/04/21 3545  BP: (!) 108/58 (!) 80/42 92/62 (!) 91/58  Pulse:  (!) 127 (!) 116 (!) 107  Resp:  16  19  Temp:  99.3 F (37.4 C) (!) 100.7 F (38.2 C) (!) 101.3 F (38.5 C)  TempSrc:  Oral Oral Oral  SpO2:  95% 100% 97%    Intake/Output Summary (Last 24 hours) at 01/04/2021 1053 Last data filed at 01/04/2021 0849 Gross per 24 hour  Intake 384.01 ml  Output 200 ml  Net 184.01 ml   There were no vitals filed for this visit.  Examination:  Constitutional: NAD Eyes: no scleral icterus ENMT: Mucous  membranes are moist.  Neck: normal, supple Respiratory: clear to auscultation bilaterally, no wheezing, no crackles. Normal respiratory effort.  Cardiovascular: Regular rate and rhythm, no murmurs / rubs / gallops. No LE edema. Good peripheral pulses Abdomen: non distended, no tenderness. Bowel sounds positive.  Musculoskeletal: no clubbing / cyanosis.  Skin: no rashes Neurologic: CN 2-12 grossly intact. Strength 5/5 in all 4.    Data Reviewed: I have independently reviewed following labs and imaging studies   CBC: Recent Labs  Lab 01/04/21 0322  WBC 3.5*  HGB 7.0*  HCT 21.4*  MCV 105.9*  PLT 585*   Basic Metabolic Panel: Recent Labs  Lab 01/04/21 0322  NA 137  K 4.5  CL 103  CO2 24  GLUCOSE 97  BUN 15  CREATININE 1.29*  CALCIUM 8.1*   Liver Function Tests: Recent Labs  Lab 01/04/21 0322  AST 17  ALT 17  ALKPHOS 42  BILITOT 1.2  PROT 5.2*  ALBUMIN 2.4*   Coagulation Profile: No results for input(s): INR, PROTIME in the last 168 hours. HbA1C: Recent Labs    01/04/21 0322  HGBA1C 4.1*   CBG: No results for input(s): GLUCAP in the last 168 hours.  No results found for this or any previous visit (from the past 240 hour(s)).   Radiology Studies: DG CHEST PORT 1 VIEW  Result Date: 01/03/2021 CLINICAL DATA:  Cough.  Acute blood loss. EXAM: PORTABLE CHEST 1 VIEW COMPARISON:  January 03, 2021 FINDINGS: Stable sternotomy wires and cardiomegaly. The hila and mediastinum are unremarkable. Skin fold over the upper lateral left chest. No pneumothorax. No nodules or masses. Mild interstitial prominence without overt edema. No focal infiltrate. IMPRESSION: Probable mild pulmonary venous congestion. No other acute abnormalities. Electronically Signed   By: Dorise Bullion III M.D   On: 01/03/2021 21:21    Marzetta Board, MD, PhD [facility]  Between 7 am - 7 pm I am available, please contact me via Amion or Securechat  Between 7 pm - 7 am I am not available,  please contact night coverage MD/APP via Amion

## 2021-01-04 NOTE — Progress Notes (Signed)
Pt called out that he wanted his bed changed because it was wet, the NT went in to change the bedding, Pt refused to roll so that beddings can be tucked under him. NT asked the PT several times to roll over he refused. I went in to help NT to roll patient. Pt became aggressive and started causing stating "When I leave here, I am going to file a complaint."I called repaid response, to report the incidence.

## 2021-01-04 NOTE — H&P (View-Only) (Signed)
[facility] Consult: 9:51 AM 01/04/2021  LOS: 1 day    Referring Provider: Dr Cruzita Lederer  Primary Care Physician: ?[facility] as this is where he goes for his anticoagulation visits, where his cardiologist is located. Primary Gastroenterologist: unassigned     Reason for Consultation: Anemia.  Heme positive stool.   HPI: Donald Harrington is a 65 y.o. male.  PMH listed below.  endocarditis 2015, status post bioprosthetic mitral and aortic valves..  Paroxysmal A. fib.  Nonischemic, alcoholic cardiomyopathy with normal EF on echo 2020.  Embolic CVAs, on warfarin.  Previous alcohol abuse, quit 2017.  Degenerative spine disease, spinal stenosis..  Hip avascular necrosis. Appendicitis, appendectomy at [facility] in June 2021. GI bleed, possibly from ulcer disease in 2015.  Had blood loss anemia requiring transfusion at that time.  Previous endoscopy performed either in [place], [facility] or [facility], patient is not a great historian. Fat at the falciform ligament but smooth liver contour on CT scan in 03/2020 Course of steroids taper last month for cervical disc disease.  Transfer from outside hospital yesterday with embolic strokes, anemia, dark stool.  INR 1.3.  Blood pressure 91/58 at arrival.  Hgb 9 compared w 11.4 on 2/17.. Hgb here early this morning was 7 with MCV 105.  Platelets 125.  FOBT positive. T bili, alk phos, transaminases normal.  Creatinine elevated at 1.2 with normal BUN. Mild pulmonary vascular congestion on chest x-ray  Patient takes up to 3 Goody powders daily for the last few weeks to deal with pain in his head and neck.  No nausea or vomiting.  Good appetite.  No black stools but has seen some blood when he wipes up after bowel movements.  Weight  stable. No current alcohol use. Resides in [place].    Past Medical History:  Diagnosis Date  . Alcohol abuse   . Bladder cancer ([place])    remission s/p TURBT  . CAD (coronary artery disease), native coronary artery    MI in setting of endocarditis  . 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 Endocarditis    Mitral and Aortic valve 2015  . HTN (hypertension)   . Hypothyroidism   . NICM (nonischemic cardiomyopathy) ([place])    Normal EF as of 2020 echo  . PAF (paroxysmal atrial fibrillation) ([facility])    coumadin anticoagulation due to h/o valve replacements with bioprosthetic valves  . PUD (peptic ulcer disease)    causing GIB in 2015    Past Surgical History:  Procedure Laterality Date  . AORTIC VALVE REPLACEMENT  04/2014   bioprosthetic  . LAPAROSCOPIC APPENDECTOMY  2021  . 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 TRANSURETHRAL RESECTION OF BLADDER TUMOR      Prior to Admission medications   Not on File    Scheduled Meds: . [START ON 01/07/2021] pantoprazole  40 mg Intravenous Q12H   Infusions: . sodium chloride 50 mL/hr at 01/04/21 0600  . pantoprozole (PROTONIX) infusion 8 mg/hr (01/03/21 2123)  . pantoprazole (PROTONIX)  80 mg IVPB     PRN Meds: acetaminophen **OR** acetaminophen, ondansetron **OR** ondansetron (ZOFRAN) IV   Allergies as of 01/03/2021  . (No Known Allergies)    Family History  Problem Relation Age of Onset  . Thyroid disease Sister     Social History   Socioeconomic History  . Marital status: Not on file    Spouse name: Not on file  . Number of children: Not on file  . Years of education: Not on file  . Highest education level: Not on file  Occupational History  . Not on file  Tobacco Use  . Smoking status: Current Every Day Smoker    Packs/day: 1.00    Years: 40.00    Pack years: 40.00  . Smokeless tobacco: Never Used  Substance and Sexual Activity  . Alcohol use: Not Currently    Comment: last 2017  . Drug  use: Never  . Sexual activity: Not on file  Other Topics Concern  . Not on file  Social History Narrative  . Not on file   Social Determinants of Health   Financial Resource Strain: Not on file  Food Insecurity: Not on file  Transportation Needs: Not on file  Physical Activity: Not on file  Stress: Not on file  Social Connections: Not on file  Intimate Partner Violence: Not on file    REVIEW OF SYSTEMS: Constitutional: No profound fatigue or weakness ENT:  No nose bleeds Pulm: Difficulty breathing or cough CV:  No palpitations, no LE edema.  No angina GU:  No hematuria, no frequency GI: No dysphagia.  See HPI. Heme: Denies excessive bleeding.  Bruises easily. Transfusions: Transfused in 2015 at time of GI bleed. Neuro: Headaches and head pain.  Neck pain.  no peripheral tingling or numbness Derm:  No itching, no rash or sores.  Endocrine:  No sweats or chills.  No polyuria or dysuria Immunization: Not queried. Travel:  None beyond local counties in last few months.    PHYSICAL EXAM: Vital signs in last 24 hours: Vitals:   01/04/21 0841 01/04/21 0909  BP: 92/62 (!) 91/58  Pulse: (!) 116 (!) 107  Resp:  19  Temp: (!) 100.7 F (38.2 C) (!) 101.3 F (38.5 C)  SpO2: 100% 97%   Wt Readings from Last 3 Encounters:  No data found for Wt    General: Unwell looking, unkempt.  Slow speech.  Comfortable.  No distress. Head: No facial asymmetry or swelling.  No signs of head trauma. Eyes: No conjunctival pallor or scleral icterus.  EOMI Ears: Not hard of hearing Nose: No congestion or discharge Mouth: Patient refused to open his mouth or stick his tongue out but his teeth are in poor repair Neck: No JVD, no masses, no thyromegaly Lungs: Clear bilaterally.  No labored breathing.  No cough Heart: RRR.  S1, S2 audible. Abdomen: Nontender.  Nondistended.  No HSM, masses, bruits, hernias. Rectal: Deferred Musc/Skeltl: no swelling or deformity Extremities:  No CCE   Neurologic:  Oriented x 3.  Moves all 4 limbs.  No tremors.  Strength not tested.  Suppressive aphasia with delayed speech. Skin: No rash, no sores, no telangiectasia.  Purpura on the arms and some bruising on the lower legs Nodes: Cervical adenopathy Psych: Cooperative.  Slightly anxious peer  Intake/Output from previous day: 03/18 0701 - 03/19 0700 In: 56 [I.V.:69] Out: 200 [Urine:200] Intake/Output this shift: Total I/O In: 315 [Blood:315] Out: -   LAB RESULTS: Recent Labs    01/04/21  0322  WBC 3.5*  HGB 7.0*  HCT 21.4*  PLT 125*   BMET Lab Results  Component Value Date   NA 137 01/04/2021   K 4.5 01/04/2021   CL 103 01/04/2021   CO2 24 01/04/2021   GLUCOSE 97 01/04/2021   BUN 15 01/04/2021   CREATININE 1.29 (H) 01/04/2021   CALCIUM 8.1 (L) 01/04/2021   LFT Recent Labs    01/04/21 0322  PROT 5.2*  ALBUMIN 2.4*  AST 17  ALT 17  ALKPHOS 42  BILITOT 1.2   PT/INR No results found for: INR, PROTIME Hepatitis Panel No results for input(s): HEPBSAG, HCVAB, HEPAIGM, HEPBIGM in the last 72 hours. C-Diff No components found for: CDIFF Lipase  No results found for: LIPASE  Drugs of Abuse  No results found for: LABOPIA, COCAINSCRNUR, LABBENZ, AMPHETMU, THCU, LABBARB   RADIOLOGY STUDIES: DG CHEST PORT 1 VIEW  Result Date: 01/03/2021 CLINICAL DATA:  Cough.  Acute blood loss. EXAM: PORTABLE CHEST 1 VIEW COMPARISON:  January 03, 2021 FINDINGS: Stable sternotomy wires and cardiomegaly. The hila and mediastinum are unremarkable. Skin fold over the upper lateral left chest. No pneumothorax. No nodules or masses. Mild interstitial prominence without overt edema. No focal infiltrate. IMPRESSION: Probable mild pulmonary venous congestion. No other acute abnormalities. Electronically Signed   By: Dorise Bullion III M.D   On: 01/03/2021 21:21     IMPRESSION:   *     FOBT positive, macrocytic anemia. Reports of dark stool per notes, patient reports scant blood with  wiping after bowel movements. What sounds like bleeding peptic ulcer disease in 2015.  No previous colonoscopy.  *    Recurrent embolic CVA in patient with paroxysmal A. fib on Coumadin.  Subtherapeutic INR reported at 1.3 at [facility].  Anticoagulation discontinued.  *    reported history of alcohol abuse, not currently active.  Liver texture smooth on CT scan at [facility] in 03/2019, INR not elevated, so low likelihood of cirrhosis. LFTs normal.  *    thrombocytopenia, noncritical.  Platelets 125.  They were 160 in mid February.    PLAN:     *    Continue Protonix.  Do not feel he needs Protonix drip, will change to Protonix 40 mg IV every 12  *    diet clear liquid.  Patient tolerated regular diet last night and this morning but was made n.p.o.  *    Will need colonoscopy and possible EGD as well.  Timing to be determined.   Azucena Freed  01/04/2021, 9:51 AM Phone [phone]

## 2021-01-04 NOTE — Plan of Care (Signed)
  Problem: Education: Goal: Knowledge of secondary prevention will improve Outcome: Progressing Goal: Knowledge of patient specific risk factors addressed and post discharge goals established will improve Outcome: Progressing   

## 2021-01-04 NOTE — Progress Notes (Signed)
Called to bedside as a second sets of hands. Pt agitated and refusing care. Pt saying he wants to file a complaint. He is alert and oriented to person, place, and time. He is refusing treatment. He says he is having 9/10 pain in his neck/back. After pt spoken to, he agreed to take tylenol for his pain. Brief swallow eval done and pt given 650mg  tylenol PO. Pt bed is soaked with urine. Offer to change linen refused at this time. Pt educated to use of call bell and instructed to call if assistance needed. Bedside RN notified of interventions made.

## 2021-01-04 NOTE — Progress Notes (Addendum)
STROKE TEAM PROGRESS NOTE  HPI: Donald Harrington is a 65 y.o. 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 INR of 1.3. Tachycardia(112) and hypotensive(91/58) on arrival with Hb of 9. Baseline Hb is 16. He was started on fluids, PPI, warfarin on hold and we were asked to assist with management of embolic strokes.  INTERVAL HISTORY Significant anemia: Receiving PRBC transfusion for hgb 7.0 in the setting of FOBT+ stools with daily Goody powder use GI planning EGD and colonoscopy for tomorrow  He reports daily headaches for years for which he uses tylenol and Goody powders. Patient is agitated and angry. He does not answer most of my questions or participate well with exam. He refuses to do most things I ask.  When I attempt to discuss stroke diagnosis and plan of care he continuously interrupts. States he only wants to discuss going home.   Vitals:   01/03/21 2004 01/03/21 2252 01/04/21 0732 01/04/21 0841  BP: (!) 91/58 (!) 108/58 (!) 80/42 92/62  Pulse: (!) 112  (!) 127 (!) 116  Resp: 18  16   Temp: 99.7 F (37.6 C)  99.3 F (37.4 C) (!) 100.7 F (38.2 C)  TempSrc: Axillary  Oral Oral  SpO2: 100%  95% 100%   CBC:  Recent Labs  Lab 01/04/21 0322  WBC 3.5*  HGB 7.0*  HCT 21.4*  MCV 105.9*  PLT 397*   Basic Metabolic Panel:  Recent Labs  Lab 01/04/21 0322  NA 137  K 4.5  CL 103  CO2 24  GLUCOSE 97  BUN 15  CREATININE 1.29*  CALCIUM 8.1*   Lipid Panel:  Recent Labs  Lab 01/04/21 0322  CHOL 99  TRIG 132  HDL 16*  CHOLHDL 6.2  VLDL 26  LDLCALC 57   HgbA1c:  Recent Labs  Lab 01/04/21 0322  HGBA1C 4.1*   Urine Drug Screen: No results for input(s): LABOPIA, COCAINSCRNUR, LABBENZ, AMPHETMU, THCU, LABBARB in the last 168 hours.  Alcohol Level No results for input(s): ETH in the last 168  hours.  IMAGING past 24 hours DG CHEST PORT 1 VIEW  Result Date: 01/03/2021 CLINICAL DATA:  Cough.  Acute blood loss. EXAM: PORTABLE CHEST 1 VIEW COMPARISON:  January 03, 2021 FINDINGS: Stable sternotomy wires and cardiomegaly. The hila and mediastinum are unremarkable. Skin fold over the upper lateral left chest. No pneumothorax. No nodules or masses. Mild interstitial prominence without overt edema. No focal infiltrate. IMPRESSION: Probable mild pulmonary venous congestion. No other acute abnormalities. Electronically Signed   By: Dorise Bullion III M.D   On: 01/03/2021 21:21    PHYSICAL EXAM  Temp:  [98.9 F (37.2 C)-101.3 F (38.5 C)] 98.9 F (37.2 C) (03/19 1213) Pulse Rate:  [107-127] 108 (03/19 1213) Resp:  [16-20] 20 (03/19 1213) BP: (80-108)/(42-64) 95/64 (03/19 1213) SpO2:  [95 %-100 %] 95 % (03/19 1213)  General - Well developed, sitting up in bed receiving PRBC transfusion.   Ophthalmologic - fundi not visualized due to noncooperation.  Cardiovascular - Regular rhythm and rate   Skin: warm and dry  Psych: Moderately agitated, pressured speech, argumentative  Mental Status -  Alert, able to state he is in a hospital in [place], knows month and year. When I ask why he is here he states "They are keeping that to themselves".  Language is clear and fluent, He refuses to name or repeat  Attention span and concentration are poor.  Cranial Nerves II - XII - II - Does not cooperate for VF testing, focuses on and tracks examiner on both left and right fields of vision III, IV, VI - Extraocular movements intact. V - Refuses testing VII - Facial movement intact bilaterally with relaxed expression. Refuses smile/grimace.  VIII - Hearing & vestibular intact bilaterally to voice X - Refuses  XI - Refuses XII - Refuses  Motor Strength - The patients strength was normal in all extremities.   Sensory -Refuses    Coordination -unable to formally asess. Using right arm and  hand purposefully to grip and move TV remote.  Crossing legs without difficulty.   Gait and Station - deferred.  ASSESSMENT/PLAN Donald Harrington is a 65 y.o. 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  PAF thereafter and has been on coumadin. Also current every day smoker. Currently presents as a transfer from OSH with embolic strokes on MRI Brain with new anemia and dark stools concerning for a GI bleed.   Stroke:  acute/subacute bilateral cerebral and cerebellar ischemic emboliclacunar CVA likely embolic in the setting of known atrial fibrillation in the setting of subtherapeutic INR  MRI  -Transfer from OSH with embolic strokes on MRI Brain  MRA Pending 2D Echo Pending CXR 3/19 -Probable mild pulmonary venous congestion. No other acute abnormalities   hgb 7.0 today pancytopenia   LDL 57  HgbA1c 4.1  VTE prophylaxis - Active GI bleeding    Diet   Diet NPO time specified     No AC/AP for now in the setting of active GI bleed. When medically appropriate and stable from a bleeding risk standpoint can consider Eliquis instead of warfarin.    Therapy recommendations:  TBD  Disposition:  TBD  Hypertension  Permissive hypertension (OK if < 220/120) but gradually normalize in 5-7 days  Long-term BP goal normotensive  Hyperlipidemia  Home meds:    LDL 57, goal < 70  High intensity statin  Continue statin at discharge  Other Stroke Risk Factors  Advanced Age >/= 42   Cigarette smoker, current every day, advised to stop smoking.   Hx of ETOH abuse in remission  Hx stroke/TIA   Family hx stroke unknown, patient will not cooperate to answer questions  Coronary artery disease  Alcoholic cardiomyopathy. Chronic systolic heart failure   Other Active Problems Unexplained anemia with FOBT+ stools   -history of GI bleed attributed  to peptic ulcer disease in 2015  --GI, Dr. Glyn Ade, following. Planning for EGD/colonoscopy 3/20 -PRBC transfusion today for hgb 7.0  Hospital day # 1 This plan of care was directed by Dr. Stefanie Libel, NP-C, MSN  ATTENDING NOTE: I reviewed above note and agree with the assessment and plan. Pt was seen and examined.   65 year old male with history of A. fib on Coumadin, prosthetic AVR and MVR, CAD, right MCA infarct, alcohol use admitted for headache and left-sided numbness.  MRI showed punctate bilateral cerebral and cerebellar infarcts, embolic shower pattern.  MRA negative.  Carotid Doppler 40 to 59% stenosis bilaterally.  2D echo pending.  LDL 57, A1c 4.1.  However, patient was also found to have hypotension, severe anemia, tachycardia, INR 1.3. Per note, patient baseline hemoglobin 16.0, on admission 9.0, this morning 7.0.  Patient received blood transfusion.  Patient has a history of peptic ulcer disease, currently has dark stool, home medication including Coumadin and is also using Goody powder at home, concerning for GI bleeding, GI consulted for further evaluation. Continue Protonix.  BP on the low side, received IV fluid, and BP improved.  On exam, pt lying in bed, no acute distress, pale but otherwise no neuro deficit, AAOx 3. Patient current stroke likely due to A. fib on Coumadin with subtherapeutic INR.  However, currently severe anemia concerning for GI bleeding, Coumadin on hold.  Recommend holding off anticoagulation for now given GI bleeding, after GI work-up, once cleared by GI, may consider resume anticoagulation when safe.  Given DOACs especially Eliquis have less GI bleeding risk then Coumadin, recommend Eliquis for A. fib and stroke prevention once cleared by GI.  Neurology will sign off. Please call with questions. Pt will follow up with stroke clinic NP at [facility] in about 4 weeks. Thanks for the consult.   Rosalin Hawking, MD PhD Stroke Neurology 01/04/2021 11:53  PM    To contact Stroke Continuity provider, please refer to [email or web address] After hours, contact General Neurology

## 2021-01-05 ENCOUNTER — Inpatient Hospital Stay (HOSPITAL_COMMUNITY): Payer: Medicare HMO | Admitting: Certified Registered Nurse Anesthetist

## 2021-01-05 ENCOUNTER — Inpatient Hospital Stay (HOSPITAL_COMMUNITY): Payer: Medicare HMO

## 2021-01-05 ENCOUNTER — Encounter (HOSPITAL_COMMUNITY): Payer: Self-pay | Admitting: Internal Medicine

## 2021-01-05 ENCOUNTER — Encounter (HOSPITAL_COMMUNITY)
Admission: AD | Disposition: A | Discharge status: Skilled Nursing Facility | Payer: Self-pay | Source: Other Acute Inpatient Hospital | Attending: Internal Medicine

## 2021-01-05 DIAGNOSIS — K253 Acute gastric ulcer without hemorrhage or perforation: Secondary | ICD-10-CM

## 2021-01-05 DIAGNOSIS — R7881 Bacteremia: Secondary | ICD-10-CM | POA: Diagnosis not present

## 2021-01-05 DIAGNOSIS — K269 Duodenal ulcer, unspecified as acute or chronic, without hemorrhage or perforation: Secondary | ICD-10-CM | POA: Diagnosis not present

## 2021-01-05 DIAGNOSIS — K922 Gastrointestinal hemorrhage, unspecified: Secondary | ICD-10-CM | POA: Diagnosis not present

## 2021-01-05 DIAGNOSIS — K3189 Other diseases of stomach and duodenum: Secondary | ICD-10-CM | POA: Diagnosis not present

## 2021-01-05 DIAGNOSIS — D62 Acute posthemorrhagic anemia: Secondary | ICD-10-CM | POA: Diagnosis not present

## 2021-01-05 DIAGNOSIS — B955 Unspecified streptococcus as the cause of diseases classified elsewhere: Secondary | ICD-10-CM

## 2021-01-05 DIAGNOSIS — I480 Paroxysmal atrial fibrillation: Secondary | ICD-10-CM | POA: Diagnosis not present

## 2021-01-05 HISTORY — PX: BIOPSY: SHX5522

## 2021-01-05 HISTORY — PX: ESOPHAGOGASTRODUODENOSCOPY (EGD) WITH PROPOFOL: SHX5813

## 2021-01-05 LAB — BLOOD CULTURE ID PANEL (REFLEXED) - BCID2

## 2021-01-05 LAB — CBC
HCT: 23.2 % — ABNORMAL LOW (ref 39.0–52.0)
Hemoglobin: 7.6 g/dL — ABNORMAL LOW (ref 13.0–17.0)
MCH: 33.5 pg (ref 26.0–34.0)
MCHC: 32.8 g/dL (ref 30.0–36.0)
MCV: 102.2 fL — ABNORMAL HIGH (ref 80.0–100.0)
Platelets: 124 10*3/uL — ABNORMAL LOW (ref 150–400)
RBC: 2.27 MIL/uL — ABNORMAL LOW (ref 4.22–5.81)
RDW: 17.1 % — ABNORMAL HIGH (ref 11.5–15.5)
WBC: 4.1 10*3/uL (ref 4.0–10.5)
nRBC: 0 % (ref 0.0–0.2)

## 2021-01-05 LAB — COMPREHENSIVE METABOLIC PANEL
ALT: 14 U/L (ref 0–44)
AST: 14 U/L — ABNORMAL LOW (ref 15–41)
Albumin: 2.3 g/dL — ABNORMAL LOW (ref 3.5–5.0)
Alkaline Phosphatase: 38 U/L (ref 38–126)
Anion gap: 11 (ref 5–15)
BUN: 15 mg/dL (ref 8–23)
CO2: 21 mmol/L — ABNORMAL LOW (ref 22–32)
Calcium: 7.8 mg/dL — ABNORMAL LOW (ref 8.9–10.3)
Chloride: 105 mmol/L (ref 98–111)
Creatinine, Ser: 1.11 mg/dL (ref 0.61–1.24)
GFR, Estimated: >60 mL/min (ref >60)
Glucose, Bld: 90 mg/dL (ref 70–99)
Potassium: 3.6 mmol/L (ref 3.5–5.1)
Sodium: 137 mmol/L (ref 135–145)
Total Bilirubin: 0.9 mg/dL (ref 0.3–1.2)
Total Protein: 4.9 g/dL — ABNORMAL LOW (ref 6.5–8.1)

## 2021-01-05 LAB — RESP PANEL BY RT-PCR (FLU A&B, COVID) ARPGX2
Influenza A by PCR: NEGATIVE
Influenza B by PCR: NEGATIVE
SARS Coronavirus 2 by RT PCR: NEGATIVE

## 2021-01-05 LAB — PHOSPHORUS: Phosphorus: 3 mg/dL (ref 2.5–4.6)

## 2021-01-05 LAB — PREPARE RBC (CROSSMATCH)

## 2021-01-05 LAB — PROTIME-INR
INR: 1.5 — ABNORMAL HIGH (ref 0.8–1.2)
Prothrombin Time: 17.3 seconds — ABNORMAL HIGH (ref 11.4–15.2)

## 2021-01-05 LAB — MAGNESIUM: Magnesium: 2 mg/dL (ref 1.7–2.4)

## 2021-01-05 IMAGING — DX DG CHEST 1V PORT
1 series · 2 of 2 positions shown · non-contrast
Comparison: [DATE]

CLINICAL DATA: Shortness of breath

EXAM:
PORTABLE CHEST 1 VIEW

[Series 1: chest · 0.14mm/px · 2 of 2 slices shown]
[im 1/2]
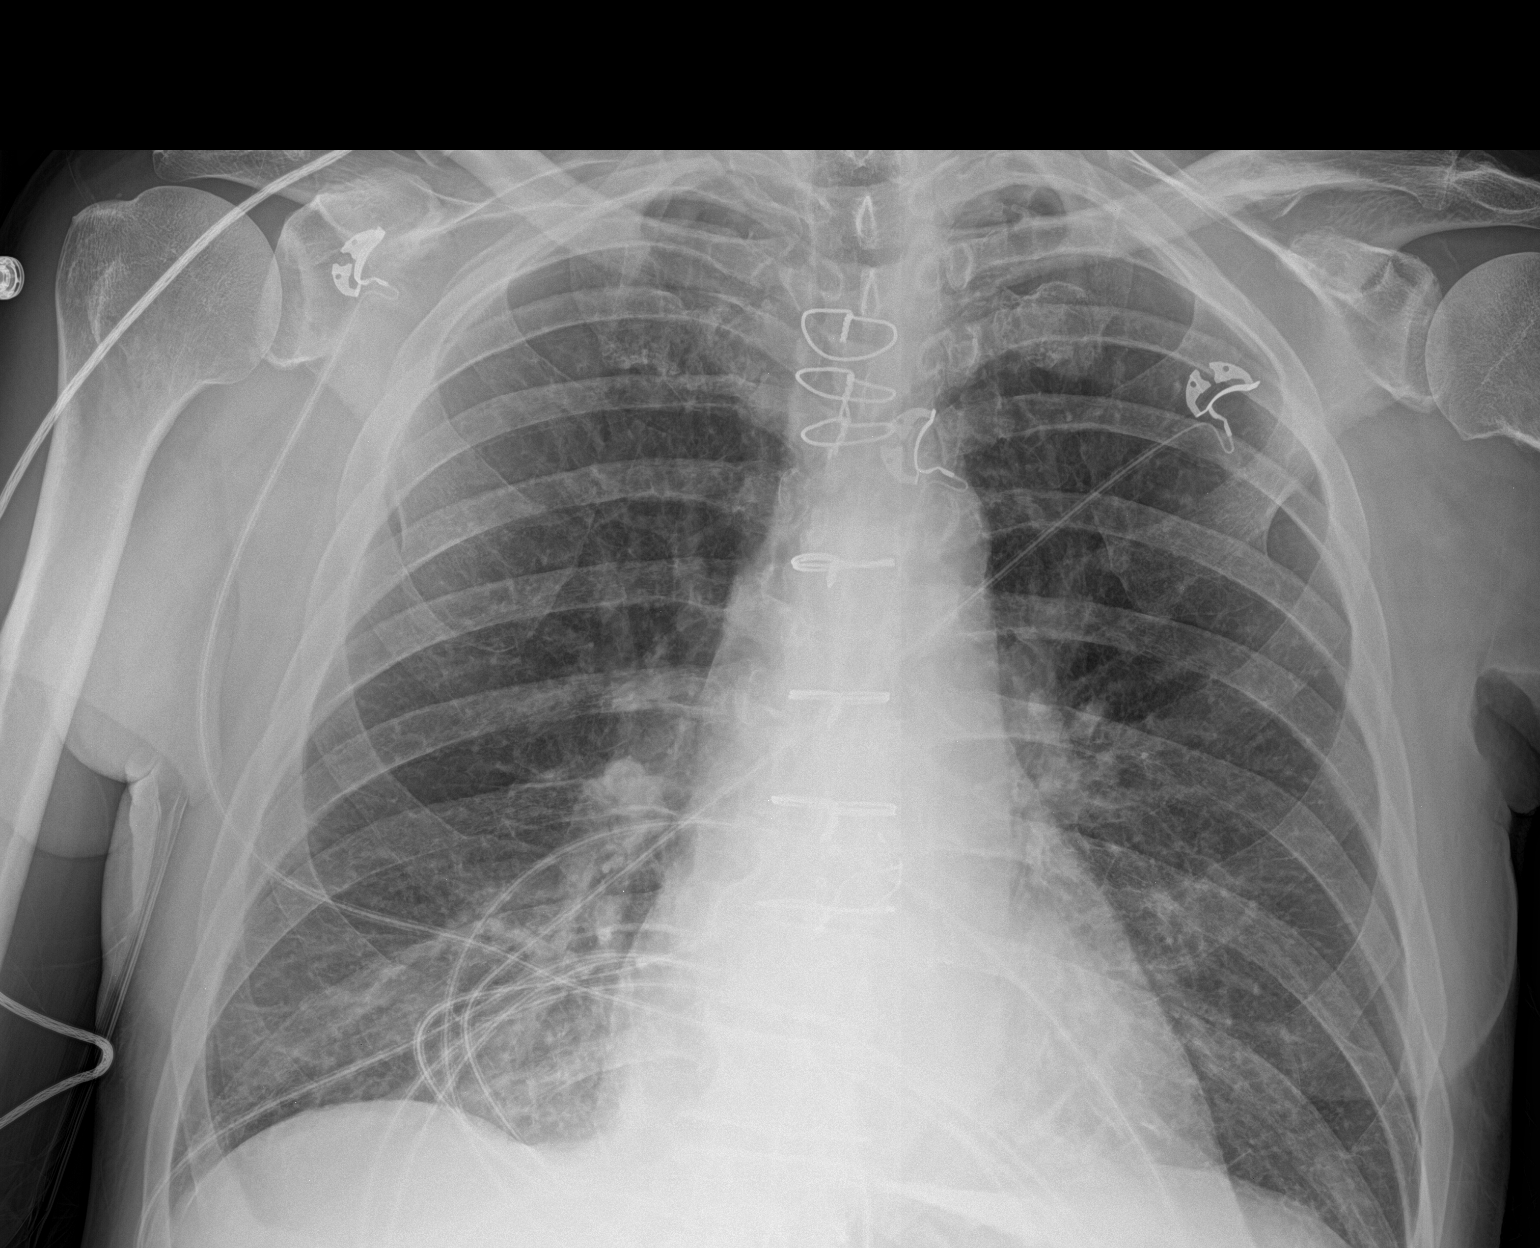
[im 2/2]
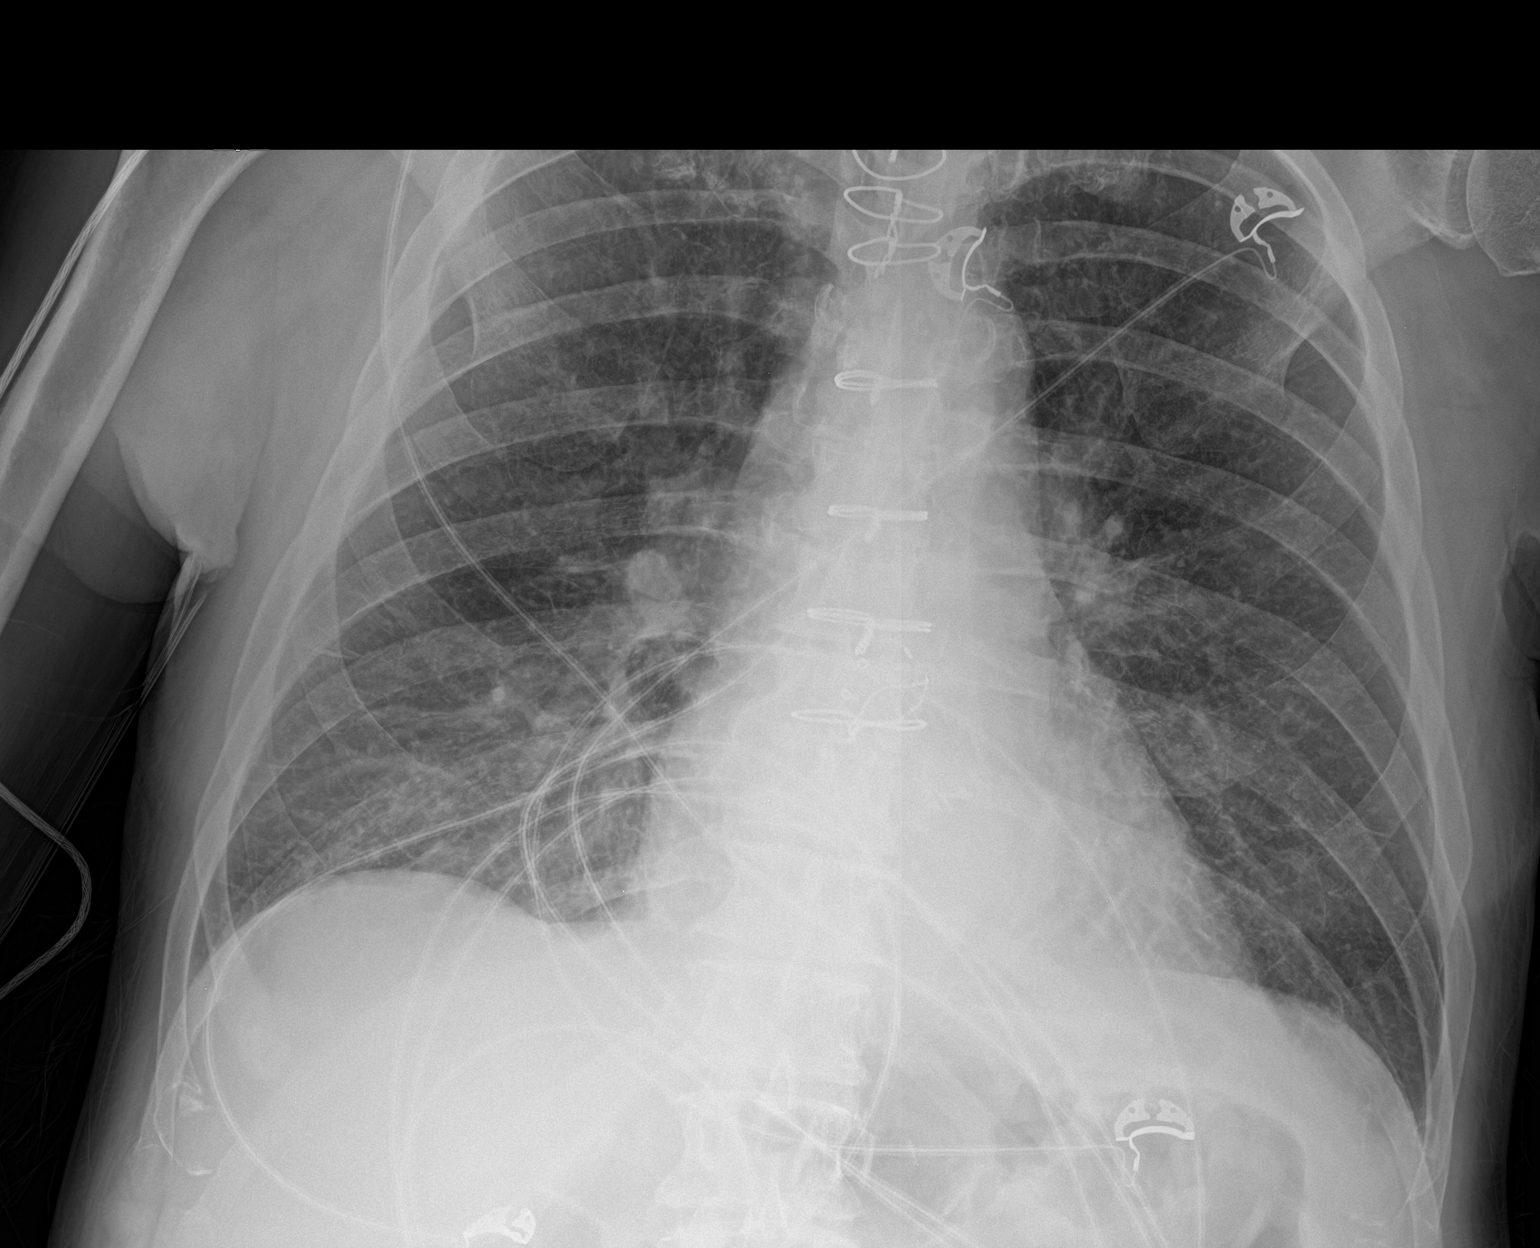

[2 of 2 positions shown; findings below may reference images not displayed]

FINDINGS: Prior median sternotomy and valve replacement. Heart is normal size.
Hyperinflation/COPD. Bibasilar opacities. No effusions. No acute
bony abnormality.
IMPRESSION: COPD.

Bibasilar atelectasis or infiltrates, new since prior study.

## 2021-01-05 SURGERY — ESOPHAGOGASTRODUODENOSCOPY (EGD) WITH PROPOFOL
Anesthesia: Monitor Anesthesia Care

## 2021-01-05 MED ORDER — PHENYLEPHRINE HCL (PRESSORS) 10 MG/ML IV SOLN
INTRAVENOUS | Status: DC | PRN
  Administered 2021-01-05 (×2): 120 ug via INTRAVENOUS

## 2021-01-05 MED ORDER — PROPOFOL 500 MG/50ML IV EMUL
INTRAVENOUS | Status: DC | PRN
  Administered 2021-01-05: 75 ug/kg/min via INTRAVENOUS

## 2021-01-05 MED ORDER — SODIUM CHLORIDE 0.9% IV SOLUTION: Freq: Once | INTRAVENOUS | Status: DC

## 2021-01-05 MED ORDER — METOPROLOL TARTRATE 25 MG PO TABS
25.0000 mg | ORAL_TABLET | Freq: Two times a day (BID) | ORAL | Status: DC
  Administered 2021-01-05 – 2021-01-10 (×12): 25 mg via ORAL
  Filled 2021-01-05 (×12): qty 1

## 2021-01-05 MED ORDER — SODIUM CHLORIDE 0.9 % IV SOLN: INTRAVENOUS | Status: DC | PRN

## 2021-01-05 MED ORDER — SODIUM CHLORIDE 0.9 % IV BOLUS
500.0000 mL | Freq: Once | INTRAVENOUS | Status: AC
  Administered 2021-01-05: 500 mL via INTRAVENOUS

## 2021-01-05 MED ORDER — MUSCLE RUB 10-15 % EX CREA
TOPICAL_CREAM | CUTANEOUS | Status: DC | PRN
  Administered 2021-01-05 – 2021-01-14 (×2): 1 via TOPICAL
  Filled 2021-01-05: qty 85

## 2021-01-05 MED ORDER — SODIUM CHLORIDE 0.9 % IV SOLN
2.0000 g | INTRAVENOUS | Status: DC
  Administered 2021-01-05 – 2021-01-06 (×2): 2 g via INTRAVENOUS
  Filled 2021-01-05: qty 2
  Filled 2021-01-05: qty 20

## 2021-01-05 SURGICAL SUPPLY — 15 items

## 2021-01-05 NOTE — Progress Notes (Signed)
PT Cancellation Note  Patient Details Name: Donald Harrington MRN: [id number] DOB: 1956-09-10   Cancelled Treatment:    Reason Eval/Treat Not Completed: Patient at procedure or test/unavailable. Pt off floor getting colonoscopy upon attempt at PT eval. Will follow-up later as time allows.   Moishe Spice, PT, DPT Acute Rehabilitation Services  Pager: [phone] Office: [place] 01/05/2021, 9:14 AM

## 2021-01-05 NOTE — Progress Notes (Addendum)
ADDENDUM:    Lab called and said they now have strep in 3 out of 3 bottles, MD aware   Thank you for allowing [place] to participate in this patients care.  Jens Som, PharmD 01/05/2021 12:50 PM  Please check [email or web address] for unit-specific pharmacy phone numbers.    PHARMACY - PHYSICIAN COMMUNICATION CRITICAL VALUE ALERT - BLOOD CULTURE IDENTIFICATION (BCID)  Microbiology Results  Procedure Component Value Units Date/Time  Blood Culture ID Panel (Reflexed) [[id number]] (Abnormal) Collected: 01/04/21 1504  Order Status: Completed Updated: 01/05/21 0853   Enterococcus faecalis NOT DETECTED   Enterococcus Faecium NOT DETECTED   Listeria monocytogenes NOT DETECTED   Staphylococcus species NOT DETECTED   Staphylococcus aureus (BCID) NOT DETECTED   Staphylococcus epidermidis NOT DETECTED   Staphylococcus lugdunensis NOT DETECTED   Streptococcus species DETECTEDAbnormal   Comment: Not Enterococcus species, Streptococcus agalactiae, Streptococcus pyogenes, or Streptococcus pneumoniae.  CRITICAL RESULT CALLED TO, READ BACK BY AND VERIFIED WITH:  J,MILLEN PHARMD @0851  01/05/21 EB      Streptococcus agalactiae NOT DETECTED   Streptococcus pneumoniae NOT DETECTED   Streptococcus pyogenes NOT DETECTED   A.calcoaceticus-baumannii NOT DETECTED   Bacteroides fragilis NOT DETECTED   Enterobacterales NOT DETECTED   Enterobacter cloacae complex NOT DETECTED   Escherichia coli NOT DETECTED   Klebsiella aerogenes NOT DETECTED   Klebsiella oxytoca NOT DETECTED   Klebsiella pneumoniae NOT DETECTED   Proteus species NOT DETECTED   Salmonella species NOT DETECTED   Serratia marcescens NOT DETECTED   Haemophilus influenzae NOT DETECTED   Neisseria meningitidis NOT DETECTED   Pseudomonas aeruginosa NOT DETECTED   Stenotrophomonas maltophilia NOT DETECTED   Candida albicans NOT DETECTED   Candida auris NOT DETECTED   Candida glabrata NOT DETECTED   Candida krusei NOT DETECTED   Candida  parapsilosis NOT DETECTED   Candida tropicalis NOT DETECTED   Cryptococcus neoformans/gattii NOT DETECTED   Comment: Performed at [facility] [address], [place], [place] [address]     Strep species in 1 out of 3 bottles.  Name of physician (or Provider) ContactedRenne Crigler, [place]  Changes to prescribed antibiotics required: adding Rocephin per MD   Thank you for allowing [place] to participate in this patients care.  Jens Som, PharmD 01/05/2021 11:48 AM  Please check [email or web address] for unit-specific pharmacy phone numbers.

## 2021-01-05 NOTE — Interval H&P Note (Signed)
History and Physical Interval Note:  01/05/2021 9:20 AM  Donald Harrington  has presented today for surgery, with the diagnosis of Anemia.  FOBT positive stools and minor bleeding per rectum.  The various methods of treatment have been discussed with the patient and family. After consideration of risks, benefits and other options for treatment, the patient has consented to  Procedure(s): ESOPHAGOGASTRODUODENOSCOPY (EGD) WITH PROPOFOL (N/A) as a surgical intervention.  The patient's history has been reviewed, patient examined, no change in status, stable for surgery.  I have reviewed the patient's chart and labs.  Questions were answered to the patient's satisfaction.     Thornton Park

## 2021-01-05 NOTE — Transfer of Care (Signed)
Immediate Anesthesia Transfer of Care Note  Patient: Donald Harrington  Procedure(s) Performed: ESOPHAGOGASTRODUODENOSCOPY (EGD) WITH PROPOFOL (N/A ) BIOPSY  Patient Location: Endoscopy Unit  Anesthesia Type:MAC  Level of Consciousness: awake, alert , oriented and patient cooperative  Airway & Oxygen Therapy: Patient Spontanous Breathing and Patient connected to nasal cannula oxygen  Post-op Assessment: Report given to RN and Post -op Vital signs reviewed and stable  Post vital signs: Reviewed and stable  Last Vitals:  Vitals Value Taken Time  BP 102/56 01/05/21 1025  Temp    Pulse 106   Resp 20 01/05/21 1027  SpO2 97   Vitals shown include unvalidated device data.  Last Pain:  Vitals:   01/05/21 0953  TempSrc: Temporal  PainSc:       Patients Stated Pain Goal: 4 (91/47/82 9562)  Complications: No complications documented.

## 2021-01-05 NOTE — Progress Notes (Signed)
Echocardiogram could not be performed at present due to increased HR in 130's. Nurse informed.  Alvino Chapel, RCS

## 2021-01-05 NOTE — Progress Notes (Signed)
OT Cancellation Note  Patient Details Name: Donald Harrington MRN: [id number] DOB: 04/27/1956   Cancelled Treatment:    Reason Eval/Treat Not Completed: Patient at procedure or test/ unavailable.  Nilsa Nutting., OTR/L Acute Rehabilitation Services Pager [phone] Office [phone]   Lucille Passy M 01/05/2021, 9:25 AM

## 2021-01-05 NOTE — Op Note (Signed)
[facility] Patient Name: Donald Harrington Procedure Date : 01/05/2021 MRN: [id number] Attending MD: Thornton Park MD, MD Date of Birth: [birth year] CSN: [id number] Age: 65 Admit Type: Inpatient Procedure:                Upper GI endoscopy Indications:              Heme positive stool with unexplained anemia. Prior                            GI bleed attributed to PUD in 2015. May have had a                            distant colonoscopy. Recently using Goody powder                            and had a steroid taper for head and neck pain.                            Concurrently colonoscopy recommended but he refused                            the prep. No known family history of malignancy. Providers:                Thornton Park MD, MD, Elmer Ramp. Tilden Dome, RN, Elspeth Cho Tech., Technician, Gala Lewandowsky, CRNA Referring MD:              Medicines:                Monitored Anesthesia Care Complications:            No immediate complications. Estimated blood loss:                            Minimal. Estimated Blood Loss:     Estimated blood loss was minimal. Procedure:                Pre-Anesthesia Assessment:                           - Prior to the procedure, a History and Physical                            was performed, and patient medications and                            allergies were reviewed. The patient's tolerance of                            previous anesthesia was also reviewed. The risks                            and benefits of the procedure and the sedation  options and risks were discussed with the patient.                            All questions were answered, and informed consent                            was obtained. Prior Anticoagulants: The patient has                            taken Coumadin (warfarin), last dose was 4 days                            prior to procedure. ASA Grade  Assessment: III - A                            patient with severe systemic disease. After                            reviewing the risks and benefits, the patient was                            deemed in satisfactory condition to undergo the                            procedure.                           After obtaining informed consent, the endoscope was                            passed under direct vision. Throughout the                            procedure, the patient's blood pressure, pulse, and                            oxygen saturations were monitored continuously. The                            GIF-H190 ([id number]) Olympus gastroscope was                            introduced through the mouth, and advanced to the                            third part of duodenum. The upper GI endoscopy was                            accomplished without difficulty. The patient                            tolerated the procedure well. Scope In: Scope Out: Findings:      The examined esophagus was normal.      The Z-line was  regular and was found 39 cm from the incisors.      Multiple localized erosions with no bleeding and no stigmata of recent       bleeding were found on the anterior wall of the stomach. Biopsies were       taken from the antrum, body, and fundus with a cold forceps for       histology. Estimated blood loss was minimal.      One non-bleeding superficial duodenal ulcer with no stigmata of bleeding       was found in the duodenal bulb. No visible vessel or adherent clot. The       ulcer margins are smooth. The lesion was 2-3 mm in largest dimension.      The cardia and gastric fundus were normal on retroflexion.      The exam was otherwise without abnormality. Impression:               - Normal esophagus.                           - Z-line regular, 39 cm from the incisors.                           - Erosive gastropathy with no bleeding and no                             stigmata of recent bleeding. Biopsied.                           - Non-bleeding duodenal ulcer with no stigmata of                            bleeding. Likely due to NSAIDs and recent steroids.                           - The examination was otherwise normal. Recommendation:           - Return patient to hospital ward for ongoing care.                           - Resume regular diet.                           - Continue present medications.                           - Pantoprazole 40 mg BID x 12 weeks, then once                            daily while needing antiinflammatory medications                            and/or steroids.                           - No aspirin, ibuprofen, naproxen, or other  non-steroidal anti-inflammatory drugs.                           - From a GI perspective, it is okay to resume                            warfarin if clinically indicated.                           - Await pathology results looking for H pylori.                           - Colonoscopy, however, the patient has refused the                            prep and ultimately the procedure.                           The inpatient GI team will move to stand-by. Please                            call the on-call gastroenterologist with any                            additional questions or concerns. Procedure Code(s):        --- Professional ---                           [phone], Esophagogastroduodenoscopy, flexible,                            transoral; with biopsy, single or multiple Diagnosis Code(s):        --- Professional ---                           K31.89, Other diseases of stomach and duodenum                           K26.9, Duodenal ulcer, unspecified as acute or                            chronic, without hemorrhage or perforation                           R19.5, Other fecal abnormalities CPT copyright 2019 [employer]. All rights reserved. The  codes documented in this report are preliminary and upon coder review may  be revised to meet current compliance requirements. Thornton Park MD, MD 01/05/2021 10:35:31 AM This report has been signed electronically. Number of Addenda: 0

## 2021-01-05 NOTE — Evaluation (Addendum)
Physical Therapy Evaluation Patient Details Name: Donald Harrington MRN: [id number] DOB: [birth year] Today's Date: 01/05/2021   History of Present Illness  Pt is a 65 y.o. 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 Chest x-ray impression of COPD and bibasilar atelectasis or infiltrates, new since prior study. 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  Clinical Impression  Pt presents with condition above and deficits mentioned below, see PT Problem List. PTA, he was living alone in a ground-floor handicap accessible apartment and performing all ADLs and functional mobility with mod I using a SPC. Currently, pt is requiring minA for steadying with transfers and short bedroom gait distances with his SPC. He displays expressive deficits and generalized weakness and incoordination in his legs (L more so than R) and poor balance, placing him at risk for falls. Pt demonstrates impaired cognition in that he has STM deficits and poor awareness into his deficits and safety concerns. Pt wants to return home independently and is very prideful in not wanting anyone to assist him at home. Pt with no available assistance going home either. As pt's current level of function varies greatly from his PLOF and he is motivated to improve he could greatly benefit from intensive therapy in the CIR setting to maximize his safety and independence with all functional mobility. If that is not possible he may need to go to a SNF for ideally short-term rehab prior to return home. Will continue to follow acutely.     Follow Up Recommendations CIR;Supervision/Assistance - 24 hour (may change with progression;  if not CIR then SNF)    Equipment Recommendations  3in1 (PT) (may change with progression)    Recommendations for Other Services Rehab consult     Precautions / Restrictions Precautions Precautions: Fall Precaution Comments: HOH; HR < 130 Restrictions Weight Bearing Restrictions: No      Mobility  Bed Mobility Overal bed mobility: Modified Independent             General bed mobility comments: Pt able to come to sit with HOB elevated safely.    Transfers Overall transfer level: Needs assistance Equipment used: Straight cane Transfers: Sit to/from Stand Sit to Stand: Min assist         General transfer comment: MinA for steadying coming to stand from EOB with SPC.  Ambulation/Gait Ambulation/Gait assistance: Min assist Gait Distance (Feet): 12 Feet Assistive device: Straight cane Gait Pattern/deviations: Step-to pattern;Decreased step length - right;Decreased stance time - left;Decreased stride length;Shuffle;Staggering left;Staggering right Gait velocity: reduced Gait velocity interpretation: <1.8 ft/sec, indicate of risk for recurrent falls General Gait Details: Pt ambulates slowly with unsteadiness, staggering either direction on occasion, needing minA for safety and balance. Pt with noted decreased L stance and R step length.  Stairs            Wheelchair Mobility    Modified Rankin (Stroke Patients Only) Modified Rankin (Stroke Patients Only) Pre-Morbid Rankin Score: No symptoms Modified Rankin: Moderately severe disability     Balance Overall balance assessment: Needs assistance Sitting-balance support: No upper extremity supported;Feet supported Sitting balance-Leahy Scale: Good     Standing balance support: Single extremity supported Standing balance-Leahy Scale: Poor Standing balance comment: Reliant on 1  UE support, noted LOB needing physical assistance to recover.                             Pertinent Vitals/Pain Pain  Assessment: Faces Faces Pain Scale: Hurts little more Pain Location: neck Pain Descriptors / Indicators: Aching Pain Intervention(s): Limited activity within patient's tolerance;Monitored during session;Repositioned (RN applied a cream to his neck upon arrival)    Home Living Family/patient expects to be discharged to:: Private residence Living Arrangements: Alone Available Help at Discharge:  (no friends or family nearby) Type of Home: Apartment (ground floor) Home Access: Level entry     Home Layout: One level Home Equipment: Cane - single point;Walker - 2 wheels Additional Comments: Reports apartment is handicap accessible.    Prior Function Level of Independence: Independent with assistive device(s)         Comments: Pt uses SPC for mobility. Pt drives. Pt believes "to need someone to help you is shameful".     Hand Dominance   Dominant Hand: Right    Extremity/Trunk Assessment   Upper Extremity Assessment Upper Extremity Assessment: Defer to OT evaluation    Lower Extremity Assessment Lower Extremity Assessment: RLE deficits/detail;LLE deficits/detail RLE Deficits / Details: MMT scores of the following: hip flexion 4+, knee extension 5, knee flexion 4, ankle dorsiflexion 5 RLE Sensation:  (denies numbness/tingling) RLE Coordination: decreased gross motor LLE Deficits / Details: MMT scores of the following: hip flexion 4, knee extension 5, knee flexion 4-, ankle dorsiflexion 5 LLE Sensation:  (denies numbness/tingling) LLE Coordination: decreased gross motor (slower, more purposeful movements than R with foot tapping and heel-shin rub)    Cervical / Trunk Assessment Cervical / Trunk Assessment: Normal  Communication   Communication: Expressive difficulties  Cognition Arousal/Alertness: Awake/alert Behavior During Therapy: Flat affect Overall Cognitive Status: No family/caregiver present to determine baseline cognitive functioning                                  General Comments: Pt with difficulty finding words. STM deficits noted trying to remember home set-up and cues during session. Poor safety and deficits awareness as pt desires to go home alone. Pt prideful in not wanting to accept help at home.      General Comments General comments (skin integrity, edema, etc.): HR 100-110s at rest, up to low 120s with mobility; increased labored breathing with gait, unreliable SpO2 readings; monitor alarmed "missed beat" 2x    Exercises     Assessment/Plan    PT Assessment Patient needs continued PT services  PT Problem List Decreased strength;Decreased activity tolerance;Decreased balance;Decreased mobility;Decreased cognition;Decreased coordination;Decreased knowledge of use of DME;Decreased safety awareness;Decreased knowledge of precautions       PT Treatment Interventions DME instruction;Gait training;Functional mobility training;Therapeutic activities;Therapeutic exercise;Balance training;Neuromuscular re-education;Cognitive remediation;Patient/family education    PT Goals (Current goals can be found in the Care Plan section)  Acute Rehab PT Goals Patient Stated Goal: to go home independently PT Goal Formulation: With patient Time For Goal Achievement: 01/19/21 Potential to Achieve Goals: Good    Frequency Min 4X/week   Barriers to discharge Decreased caregiver support      Co-evaluation               AM-PAC PT "6 Clicks" Mobility  Outcome Measure Help needed turning from your back to your side while in a flat bed without using bedrails?: None Help  needed moving from lying on your back to sitting on the side of a flat bed without using bedrails?: None Help needed moving to and from a bed to a chair (including a wheelchair)?: A Little Help needed standing up from a chair using your arms (e.g., wheelchair or bedside chair)?: A Little Help needed to walk in hospital room?: A Little Help needed climbing 3-5 steps  with a railing? : A Lot 6 Click Score: 19    End of Session Equipment Utilized During Treatment: Gait belt Activity Tolerance: Patient tolerated treatment well Patient left: in chair;with call bell/phone within reach;with chair alarm set Nurse Communication: Mobility status;Other (comment) (vitals) PT Visit Diagnosis: Unsteadiness on feet (R26.81);Other abnormalities of gait and mobility (R26.89);Muscle weakness (generalized) (M62.81);Difficulty in walking, not elsewhere classified (R26.2);Other symptoms and signs involving the nervous system (R29.898)    Time: 1735-1810 PT Time Calculation (min) (ACUTE ONLY): 35 min   Charges:   PT Evaluation $PT Eval Moderate Complexity: 1 Mod PT Treatments $Therapeutic Activity: 8-22 mins        Moishe Spice, PT, DPT Acute Rehabilitation Services  Pager: [phone] Office: [phone]   Orvan Falconer 01/05/2021, 6:32 PM

## 2021-01-05 NOTE — Evaluation (Signed)
Speech Language Pathology Evaluation Patient Details Name: Donald Harrington MRN: [id number] DOB: 02-25-1956 Today's Date: 01/05/2021 Time: 4098-1191 SLP Time Calculation (min) (ACUTE ONLY): 37 min  Problem List:  Patient Active Problem List   Diagnosis Date Noted  . Bacteremia due to Streptococcus   . Duodenal ulcer   . Acute gastric erosion   . Acute embolic stroke ([place]) 47/82/9562  . GI bleed 01/03/2021  . Acute blood loss anemia 01/03/2021  . Cough 01/03/2021  . PAF (paroxysmal atrial fibrillation) ([place]) 01/03/2021  . History of aortic valve replacement 01/03/2021  . History of mitral valve replacement 01/03/2021   Past Medical History:  Past Medical History:  Diagnosis Date  . Alcohol abuse   . Bladder cancer ([place])    remission s/p TURBT  . CAD (coronary artery disease), native coronary artery    MI in setting of endocarditis  . 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 Endocarditis    Mitral and Aortic valve 2015  . HTN (hypertension)   . Hypothyroidism   . NICM (nonischemic cardiomyopathy) ([place])    Normal EF as of 2020 echo  . PAF (paroxysmal atrial fibrillation) ([facility])    coumadin anticoagulation due to h/o valve replacements with bioprosthetic valves  . PUD (peptic ulcer disease)    causing GIB in 2015   Past Surgical History:  Past Surgical History:  Procedure Laterality Date  . AORTIC VALVE REPLACEMENT  04/2014   bioprosthetic  . LAPAROSCOPIC APPENDECTOMY  2021  . 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 fib, history of peptic ulcer disease causing GI bleed in 2015 presented to ED at [facility]. MRI done as outpt due to c/o headache; MRI demonstrated acute/subacute bilateral cerebral and cerebellar ischemic embolic lacunar CVA likely embolic  in the setting of known atrial fibrillation. 3/20 endoscopy revealed erosive gastropathy with no bleeding, nonbleeding ulcer.   Assessment / Plan / Recommendation Clinical Impression  Pt was reluctant to participate in formal assessment of cognitive-communication.  He is quite proud of his independence and self-reliance and appeared defensive and irritable when asked too many direct questions. His speech was clear and fluent. He demonstrated understanding of questions about personal biography and detailed elements of his history relatively well. He talked about organizing lists of meds and upcoming appointments.  He demonstrated awareness of upcoming tests and recalled elements of the last few days. He was fully oriented x3. There were occasional delays during word-retrieval at conversational level.  When this occurred, pt was quick to ask that he wouldn't be "punished" for needing extra time to formulate thoughts.  He became calmer when afforded time to talk and when he was listened to.  No SLP f/u is recommended at this time given reluctance to fully participate and no obvious deficits identified.    SLP Assessment  SLP Recommendation/Assessment: Patient does not need any further Speech Lanaguage Pathology Services SLP Visit Diagnosis: Cognitive communication deficit (R41.841)    Follow Up Recommendations  None    Frequency and Duration   n/a        SLP Evaluation Cognition  Overall Cognitive Status: No family/caregiver present to determine baseline cognitive functioning Arousal/Alertness: Awake/alert Orientation Level: Oriented to person;Oriented to place;Oriented to time;Oriented to situation Attention: Sustained Sustained Attention: Appears intact  Memory:  (difficult to assess) Behaviors: Poor frustration tolerance Safety/Judgment: Appears intact       Comprehension  Auditory Comprehension Overall Auditory Comprehension: Appears within functional limits for tasks assessed Reading  Comprehension Reading Status: Not tested (pt declined)    Expression Expression Primary Mode of Expression: Verbal Verbal Expression Overall Verbal Expression: Appears within functional limits for tasks assessed Written Expression Dominant Hand: Right   Oral / Motor  Oral Motor/Sensory Function Overall Oral Motor/Sensory Function: Within functional limits Motor Speech Overall Motor Speech: Appears within functional limits for tasks assessed   GO                    Juan Quam Laurice 01/05/2021, 3:38 PM  Estill Bamberg L. Tivis Ringer, [place] Office number [phone] Pager [phone]

## 2021-01-05 NOTE — Anesthesia Preprocedure Evaluation (Addendum)
Anesthesia Evaluation  Patient identified by MRN, date of birth, ID band Patient awake    Reviewed: Allergy & Precautions, NPO status , Patient's Chart, lab work & pertinent test results  Airway Mallampati: II  TM Distance: >3 FB Neck ROM: Full    Dental  (+) Edentulous Upper, Partial Lower, Dental Advisory Given, Missing, Poor Dentition   Pulmonary neg pulmonary ROS, Current Smoker,    Pulmonary exam normal breath sounds clear to auscultation       Cardiovascular hypertension, + CAD and +CHF  + dysrhythmias Atrial Fibrillation + Valvular Problems/Murmurs  Rhythm:Regular Rate:Tachycardia + Systolic murmurs    Neuro/Psych PSYCHIATRIC DISORDERS CVA    GI/Hepatic Neg liver ROS, PUD,   Endo/Other  Hypothyroidism   Renal/GU negative Renal ROS     Musculoskeletal negative musculoskeletal ROS (+)   Abdominal   Peds  Hematology  (+) Blood dyscrasia, anemia ,   Anesthesia Other Findings   Reproductive/Obstetrics                            Anesthesia Physical Anesthesia Plan  ASA: IV  Anesthesia Plan: MAC   Post-op Pain Management:    Induction: Intravenous  PONV Risk Score and Plan: 0 and Propofol infusion and Treatment may vary due to age or medical condition  Airway Management Planned: Natural Airway  Additional Equipment: None  Intra-op Plan:   Post-operative Plan:   Informed Consent: I have reviewed the patients History and Physical, chart, labs and discussed the procedure including the risks, benefits and alternatives for the proposed anesthesia with the patient or authorized representative who has indicated his/her understanding and acceptance.     Dental advisory given  Plan Discussed with: CRNA  Anesthesia Plan Comments: (Tachy, but BP WNL. Plan to start unit of PRBCs prior to procedure.)       Anesthesia Quick Evaluation

## 2021-01-05 NOTE — Progress Notes (Addendum)
PROGRESS NOTE  Donald Harrington VHQ:[id number] DOB: 1956-01-18 DOA: 01/03/2021 PCP: No primary care provider on file.   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 fib on Coumadin, history of peptic ulcer disease causing GI bleed in 2015 comes into the hospital after an outpatient MRI showed multifocal acute embolic strokes in various parts of the brain.  He is quite tangential on my interview but tells me that he has been also taking Goody's powder for neck pain (cervical disc disease), also reported some dark stools recently.  No chest pain, no abdominal pain, no nausea or vomiting  Subjective / 24h Interval events: No complaints, awaiting endoscopy and colonoscopy.  Assessment & Plan: Principal Problem Acute blood loss anemia, concern for GI bleed-patient reports using Goody's powder for his neck pain, also dark stools.  Continue Protonix.  GI consulted, appreciate input.  Of note, he has a history of peptic ulcer disease -Hemoglobin was 7.0 on 3/19 status post 2 unit of packed red blood cells, improved and stable.  Transfuse for less than 7.  No obvious bleeding  Active Problems Acute multifocal embolic strokes-this is likely in the setting of subtherapeutic INR and history of A. fib.  He reports that he himself cut down his Coumadin dose and he was eating more broccoli, difficult to get a straight story but tells me he did this because of taking Goody's powder.  For now holding anticoagulation due to #1, appreciate neurology follow-up  Hypotension-on admission, possibly in the setting of GI bleed, stable today in the 120s this morning  Fever, probable Streptococcus bacteremia-patient has been having intermittent fevers, his Covid is negative, chest x-ray without significant infiltrates.  Has a history of endocarditis.  Blood  cultures have been obtained and growing Streptococcus in 2/4 bottles, and based on clinical suspicion this is probably true and not contaminant.  Start antibiotics today  Paroxysmal A. fib-currently in sinus tach.  Hold anticoagulation due to concern for GI bleed  Essential hypertension-hypotensive, hold medications  Cough-chest x-ray with some mild pulmonary venous congestion.  He is hypotensive and will not diurese.  Fortunately currently on room air.  2D echo still pending   Scheduled Meds: . sodium chloride   Intravenous Once  . [MAR Hold] bisacodyl  20 mg Oral Once  . [MAR Hold] pantoprazole  40 mg Intravenous Q12H   Continuous Infusions: . sodium chloride 50 mL/hr at 01/05/21 0625   PRN Meds:.[MAR Hold] acetaminophen **OR** [MAR Hold] acetaminophen, [MAR Hold] ondansetron **OR** [MAR Hold] ondansetron (ZOFRAN) IV, [MAR Hold] traMADol  Diet Orders (From admission, onward)    Start     Ordered   01/05/21 0500  Diet NPO time specified  Diet effective 0500 tomorrow        01/04/21 1142          DVT prophylaxis: SCDs Start: 01/03/21 2051     Code Status: Full Code  Family Communication: No family at bedside  Status is: Inpatient  Remains inpatient appropriate because:Hemodynamically unstable and Inpatient level of care appropriate due to severity of illness   Dispo: The patient is from: Home              Anticipated d/c is to: Home              Patient currently is not medically stable to d/c.   Difficult  to place patient No   Level of care: Progressive  Consultants:  Neurology Gastroenterology  Procedures:  2D echo: Pending  Microbiology  Blood cultures   Antimicrobials: none    Objective: Vitals:   01/04/21 2338 01/05/21 0309 01/05/21 0830 01/05/21 0912  BP: 123/83 95/74  128/71  Pulse: (!) 121 (!) 115  (!) 133  Resp: 20 15  15   Temp: (!) 100.8 F (38.2 C) 98.2 F (36.8 C) 98.8 F (37.1 C) 98.8 F (37.1 C)  TempSrc: Oral Oral Axillary  Temporal  SpO2: 92% 94%  93%  Weight:  78.9 kg  74.8 kg  Height:    5' 10.5" (1.791 m)    Intake/Output Summary (Last 24 hours) at 01/05/2021 0951 Last data filed at 01/05/2021 0800 Gross per 24 hour  Intake 1538.51 ml  Output 605 ml  Net 933.51 ml   Filed Weights   01/05/21 0309 01/05/21 0912  Weight: 78.9 kg 74.8 kg    Examination:  Constitutional: No distress Eyes: No icterus ENMT: mmm Neck: normal, supple Respiratory: Lungs are clear bilaterally, no wheezing, no crackles Cardiovascular: Regular rate and rhythm, no murmurs, no edema Abdomen: soft, NT, ND, bowel sounds positive Musculoskeletal: no clubbing / cyanosis.  Skin: No rashes seen Neurologic: No focal deficits   Data Reviewed: I have independently reviewed following labs and imaging studies   CBC: Recent Labs  Lab 01/04/21 0322 01/04/21 1511 01/05/21 0320  WBC 3.5*  --  4.1  HGB 7.0* 7.8* 7.6*  HCT 21.4* 24.0* 23.2*  MCV 105.9*  --  102.2*  PLT 125*  --  528*   Basic Metabolic Panel: Recent Labs  Lab 01/04/21 0322 01/05/21 0320  NA 137 137  K 4.5 3.6  CL 103 105  CO2 24 21*  GLUCOSE 97 90  BUN 15 15  CREATININE 1.29* 1.11  CALCIUM 8.1* 7.8*  MG  --  2.0  PHOS  --  3.0   Liver Function Tests: Recent Labs  Lab 01/04/21 0322 01/05/21 0320  AST 17 14*  ALT 17 14  ALKPHOS 42 38  BILITOT 1.2 0.9  PROT 5.2* 4.9*  ALBUMIN 2.4* 2.3*   Coagulation Profile: Recent Labs  Lab 01/05/21 0320  INR 1.5*   HbA1C: Recent Labs    01/04/21 0322  HGBA1C 4.1*   CBG: No results for input(s): GLUCAP in the last 168 hours.  Recent Results (from the past 240 hour(s))  Culture, blood (Routine X 2) w Reflex to ID Panel     Status: None (Preliminary result)   Collection Time: 01/04/21  3:04 PM   Specimen: BLOOD LEFT HAND  Result Value Ref Range Status   Specimen Description BLOOD LEFT HAND  Final   Special Requests   Final    BOTTLES DRAWN AEROBIC AND ANAEROBIC Blood Culture results may not  be optimal due to an inadequate volume of blood received in culture bottles   Culture  Setup Time   Final    GRAM POSITIVE COCCI IN CHAINS ANAEROBIC BOTTLE ONLY CRITICAL RESULT CALLED TO, READ BACK BY AND VERIFIED WITH: J,MILLEN PHARMD @0851  01/05/21 EB Performed at [facility], [place] [address], [place], [place] [address]    Culture [facility] POSITIVE COCCI  Final   Report Status PENDING  Incomplete  Blood Culture ID Panel (Reflexed)     Status: Abnormal   Collection Time: 01/04/21  3:04 PM  Result Value Ref Range Status   Enterococcus faecalis NOT DETECTED NOT DETECTED Final  Enterococcus Faecium NOT DETECTED NOT DETECTED Final   Listeria monocytogenes NOT DETECTED NOT DETECTED Final   Staphylococcus species NOT DETECTED NOT DETECTED Final   Staphylococcus aureus (BCID) NOT DETECTED NOT DETECTED Final   Staphylococcus epidermidis NOT DETECTED NOT DETECTED Final   Staphylococcus lugdunensis NOT DETECTED NOT DETECTED Final   Streptococcus species DETECTED (A) NOT DETECTED Final    Comment: Not Enterococcus species, Streptococcus agalactiae, Streptococcus pyogenes, or Streptococcus pneumoniae. CRITICAL RESULT CALLED TO, READ BACK BY AND VERIFIED WITH: J,MILLEN PHARMD @0851  01/05/21 EB    Streptococcus agalactiae NOT DETECTED NOT DETECTED Final   Streptococcus pneumoniae NOT DETECTED NOT DETECTED Final   Streptococcus pyogenes NOT DETECTED NOT DETECTED Final   A.calcoaceticus-baumannii NOT DETECTED NOT DETECTED Final   Bacteroides fragilis NOT DETECTED NOT DETECTED Final   Enterobacterales NOT DETECTED NOT DETECTED Final   Enterobacter cloacae complex NOT DETECTED NOT DETECTED Final   Escherichia coli NOT DETECTED NOT DETECTED Final   Klebsiella aerogenes NOT DETECTED NOT DETECTED Final   Klebsiella oxytoca NOT DETECTED NOT DETECTED Final   Klebsiella pneumoniae NOT DETECTED NOT DETECTED Final   Proteus species NOT DETECTED NOT DETECTED Final   Salmonella species NOT DETECTED NOT  DETECTED Final   Serratia marcescens NOT DETECTED NOT DETECTED Final   Haemophilus influenzae NOT DETECTED NOT DETECTED Final   Neisseria meningitidis NOT DETECTED NOT DETECTED Final   Pseudomonas aeruginosa NOT DETECTED NOT DETECTED Final   Stenotrophomonas maltophilia NOT DETECTED NOT DETECTED Final   Candida albicans NOT DETECTED NOT DETECTED Final   Candida auris NOT DETECTED NOT DETECTED Final   Candida glabrata NOT DETECTED NOT DETECTED Final   Candida krusei NOT DETECTED NOT DETECTED Final   Candida parapsilosis NOT DETECTED NOT DETECTED Final   Candida tropicalis NOT DETECTED NOT DETECTED Final   Cryptococcus neoformans/gattii NOT DETECTED NOT DETECTED Final    Comment: Performed at [facility], [address] [place], [place] [address]  Resp Panel by RT-PCR (Flu A&B, Covid) Nasopharyngeal Swab     Status: None   Collection Time: 01/05/21  6:31 AM   Specimen: Nasopharyngeal Swab; Nasopharyngeal(NP) swabs in vial transport medium  Result Value Ref Range Status   SARS Coronavirus 2 by RT PCR NEGATIVE NEGATIVE Final    Comment: (NOTE) SARS-CoV-2 target nucleic acids are NOT DETECTED.  The SARS-CoV-2 RNA is generally detectable in upper respiratory specimens during the acute phase of infection. The lowest concentration of SARS-CoV-2 viral copies this assay can detect is 138 copies/mL. A negative result does not preclude SARS-Cov-2 infection and should not be used as the sole basis for treatment or other patient management decisions. A negative result may occur with  improper specimen collection/handling, submission of specimen other than nasopharyngeal swab, presence of viral mutation(s) within the areas targeted by this assay, and inadequate number of viral copies(<138 copies/mL). A negative result must be combined with clinical observations, patient history, and epidemiological information. The expected result is Negative.  Fact Sheet for Patients:   EntrepreneurPulse.com.au  Fact Sheet for Healthcare Providers:  IncredibleEmployment.be  This test is no t yet approved or cleared by the [place] FDA and  has been authorized for detection and/or diagnosis of SARS-CoV-2 by FDA under an Emergency Use Authorization (EUA). This EUA will remain  in effect (meaning this test can be used) for the duration of the COVID-19 declaration under Section 564(b)(1) of the Act, 21 U.S.C.section 360bbb-3(b)(1), unless the authorization is terminated  or revoked sooner.  Influenza A by PCR NEGATIVE NEGATIVE Final   Influenza B by PCR NEGATIVE NEGATIVE Final    Comment: (NOTE) The Xpert Xpress SARS-CoV-2/FLU/RSV plus assay is intended as an aid in the diagnosis of influenza from Nasopharyngeal swab specimens and should not be used as a sole basis for treatment. Nasal washings and aspirates are unacceptable for Xpert Xpress SARS-CoV-2/FLU/RSV testing.  Fact Sheet for Patients: EntrepreneurPulse.com.au  Fact Sheet for Healthcare Providers: IncredibleEmployment.be  This test is not yet approved or cleared by the [place] FDA and has been authorized for detection and/or diagnosis of SARS-CoV-2 by FDA under an Emergency Use Authorization (EUA). This EUA will remain in effect (meaning this test can be used) for the duration of the COVID-19 declaration under Section 564(b)(1) of the Act, 21 U.S.C. section 360bbb-3(b)(1), unless the authorization is terminated or revoked.  Performed at [facility], [place] [address] [place], [place] [address]      Radiology Studies: MR ANGIO HEAD WO CONTRAST  Result Date: 01/04/2021 CLINICAL DATA:  Multiple embolic strokes. EXAM: MRA HEAD WITHOUT CONTRAST TECHNIQUE: Angiographic images of the [place] were obtained using MRA technique without intravenous contrast. COMPARISON:  MRI yesterday. FINDINGS: Both internal  carotid arteries are patent through the skull base and siphon regions. The right ICA supplies the right middle cerebral artery territory and both anterior cerebral artery territories. No evidence of large vessel occlusion. No correctable proximal stenosis. Left internal carotid artery supplies the left middle cerebral artery territory and the left PCA. No large vessel occlusion. Both vertebral arteries are patent to the basilar. No basilar stenosis. Posterior circulation branch vessels show flow. Left PCA takes fetal origin from the anterior circulation, as noted above. IMPRESSION: No large or medium vessel occlusion or correctable proximal stenosis. Electronically Signed   By: Nelson Chimes M.D.   On: 01/04/2021 16:47   VAS US CAROTID  Result Date: 01/04/2021 Carotid Arterial Duplex Study Indications:       CVA. Risk Factors:      Hypertension, coronary artery disease, prior CVA. 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                    MVR and AVR 2015. PAF, on Coumadin. Comparison Study:  No prior study Performing Technologist: Sharion Dove RVS  Examination Guidelines: A complete evaluation includes B-mode imaging, spectral Doppler, color Doppler, and power Doppler as needed of all accessible portions of each vessel. Bilateral testing is considered an integral part of a complete examination. Limited examinations for reoccurring indications may be performed as noted.  Right Carotid Findings: +----------+--------+--------+--------+------------------+------------------+           PSV cm/sEDV cm/sStenosisPlaque DescriptionComments           +----------+--------+--------+--------+------------------+------------------+ CCA Prox  79      32                                intimal thickening +----------+--------+--------+--------+------------------+------------------+ CCA Distal82      30                                intimal thickening  +----------+--------+--------+--------+------------------+------------------+ ICA Prox  128     49      40-59%  calcific          Shadowing          +----------+--------+--------+--------+------------------+------------------+  ICA Distal101     38                                                   +----------+--------+--------+--------+------------------+------------------+ ECA       134     20                                                   +----------+--------+--------+--------+------------------+------------------+ +----------+--------+-------+--------+-------------------+           PSV cm/sEDV cmsDescribeArm Pressure (mmHG) +----------+--------+-------+--------+-------------------+ Subclavian162                                        +----------+--------+-------+--------+-------------------+ +---------+--------+--+--------+--+ VertebralPSV cm/s69EDV cm/s21 +---------+--------+--+--------+--+  Left Carotid Findings: +----------+--------+--------+--------+------------------+---------+           PSV cm/sEDV cm/sStenosisPlaque DescriptionComments  +----------+--------+--------+--------+------------------+---------+ CCA Prox  140     48              heterogenous                +----------+--------+--------+--------+------------------+---------+ CCA Distal131     32              heterogenous                +----------+--------+--------+--------+------------------+---------+ ICA Prox  140     48      40-59%  calcific          Shadowing +----------+--------+--------+--------+------------------+---------+ ICA Mid   108     48                                          +----------+--------+--------+--------+------------------+---------+ ICA Distal101     38                                          +----------+--------+--------+--------+------------------+---------+ ECA       135     37                                           +----------+--------+--------+--------+------------------+---------+ +----------+--------+--------+--------+-------------------+           PSV cm/sEDV cm/sDescribeArm Pressure (mmHG) +----------+--------+--------+--------+-------------------+ [id number]                                          +----------+--------+--------+--------+-------------------+ +---------+--------+--+--------+--+ VertebralPSV cm/s58EDV cm/s22 +---------+--------+--+--------+--+   Summary: Right Carotid: Velocities in the right ICA are consistent with a 40-59%                stenosis. Left Carotid: Velocities in the left ICA are consistent with a 40-59% stenosis. Vertebrals:  Bilateral vertebral arteries demonstrate antegrade flow. Subclavians: Normal flow hemodynamics were seen in bilateral subclavian  arteries. *See table(s) above for measurements and observations.     Preliminary     Marzetta Board, MD, PhD [facility]  Between 7 am - 7 pm I am available, please contact me via Amion or Securechat  Between 7 pm - 7 am I am not available, please contact night coverage MD/APP via Amion

## 2021-01-05 NOTE — Progress Notes (Signed)
   01/05/21 1521  Assess: MEWS Score  Pulse Rate (!) 121  ECG Heart Rate (!) 121  Resp 18  Level of Consciousness Alert  SpO2 98 %  O2 Device Room Air  Patient Activity (if Appropriate) In bed  Assess: MEWS Score  MEWS Temp 0  MEWS Systolic 1  MEWS Pulse 2  MEWS RR 0  MEWS LOC 0  MEWS Score 3  MEWS Score Color Yellow  Assess: if the MEWS score is Yellow or Red  Were vital signs taken at a resting state? Yes  Focused Assessment No change from prior assessment  Early Detection of Sepsis Score *See Row Information* Low  MEWS guidelines implemented *See Row Information* Yes  Treat  MEWS Interventions Administered prn meds/treatments;Other (Comment) (IV bolus given as per MD)  Escalate  MEWS: Escalate Yellow: discuss with charge nurse/RN and consider discussing with provider and RRT  Notify: Charge Nurse/RN  Name of Charge Nurse/RN Notified Angela, RN  Date Charge Nurse/RN Notified 01/05/21  Time Charge Nurse/RN Notified 1530  Notify: Provider  Provider Name/Title Dr. Rickey Primus  Date Provider Notified 01/05/21  Time Provider Notified 1510  Notification Type Face-to-face  Notification Reason Other (Comment)  Provider response See new orders  Date of Provider Response 01/05/21  Time of Provider Response 1510  Notify: Rapid Response  Name of Rapid Response RN Notified Saralyn Pilar, RN  Date Rapid Response Notified 01/05/21  Time Rapid Response Notified 1500    Prior to going to endo, patient had had elevated pulse with declining blood pressure.  It was assessed in endo by gastric MD and he was given 1 unit RBCs.  Upon arrival back to the unit, patient continued to have a tachy-rhythm with rates of 130-140's. He appeared to be asymptomatic.  He stated that he had been on metoprolol and lasix at home and Dr. Cruzita Lederer elected to re-start his metoprolol after administering 500 ml of N/S to him.  Lungs appeared to have widespread rhonchi and expiratory wheezing.  Cough strong, but  non-productive.  Since blood pressure was declining, it was necessary to start the metoprolol after the bolus was complete.  This was done and HR slowly declined to 100-110.

## 2021-01-06 ENCOUNTER — Inpatient Hospital Stay (HOSPITAL_COMMUNITY): Payer: Medicare HMO

## 2021-01-06 DIAGNOSIS — B955 Unspecified streptococcus as the cause of diseases classified elsewhere: Secondary | ICD-10-CM | POA: Diagnosis not present

## 2021-01-06 DIAGNOSIS — D62 Acute posthemorrhagic anemia: Secondary | ICD-10-CM | POA: Diagnosis not present

## 2021-01-06 DIAGNOSIS — R7881 Bacteremia: Secondary | ICD-10-CM

## 2021-01-06 DIAGNOSIS — I059 Rheumatic mitral valve disease, unspecified: Secondary | ICD-10-CM

## 2021-01-06 DIAGNOSIS — I480 Paroxysmal atrial fibrillation: Secondary | ICD-10-CM | POA: Diagnosis not present

## 2021-01-06 DIAGNOSIS — K922 Gastrointestinal hemorrhage, unspecified: Secondary | ICD-10-CM | POA: Diagnosis not present

## 2021-01-06 DIAGNOSIS — I6389 Other cerebral infarction: Secondary | ICD-10-CM

## 2021-01-06 DIAGNOSIS — I639 Cerebral infarction, unspecified: Secondary | ICD-10-CM

## 2021-01-06 DIAGNOSIS — Z952 Presence of prosthetic heart valve: Secondary | ICD-10-CM

## 2021-01-06 DIAGNOSIS — R519 Headache, unspecified: Secondary | ICD-10-CM

## 2021-01-06 DIAGNOSIS — M542 Cervicalgia: Secondary | ICD-10-CM

## 2021-01-06 LAB — HEPARIN LEVEL (UNFRACTIONATED): Heparin Unfractionated: <0.1 IU/mL — ABNORMAL LOW (ref 0.30–0.70)

## 2021-01-06 LAB — BASIC METABOLIC PANEL
Anion gap: 7 (ref 5–15)
BUN: 15 mg/dL (ref 8–23)
CO2: 23 mmol/L (ref 22–32)
Calcium: 7.7 mg/dL — ABNORMAL LOW (ref 8.9–10.3)
Chloride: 105 mmol/L (ref 98–111)
Creatinine, Ser: 1.06 mg/dL (ref 0.61–1.24)
GFR, Estimated: >60 mL/min (ref >60)
Glucose, Bld: 96 mg/dL (ref 70–99)
Potassium: 3.6 mmol/L (ref 3.5–5.1)
Sodium: 135 mmol/L (ref 135–145)

## 2021-01-06 LAB — CBC
HCT: 25.7 % — ABNORMAL LOW (ref 39.0–52.0)
Hemoglobin: 8.2 g/dL — ABNORMAL LOW (ref 13.0–17.0)
MCH: 32.5 pg (ref 26.0–34.0)
MCHC: 31.9 g/dL (ref 30.0–36.0)
MCV: 102 fL — ABNORMAL HIGH (ref 80.0–100.0)
Platelets: 120 10*3/uL — ABNORMAL LOW (ref 150–400)
RBC: 2.52 MIL/uL — ABNORMAL LOW (ref 4.22–5.81)
RDW: 17.1 % — ABNORMAL HIGH (ref 11.5–15.5)
WBC: 3.3 10*3/uL — ABNORMAL LOW (ref 4.0–10.5)
nRBC: 0 % (ref 0.0–0.2)

## 2021-01-06 LAB — ECHOCARDIOGRAM COMPLETE
AR max vel: 0.67 cm2
AV Area VTI: 0.43 cm2
AV Area mean vel: 0.5 cm2
AV Mean grad: 30 mmHg
AV Peak grad: 50.7 mmHg
Ao pk vel: 3.56 m/s
Height: 70.5 in
MV VTI: 0.6 cm2
S' Lateral: 3.7 cm
Weight: 2640 oz

## 2021-01-06 MED ORDER — HEPARIN (PORCINE) 25000 UT/250ML-% IV SOLN
2300.0000 [IU]/h | INTRAVENOUS | Status: DC
  Administered 2021-01-06: 900 [IU]/h via INTRAVENOUS
  Administered 2021-01-07: 1300 [IU]/h via INTRAVENOUS
  Administered 2021-01-08: 1650 [IU]/h via INTRAVENOUS
  Administered 2021-01-09: 1750 [IU]/h via INTRAVENOUS
  Administered 2021-01-09: 2050 [IU]/h via INTRAVENOUS
  Filled 2021-01-06 (×7): qty 250

## 2021-01-06 MED ORDER — MELATONIN 5 MG PO TABS
5.0000 mg | ORAL_TABLET | Freq: Every day | ORAL | Status: DC
  Administered 2021-01-06 – 2021-01-20 (×16): 5 mg via ORAL
  Filled 2021-01-06 (×16): qty 1

## 2021-01-06 MED ORDER — GENTAMICIN IN SALINE 1.6-0.9 MG/ML-% IV SOLN
80.0000 mg | INTRAVENOUS | Status: DC
  Administered 2021-01-06: 80 mg via INTRAVENOUS
  Filled 2021-01-06 (×2): qty 50

## 2021-01-06 MED ORDER — SODIUM CHLORIDE 0.9 % IV SOLN
2.0000 g | Freq: Two times a day (BID) | INTRAVENOUS | Status: DC
  Administered 2021-01-06 – 2021-01-07 (×2): 2 g via INTRAVENOUS
  Filled 2021-01-06 (×3): qty 20

## 2021-01-06 NOTE — Progress Notes (Signed)
UNASSIGNED PATIENT Subjective: 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  Endoscopy revealed duodenal ulcer with erosive gastropathy and patient seems to be doing well on PPIs.  He refused a colonoscopy as the prep was an adequate and the procedure could not be done. Patient denies having abdominal pain melena or hematochezia at this time.  His heparin has been resumed today.  Objective: Vital signs in last 24 hours: Temp:  [97.8 F (36.6 C)-100.3 F (37.9 C)] 98.5 F (36.9 C) (03/21 1229) Pulse Rate:  [69-108] 91 (03/21 1229) Resp:  [17-19] 19 (03/21 1229) BP: (94-120)/(63-78) 120/78 (03/21 1229) SpO2:  [55 %-97 %] 97 % (03/21 1229) Last BM Date: 01/05/21  Intake/Output from previous day: 03/20 0701 - 03/21 0700 In: 1885.9 [P.O.:480; I.V.:611.9; Blood:194; IV Piggyback:600] Out: 3 [Urine:1; Stool:2] Intake/Output this shift: Total I/O In: 452 [I.V.:447.1; IV Piggyback:4.9] Out: 900 [Urine:900]  General appearance: cooperative, appears stated age, fatigued, no distress and pale Resp: clear to auscultation bilaterally Cardio: Irregularly irregular rate and rhythm, S1, S2 normal, no murmur, click, rub or gallop GI: soft, non-tender; bowel sounds normal; no masses,  no organomegaly Extremities: extremities normal, atraumatic, no cyanosis or edema  Lab Results: Recent Labs    01/04/21 0322 01/04/21 1511 01/05/21 0320 01/06/21 0410  WBC 3.5*  --  4.1 3.3*  HGB 7.0* 7.8* 7.6* 8.2*  HCT 21.4* 24.0* 23.2* 25.7*  PLT 125*  --  124* 120*   BMET Recent Labs    01/04/21 0322 01/05/21 0320 01/06/21 0410  NA 137 137 135  K 4.5 3.6 3.6  CL 103 105 105  CO2 24 21* 23  GLUCOSE 97  90 96  BUN 15 15 15   CREATININE 1.29* 1.11 1.06  CALCIUM 8.1* 7.8* 7.7*   LFT Recent Labs    01/05/21 0320  PROT 4.9*  ALBUMIN 2.3*  AST 14*  ALT 14  ALKPHOS 38  BILITOT 0.9   PT/INR Recent Labs    01/05/21 0320  LABPROT 17.3*  INR 1.5*   Studies/Results: DG CHEST PORT 1 VIEW  Result Date: 01/05/2021 CLINICAL DATA:  Shortness of breath EXAM: PORTABLE CHEST 1 VIEW COMPARISON:  01/03/2021 FINDINGS: Prior median sternotomy and valve replacement. Heart is normal size. Hyperinflation/COPD. Bibasilar opacities. No effusions. No acute bony abnormality. IMPRESSION: COPD. Bibasilar atelectasis or infiltrates, new since prior study. Electronically Signed   By: Rolm Baptise M.D.   On: 01/05/2021 15:35   ECHOCARDIOGRAM COMPLETE  Result Date: 01/06/2021    ECHOCARDIOGRAM REPORT   Patient Name:   Donald Harrington Date of Exam: 01/05/2021 Medical Rec #:  [id number]    Height:       70.5 in Accession #:    [id number]   Weight:       165.0 lb Date of Birth:  [birth year]    BSA:          1.934 m Patient Age:    29 years     BP:           109/68 mmHg Patient Gender: M            HR:           110 bpm. Exam Location:  Inpatient  Procedure: 2D Echo Indications:    Stroke  History:        Patient has no prior history of Echocardiogram examinations.                 Aortic and mitral replacement in 2015 at [facility] due                 to endocarditis; Aortic Valve Disease, Mitral Valve Disease and                 Endocarditis.  Sonographer:    Merrie Roof RDCS Referring Phys: [place]  1. Poor acoustic windows. Cannot exclude vegetations. Would recomm TEE to further evaluate if clinically indicated.  2. Left ventricular ejection fraction, by estimation, is 55 to 60%. The left ventricle has normal function. The left ventricle has no regional wall motion abnormalities. Indeterminate diastolic filling due to E-A fusion.  3. Right ventricular systolic function is normal. The right  ventricular size is normal.  4. Left atrial size was mild to moderately dilated.  5. MV prosthesis (Epic bioprosthesis, 05/01/14) Not well visualized. Peak and mean gradients through the valveare 19 and 9 mm Hx respectively. Compared to echo report from 06/05/19 [facility]) mild increase in mean gradient (6 to 9 mm). The mitral valve has been repaired/replaced. Trivial mitral valve regurgitation.  6. AV prosthesis (23 mm Hancock bioprosthesis, 05/01/14) present Not well visualized. Peak and mean gradients through the valve are 51 and 30 mm Hg respectively Comparee to reprot for 8/117/20 [facility]), mean graidnet is increased. . The aortic valve has been repaired/replaced. Aortic valve regurgitation is trivial.  7. The inferior vena cava is normal in size with greater than 50% respiratory variability, suggesting right atrial pressure of 3 mmHg. FINDINGS  Left Ventricle: Left ventricular ejection fraction, by estimation, is 55 to 60%. The left ventricle has normal function. The left ventricle has no regional wall motion abnormalities. The left ventricular internal cavity size was normal in size. There is  no left ventricular hypertrophy. Indeterminate diastolic filling due to E-A fusion. Right Ventricle: The right ventricular size is normal. Right vetricular wall thickness was not assessed. Right ventricular systolic function is normal. Left Atrium: Left atrial size was mild to moderately dilated. Right Atrium: Right atrial size was normal in size. Pericardium: There is no evidence of pericardial effusion. Mitral Valve: MV prosthesis (Epic bioprosthesis, 05/01/14) Not well visualized peak and mean gradients through the valveare 19 and 9 mm Hx respectively. Compared to echo reprot for 06/05/19 [facility]) mild increase in mean gradient (6 to 9 mm). The mitral valve has been repaired/replaced. Trivial mitral valve regurgitation. MV peak gradient, 18.5 mmHg. The mean mitral valve gradient is 9.0 mmHg. Tricuspid Valve: The tricuspid valve  is grossly normal. Tricuspid valve regurgitation is mild. Aortic Valve: AV prosthesis (23 mm Hancock bioprosthesis, 05/01/14) present Not well visualized. Peak and mean gradients through the valve are 51 and 30 mm Hg respectively Comparee to reprot for 8/117/20 [facility]), mean graidnet is increased. The aortic valve has been repaired/replaced. Aortic valve regurgitation is trivial. Aortic valve mean gradient measures 30.0 mmHg. Aortic valve peak gradient measures 50.7 mmHg. Aortic valve area, by VTI measures 0.43 cm. Pulmonic Valve: The pulmonic valve was not well visualized. Pulmonic valve regurgitation is not visualized. Aorta: The aortic root is normal in size and structure. Venous: The inferior vena cava is normal in size with greater than 50% respiratory variability, suggesting right atrial pressure of 3 mmHg.  IAS/Shunts: The interatrial septum was not assessed.  LEFT VENTRICLE PLAX 2D LVIDd:         5.10 cm  Diastology LVIDs:         3.70 cm  LV e' lateral: 10.30 cm/s LV PW:         0.80 cm LV IVS:        1.00 cm LVOT diam:     2.00 cm LV SV:         33 LV SV Index:   17 LVOT Area:     3.14 cm  RIGHT VENTRICLE          IVC RV Basal diam:  3.80 cm  IVC diam: 1.90 cm LEFT ATRIUM             Index       RIGHT ATRIUM           Index LA diam:        5.20 cm 2.69 cm/m  RA Area:     17.40 cm LA Vol (A2C):   64.0 ml 33.10 ml/m RA Volume:   45.50 ml  23.53 ml/m LA Vol (A4C):   95.3 ml 49.29 ml/m LA Biplane Vol: 82.6 ml 42.72 ml/m  AORTIC VALVE AV Area (Vmax):    0.67 cm AV Area (Vmean):   0.50 cm AV Area (VTI):     0.43 cm AV Vmax:           356.00 cm/s AV Vmean:          262.000 cm/s AV VTI:            0.752 m AV Peak Grad:      50.7 mmHg AV Mean Grad:      30.0 mmHg LVOT Vmax:         75.50 cm/s LVOT Vmean:        42.000 cm/s LVOT VTI:          0.104 m LVOT/AV VTI ratio: 0.14  AORTA Ao Root diam: 3.10 cm MITRAL VALVE             TRICUSPID VALVE MV Area VTI:  0.60 cm   TR Peak grad:   30.9 mmHg MV Peak grad:  18.5 mmHg  TR Vmax:        278.00 cm/s MV Mean grad: 9.0 mmHg MV Vmax:      2.15 m/s   SHUNTS MV Vmean:     139.0 cm/s Systemic VTI:  0.10 m                          Systemic Diam: 2.00 cm Dorris Carnes MD Electronically signed by Dorris Carnes MD Signature Date/Time: 01/06/2021/4:04:54 PM    Final    Medications: I have reviewed the patient's current medications.  Assessment/Plan: 1) Duodenal ulcer with erosive gastropathy causing posthemorrhagic anemia in the setting of atrial fibrillation-Heparin has been resumed. Monitor CBCs closely.  Will sign off now please call as needed if help is needed from a GI standpoint. 2) Acute multifocal embolic strokes. 3) Sepsis due to streptococcal bacteremia. 4) HTN.  LOS: 3 days   Juanita Craver 01/06/2021, 4:50 PM

## 2021-01-06 NOTE — Progress Notes (Signed)
Inpatient Rehab Admissions Coordinator Note:   Per PT/OT recommendations, pt was screened for CIR candidacy by Gayland Curry, MS, CCC-SLP.  At this time we are recommending an inpatient rehab consult.  AC will place consult order.  Please contact me with questions.    Gayland Curry, [place], [place] Admissions Coordinator [phone] 01/06/21 4:27 PM

## 2021-01-06 NOTE — Progress Notes (Signed)
Physical Therapy Treatment Patient Details Name: Donald Harrington MRN: [id number] DOB: 1956-06-25 Today's Date: 01/06/2021    History of Present Illness Pt is a 65 y.o. 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 Chest x-ray impression of COPD and bibasilar atelectasis or infiltrates, new since prior study. 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    PT Comments    Pt with much improved mental status this date as he is A&Ox3. However, he continues to be disoriented to situation and his current condition, expressing very morbid ideas in that he believes he is going to "die today in this bed", reporting desire to "call my friends to [place] goodbye before I die". RN has been made aware. Pt progressing with mobility safety and independence this date, ambulating with a RW up to ~105 ft with min guard-A. His stability and gait symmetry was much improved with the RW compared to with his [facility] during his previous session. However, he needs cues to manage the RW safely, placing him at risk for falls. Depending on how pt's mental and physical status progress may consider changing d/c recs to [facility] PT as he is very motivated to return home independently. Will continue to follow acutely. At this time, pt would benefit from skilled PT in the CIR setting (if not appropriate then SNF) prior to return home to decrease risk for falls and injuries.      Follow Up Recommendations  CIR;Supervision/Assistance - 24 hour (vs HH PT based on progression)     Equipment Recommendations  None recommended by PT    Recommendations for Other Services Rehab consult     Precautions / Restrictions  Precautions Precautions: Fall Precaution Comments: HOH; HR < 130 (return to bed if >130) Restrictions Weight Bearing Restrictions: No    Mobility  Bed Mobility Overal bed mobility: Modified Independent             General bed mobility comments: Pt able to come to sit and supine with HOB elevated and use of rails safely.    Transfers Overall transfer level: Needs assistance Equipment used: Rolling walker (2 wheeled) Transfers: Sit to/from Stand Sit to Stand: Min guard         General transfer comment: Min guard for safety coming to stand from EOB with RW. Instability noted upon coming to stand. Pt tends to pull up on RW.  Ambulation/Gait Ambulation/Gait assistance: Min guard;Min assist Gait Distance (Feet): 105 Feet Assistive device: Rolling walker (2 wheeled) Gait Pattern/deviations: Decreased step length - right;Decreased stride length;Shuffle;Step-through pattern;Decreased step length - left Gait velocity: reduced Gait velocity interpretation: <1.8 ft/sec, indicate of risk for recurrent falls General Gait Details: Pt ambulates slowly with mild instability with RW (improved from using SPC). Pt continues to demonstrate poor bil step length, shuffling at times, and poor management of RW, needing cues to stay proximal to it, especially during turns. Min guard-A for steadying and safety.   Stairs             Wheelchair Mobility    Modified Rankin (Stroke Patients Only) Modified Rankin (Stroke Patients Only) Pre-Morbid Rankin Score: No symptoms Modified Rankin: Moderately severe disability     Balance Overall balance assessment: Needs assistance  Sitting-balance support: No upper extremity supported;Feet supported Sitting balance-Leahy Scale: Good     Standing balance support: Bilateral upper extremity supported Standing balance-Leahy Scale: Poor Standing balance comment: Reliant on UE support and close min guard-A for stability/safety.                             Cognition Arousal/Alertness: Awake/alert Behavior During Therapy: Agitated Overall Cognitive Status: No family/caregiver present to determine baseline cognitive functioning                                 General Comments: Pt with improved mental status, finding words more eaisly this date with less perseveration. A&Ox3, not oriented to current situation. Pt with poor awareness into his deficits and condition believing he is going to die here. Pt with very morbid outlook currently despite encouragement to take it one day at a time. Pt agitated in regards to not having pants, obtained paper scub pants for pt with him being very appreciative.      Exercises      General Comments General comments (skin integrity, edema, etc.): HR up to 123 bpm during gait, in 100-110s at rest      Pertinent Vitals/Pain Pain Assessment: Faces Faces Pain Scale: Hurts even more Pain Location: neck Pain Descriptors / Indicators: Aching;Discomfort;Grimacing;Guarding Pain Intervention(s): Limited activity within patient's tolerance;Monitored during session;Repositioned;Patient requesting pain meds-RN notified    Home Living                      Prior Function            PT Goals (current goals can now be found in the care plan section) Acute Rehab PT Goals Patient Stated Goal: to call his friends to [place] goodbye PT Goal Formulation: With patient Time For Goal Achievement: 01/19/21 Potential to Achieve Goals: Good Progress towards PT goals: Progressing toward goals    Frequency    Min 4X/week      PT Plan Frequency needs to be updated;Discharge plan needs to be updated    Co-evaluation              AM-PAC PT "6 Clicks" Mobility   Outcome Measure  Help needed turning from your back to your side while in a flat bed without using bedrails?: None Help needed moving from lying on your back to sitting on the side of a flat bed without using  bedrails?: None Help needed moving to and from a bed to a chair (including a wheelchair)?: A Little Help needed standing up from a chair using your arms (e.g., wheelchair or bedside chair)?: A Little Help needed to walk in hospital room?: A Little Help needed climbing 3-5 steps with a railing? : A Lot 6 Click Score: 19    End of Session Equipment Utilized During Treatment: Gait belt Activity Tolerance: Patient tolerated treatment well Patient left: with call bell/phone within reach;in bed;with bed alarm set Nurse Communication: Mobility status;Other (comment);Patient requests pain meds (vitals; morbid comments; desire for phone charger) PT Visit Diagnosis: Unsteadiness on feet (R26.81);Other abnormalities of gait and mobility (R26.89);Muscle weakness (generalized) (M62.81);Difficulty in walking, not elsewhere classified (R26.2);Other symptoms and signs involving the nervous system (L24.401)     Time: 0272-5366 PT Time Calculation (min) (ACUTE ONLY): 30 min  Charges:  $Gait Training: 8-22 mins $Therapeutic Activity: 8-22 mins  Moishe Spice, PT, DPT Acute Rehabilitation Services  Pager: [phone] Office: [place] 01/06/2021, 9:39 AM

## 2021-01-06 NOTE — Progress Notes (Signed)
PROGRESS NOTE  Donald Harrington HBZ:[id number] DOB: August 29, 1956 DOA: 01/03/2021 PCP: No primary care provider on file.   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 fib on Coumadin, history of peptic ulcer disease causing GI bleed in 2015 comes into the hospital after an outpatient MRI showed multifocal acute embolic strokes in various parts of the brain.  He is quite tangential on my interview but tells me that he has been also taking Goody's powder for neck pain (cervical disc disease), also reported some dark stools recently.  No chest pain, no abdominal pain, no nausea or vomiting  Subjective / 24h Interval events: Complains of a headache and neck pain this morning.  These have been chronic  Assessment & Plan: Principal Problem Acute blood loss anemia, upper GI bleed, PUD-patient reports using Goody's powder for his neck pain, also dark stools.  Continue Protonix.  GI consulted and patient underwent an EGD on 3/20 which showed erosive gastropathy, duodenal ulcer but no evidence of bleeding.  Has been placed on PPI.  Received a total of 2 units of packed red blood cells, hemoglobin has improved.  No further bleeding clinically.  Okay to resume anticoagulation per GI, start heparin today without bolus and closely monitor  Active Problems Acute multifocal embolic strokes-this is likely in the setting of subtherapeutic INR and history of A. fib versus septic emboli/endocarditis given bacteremia.  He reports that he himself cut down his Coumadin dose and he was eating more broccoli, difficult to get a straight story but tells me he did this because of taking Goody's powder.  Start heparin today for anticoagulation after discussing with neuro.  Would probably benefit from Eliquis rather than Coumadin  Hypotension-on 3/20, in the setting of  GI bleed, sepsis, stabilized today  Sepsis due to Streptococcus bacteremia-patient with sepsis physiology 3/20, hypotension, fever, sinus tachycardia.  He has a history of endocarditis with Enterococcus in 2015, had CVA at that time likely due to that.  Streptococcus is not as likely to cause endocarditis as other bugs but will need to rule out.  I have consulted ID today.  Continue ceftriaxone  Paroxysmal A. fib-currently in sinus rhythm.  Heparin as above  Essential hypertension-hypotensive, hold medications  Cough-chest x-ray with some mild pulmonary venous congestion.  He is hypotensive and will not diurese.  Fortunately currently on room air.  2D echo still pending   Scheduled Meds: . sodium chloride   Intravenous Once  . bisacodyl  20 mg Oral Once  . melatonin  5 mg Oral QHS  . metoprolol tartrate  25 mg Oral BID  . pantoprazole  40 mg Intravenous Q12H   Continuous Infusions: . sodium chloride 50 mL/hr at 01/06/21 0500  . cefTRIAXone (ROCEPHIN)  IV Stopped (01/05/21 1619)   PRN Meds:.acetaminophen **OR** acetaminophen, Muscle Rub, ondansetron **OR** ondansetron (ZOFRAN) IV, traMADol  Diet Orders (From admission, onward)    Start     Ordered   01/05/21 1146  Diet regular Room service appropriate? Yes with Assist; Fluid consistency: Thin  Diet effective now       Question Answer Comment  Room service appropriate? Yes with Assist   Fluid consistency: Thin      01/05/21 1146          DVT prophylaxis: SCDs Start: 01/03/21 2051     Code Status: Full Code  Family Communication: No family at bedside  Status is: Inpatient  Remains inpatient appropriate because:Hemodynamically unstable and Inpatient level of care appropriate due to severity of illness   Dispo: The patient is from: Home              Anticipated d/c is to: Home              Patient currently is not medically stable to d/c.   Difficult to place patient No   Level of care: Progressive  Consultants:   Neurology Gastroenterology Infectious disease  Procedures:  2D echo: Pending  Microbiology  Blood cultures   Antimicrobials: none    Objective: Vitals:   01/05/21 2153 01/06/21 0013 01/06/21 0350 01/06/21 0720  BP: 102/64 94/64 111/71 109/68  Pulse: (!) 108 (!) 102 69 78  Resp: 17 18 17 18   Temp: 100.3 F (37.9 C) 99.2 F (37.3 C) 97.8 F (36.6 C) 98.4 F (36.9 C)  TempSrc: Oral Oral Oral Oral  SpO2: 96% 95% 91% 96%  Weight:      Height:        Intake/Output Summary (Last 24 hours) at 01/06/2021 0941 Last data filed at 01/06/2021 0500 Gross per 24 hour  Intake 1885.87 ml  Output 2 ml  Net 1883.87 ml   Filed Weights   01/05/21 0309 01/05/21 0912  Weight: 78.9 kg 74.8 kg    Examination:  Constitutional: No distress, in bed Eyes: No scleral icterus ENMT: Moist mucous membranes Neck: normal, supple Respiratory: Lungs are clear bilaterally, no wheezing, no crackles Cardiovascular: Regular rate and rhythm, no murmurs, no peripheral edema Abdomen: Soft, nontender, nondistended, positive bowel sounds Musculoskeletal: no clubbing / cyanosis.  Skin: No rashes seen Neurologic: Nonfocal, equal strength   Data Reviewed: I have independently reviewed following labs and imaging studies   CBC: Recent Labs  Lab 01/04/21 0322 01/04/21 1511 01/05/21 0320 01/06/21 0410  WBC 3.5*  --  4.1 3.3*  HGB 7.0* 7.8* 7.6* 8.2*  HCT 21.4* 24.0* 23.2* 25.7*  MCV 105.9*  --  102.2* 102.0*  PLT 125*  --  124* 174*   Basic Metabolic Panel: Recent Labs  Lab 01/04/21 0322 01/05/21 0320 01/06/21 0410  NA 137 137 135  K 4.5 3.6 3.6  CL 103 105 105  CO2 24 21* 23  GLUCOSE 97 90 96  BUN 15 15 15   CREATININE 1.29* 1.11 1.06  CALCIUM 8.1* 7.8* 7.7*  MG  --  2.0  --   PHOS  --  3.0  --    Liver Function Tests: Recent Labs  Lab 01/04/21 0322 01/05/21 0320  AST 17 14*  ALT 17 14  ALKPHOS 42 38  BILITOT 1.2 0.9  PROT 5.2* 4.9*  ALBUMIN 2.4* 2.3*   Coagulation  Profile: Recent Labs  Lab 01/05/21 0320  INR 1.5*   HbA1C: Recent Labs    01/04/21 0322  HGBA1C 4.1*   CBG: No results for input(s): GLUCAP in the last 168 hours.  Recent Results (from the past 240 hour(s))  Culture, blood (Routine X 2) w Reflex to ID Panel     Status: None (Preliminary result)   Collection Time: 01/04/21  3:04 PM   Specimen: BLOOD LEFT HAND  Result Value Ref Range Status   Specimen Description BLOOD LEFT HAND  Final   Special Requests   Final    BOTTLES DRAWN AEROBIC AND ANAEROBIC Blood Culture results may not be optimal due to an inadequate volume of blood received in culture bottles  Culture  Setup Time   Final    GRAM POSITIVE COCCI IN CHAINS IN BOTH AEROBIC AND ANAEROBIC BOTTLES CRITICAL RESULT CALLED TO, READ BACK BY AND VERIFIED WITH: J,MILLEN PHARMD @0851  01/05/21 EB    Culture   Final    GRAM POSITIVE COCCI IDENTIFICATION AND SUSCEPTIBILITIES TO FOLLOW Performed at [facility], [place] [address], [place], [place] [address]    Report Status PENDING  Incomplete  Blood Culture ID Panel (Reflexed)     Status: Abnormal   Collection Time: 01/04/21  3:04 PM  Result Value Ref Range Status   Enterococcus faecalis NOT DETECTED NOT DETECTED Final   Enterococcus Faecium NOT DETECTED NOT DETECTED Final   Listeria monocytogenes NOT DETECTED NOT DETECTED Final   Staphylococcus species NOT DETECTED NOT DETECTED Final   Staphylococcus aureus (BCID) NOT DETECTED NOT DETECTED Final   Staphylococcus epidermidis NOT DETECTED NOT DETECTED Final   Staphylococcus lugdunensis NOT DETECTED NOT DETECTED Final   Streptococcus species DETECTED (A) NOT DETECTED Final    Comment: Not Enterococcus species, Streptococcus agalactiae, Streptococcus pyogenes, or Streptococcus pneumoniae. CRITICAL RESULT CALLED TO, READ BACK BY AND VERIFIED WITH: J,MILLEN PHARMD @0851  01/05/21 EB    Streptococcus agalactiae NOT DETECTED NOT DETECTED Final   Streptococcus pneumoniae NOT  DETECTED NOT DETECTED Final   Streptococcus pyogenes NOT DETECTED NOT DETECTED Final   A.calcoaceticus-baumannii NOT DETECTED NOT DETECTED Final   Bacteroides fragilis NOT DETECTED NOT DETECTED Final   Enterobacterales NOT DETECTED NOT DETECTED Final   Enterobacter cloacae complex NOT DETECTED NOT DETECTED Final   Escherichia coli NOT DETECTED NOT DETECTED Final   Klebsiella aerogenes NOT DETECTED NOT DETECTED Final   Klebsiella oxytoca NOT DETECTED NOT DETECTED Final   Klebsiella pneumoniae NOT DETECTED NOT DETECTED Final   Proteus species NOT DETECTED NOT DETECTED Final   Salmonella species NOT DETECTED NOT DETECTED Final   Serratia marcescens NOT DETECTED NOT DETECTED Final   Haemophilus influenzae NOT DETECTED NOT DETECTED Final   Neisseria meningitidis NOT DETECTED NOT DETECTED Final   Pseudomonas aeruginosa NOT DETECTED NOT DETECTED Final   Stenotrophomonas maltophilia NOT DETECTED NOT DETECTED Final   Candida albicans NOT DETECTED NOT DETECTED Final   Candida auris NOT DETECTED NOT DETECTED Final   Candida glabrata NOT DETECTED NOT DETECTED Final   Candida krusei NOT DETECTED NOT DETECTED Final   Candida parapsilosis NOT DETECTED NOT DETECTED Final   Candida tropicalis NOT DETECTED NOT DETECTED Final   Cryptococcus neoformans/gattii NOT DETECTED NOT DETECTED Final    Comment: Performed at [facility], [address] [place], [place] [address]  Culture, blood (Routine X 2) w Reflex to ID Panel     Status: None (Preliminary result)   Collection Time: 01/04/21  3:11 PM   Specimen: BLOOD LEFT HAND  Result Value Ref Range Status   Specimen Description BLOOD LEFT HAND  Final   Special Requests   Final    BOTTLES DRAWN AEROBIC ONLY Blood Culture adequate volume   Culture  Setup Time   Final    GRAM POSITIVE COCCI IN CHAINS AEROBIC BOTTLE ONLY CRITICAL VALUE NOTED.  VALUE IS CONSISTENT WITH PREVIOUSLY REPORTED AND CALLED VALUE.    Culture   Final    GRAM POSITIVE  COCCI IDENTIFICATION TO FOLLOW Performed at [facility], [place] [address] [place], [place] [address]    Report Status PENDING  Incomplete  Resp Panel by RT-PCR (Flu A&B, Covid) Nasopharyngeal Swab     Status: None  Collection Time: 01/05/21  6:31 AM   Specimen: Nasopharyngeal Swab; Nasopharyngeal(NP) swabs in vial transport medium  Result Value Ref Range Status   SARS Coronavirus 2 by RT PCR NEGATIVE NEGATIVE Final    Comment: (NOTE) SARS-CoV-2 target nucleic acids are NOT DETECTED.  The SARS-CoV-2 RNA is generally detectable in upper respiratory specimens during the acute phase of infection. The lowest concentration of SARS-CoV-2 viral copies this assay can detect is 138 copies/mL. A negative result does not preclude SARS-Cov-2 infection and should not be used as the sole basis for treatment or other patient management decisions. A negative result may occur with  improper specimen collection/handling, submission of specimen other than nasopharyngeal swab, presence of viral mutation(s) within the areas targeted by this assay, and inadequate number of viral copies(<138 copies/mL). A negative result must be combined with clinical observations, patient history, and epidemiological information. The expected result is Negative.  Fact Sheet for Patients:  EntrepreneurPulse.com.au  Fact Sheet for Healthcare Providers:  IncredibleEmployment.be  This test is no t yet approved or cleared by the [place] FDA and  has been authorized for detection and/or diagnosis of SARS-CoV-2 by FDA under an Emergency Use Authorization (EUA). This EUA will remain  in effect (meaning this test can be used) for the duration of the COVID-19 declaration under Section 564(b)(1) of the Act, 21 U.S.C.section 360bbb-3(b)(1), unless the authorization is terminated  or revoked sooner.       Influenza A by PCR NEGATIVE NEGATIVE Final   Influenza B by PCR NEGATIVE  NEGATIVE Final    Comment: (NOTE) The Xpert Xpress SARS-CoV-2/FLU/RSV plus assay is intended as an aid in the diagnosis of influenza from Nasopharyngeal swab specimens and should not be used as a sole basis for treatment. Nasal washings and aspirates are unacceptable for Xpert Xpress SARS-CoV-2/FLU/RSV testing.  Fact Sheet for Patients: EntrepreneurPulse.com.au  Fact Sheet for Healthcare Providers: IncredibleEmployment.be  This test is not yet approved or cleared by the [place] FDA and has been authorized for detection and/or diagnosis of SARS-CoV-2 by FDA under an Emergency Use Authorization (EUA). This EUA will remain in effect (meaning this test can be used) for the duration of the COVID-19 declaration under Section 564(b)(1) of the Act, 21 U.S.C. section 360bbb-3(b)(1), unless the authorization is terminated or revoked.  Performed at [facility], [place] [address], [place], [place] [address]      Radiology Studies: DG CHEST PORT 1 VIEW  Result Date: 01/05/2021 CLINICAL DATA:  Shortness of breath EXAM: PORTABLE CHEST 1 VIEW COMPARISON:  01/03/2021 FINDINGS: Prior median sternotomy and valve replacement. Heart is normal size. Hyperinflation/COPD. Bibasilar opacities. No effusions. No acute bony abnormality. IMPRESSION: COPD. Bibasilar atelectasis or infiltrates, new since prior study. Electronically Signed   By: Rolm Baptise M.D.   On: 01/05/2021 15:35    Marzetta Board, MD, PhD [facility]  Between 7 am - 7 pm I am available, please contact me via Amion or Securechat  Between 7 pm - 7 am I am not available, please contact night coverage MD/APP via Amion

## 2021-01-06 NOTE — Plan of Care (Signed)
  Problem: Education: Goal: Knowledge of secondary prevention will improve Outcome: Progressing Goal: Knowledge of patient specific risk factors addressed and post discharge goals established will improve Outcome: Progressing   

## 2021-01-06 NOTE — Evaluation (Addendum)
Occupational Therapy Evaluation Patient Details Name: Donald Harrington MRN: [id number] DOB: October 26, 1955 Today's Date: 01/06/2021    History of Present Illness 65 y.o. male presenting from outpatient MRI showing multifocal acute embolic strokes presents (subcentimeter bilateral cerebral and cerebellar infarcts). Also presenting with acute anemia and dark stools concerning for a GI bleed. Chest x-ray impression of COPD and bibasilar atelectasis or infiltrates, new since prior study. 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   Clinical Impression   PTA, pt was living alone and was performing ADLs, IADLs, and driving short distances; use of SPC for mobility. Pt currently requiring Min guard A for ADLs and functional mobility. Pt presenting with poor cognition, balance, strength, and functional use of LUE. Pt reporting he has numbness and weakness at LUE impacting his everyday such as "pulling coins out of my pocket." Pt will require further acute OT to facilitate safe dc and address LUE deficits. Recommend dc to CIR for intensive OT to optimize safety, independence with ADLs, and return to PLOF reaching a Mod I level for home by himself.     Follow Up Recommendations  CIR (Pending progress, HHOT)    Equipment Recommendations  Tub/shower seat    Recommendations for Other Services PT consult;Speech consult     Precautions / Restrictions Precautions Precautions: Fall Precaution Comments: HOH; HR < 130 (return to bed if >130) Restrictions Weight Bearing Restrictions: No      Mobility Bed Mobility Overal bed mobility: Modified Independent             General bed mobility comments: Pt performing bed mobility with increased time. Educating pt on log roll technique for reducing neck pain but pt prefering his own method     Transfers Overall transfer level: Needs assistance Equipment used: Rolling walker (2 wheeled) Transfers: Sit to/from Stand Sit to Stand: Min guard         General transfer comment: Min guard for safety. Pt with poor RW management nad pulling up on RW. No difficulty however    Balance Overall balance assessment: Needs assistance Sitting-balance support: No upper extremity supported;Feet supported Sitting balance-Leahy Scale: Good     Standing balance support: Bilateral upper extremity supported Standing balance-Leahy Scale: Poor Standing balance comment: Using UE support                           ADL either performed or assessed with clinical judgement   ADL Overall ADL's : Needs assistance/impaired Eating/Feeding: Set up;Supervision/ safety;Sitting   Grooming: Min guard;Standing   Upper Body Bathing: Min guard;Sitting   Lower Body Bathing: Min guard;Sit to/from stand   Upper Body Dressing : Min guard;Sitting   Lower Body Dressing: Min guard;Sit to/from stand Lower Body Dressing Details (indicate cue type and reason): Min Guard A for safety. Pt able to bend forward and pull up L sock with RUE Toilet Transfer: Min guard;Ambulation;RW (simulated in room)           Functional mobility during ADLs: Min guard;Rolling walker General ADL Comments: Pt presenting with decreased balance, strength, and functional use of LUE.     Vision Baseline Vision/History: Wears glasses Wears Glasses: Reading only Patient Visual Report: No change from baseline       Perception     Praxis      Pertinent Vitals/Pain  Pain Assessment: Faces Faces Pain Scale: Hurts even more Pain Location: neck Pain Descriptors / Indicators: Aching;Discomfort;Grimacing;Guarding Pain Intervention(s): Monitored during session;Limited activity within patient's tolerance;Repositioned;Heat applied     Hand Dominance Right   Extremity/Trunk Assessment Upper Extremity Assessment Upper  Extremity Assessment: LUE deficits/detail LUE Deficits / Details: Reports numbness at fingertips. Poor FM coorindation. Poor grasp and overall strength LUE Coordination: decreased fine motor;decreased gross motor   Lower Extremity Assessment Lower Extremity Assessment: Defer to PT evaluation   Cervical / Trunk Assessment Cervical / Trunk Assessment: Other exceptions Cervical / Trunk Exceptions: cervical pain   Communication Communication Communication: Expressive difficulties   Cognition Arousal/Alertness: Awake/alert Behavior During Therapy: WFL for tasks assessed/performed (Pt frustrated about situation) Overall Cognitive Status: Impaired/Different from baseline Area of Impairment: Memory;Problem solving                     Memory: Decreased short-term memory       Problem Solving: Slow processing;Requires verbal cues General Comments: Pt presenting with tangiental thoughts and quick emotional changes. Benefits from calm, simple cues. Pt requiring increased time for processing   General Comments  VSS on RA    Exercises     Shoulder Instructions      Home Living Family/patient expects to be discharged to:: Private residence Living Arrangements: Alone   Type of Home: Apartment Home Access: Level entry     Home Layout: One level     Bathroom Shower/Tub: Occupational psychologist: Standard     Home Equipment: Cane - single point;Walker - 2 wheels          Prior Functioning/Environment Level of Independence: Independent with assistive device(s)        Comments: ADLs, IADLs, and driving short distance "around my town." Use of [facility] for functional mobility        OT Problem List: Decreased strength;Decreased range of motion;Decreased activity tolerance;Impaired balance (sitting and/or standing);Decreased cognition;Decreased safety awareness;Decreased knowledge of use of DME or AE;Decreased knowledge of precautions;Impaired UE functional use       OT Treatment/Interventions: Self-care/ADL training;Therapeutic exercise;Energy conservation;DME and/or AE instruction;Therapeutic activities;Patient/family education;Balance training;Cognitive remediation/compensation    OT Goals(Current goals can be found in the care plan section) Acute Rehab OT Goals Patient Stated Goal: "Next time therapy comes, I am going to walk out in that hallway" OT Goal Formulation: With patient Time For Goal Achievement: 01/20/21 Potential to Achieve Goals: Good  OT Frequency: Min 3X/week   Barriers to D/C:            Co-evaluation              AM-PAC OT "6 Clicks" Daily Activity     Outcome Measure Help from another person eating meals?: None Help from another person taking care of personal grooming?: A Little Help from another person toileting, which includes using toliet, bedpan, or urinal?: A Little Help from another person bathing (including washing, rinsing, drying)?: A Little Help from another person to put on and taking off regular upper body clothing?: A Little Help from another person to put on and taking off regular lower body clothing?: A Little 6 Click Score: 19   End of Session Equipment Utilized During Treatment: Rolling walker Nurse Communication: Mobility status  Activity Tolerance: Patient tolerated treatment well Patient left: with call bell/phone within reach;in bed;with bed alarm set  OT Visit Diagnosis: Unsteadiness on feet (R26.81);Other abnormalities of gait and mobility (R26.89);Muscle weakness (generalized) (M62.81);Hemiplegia and hemiparesis;Pain Hemiplegia - Right/Left: Left  Hemiplegia - dominant/non-dominant: Non-Dominant Hemiplegia - caused by: Cerebral infarction Pain - part of body:  (cervical spine and HA)                Time: 3612-2449 OT Time Calculation (min): 23 min Charges:  OT General Charges $OT Visit: 1 Visit OT Evaluation $OT Eval Moderate Complexity: 1 Mod OT Treatments $Self Care/Home  Management : 8-22 mins  Charis Capehart MSOT, OTR/L Acute Rehab Pager: [phone] Office: [place] 01/06/2021, 1:19 PM

## 2021-01-06 NOTE — Consult Note (Signed)
[facility]    Date of Admission:  01/03/2021     Total days of antibiotics 2  Ceftriaxone                Reason for Consult: streptococcal bacteremia, bioprosthetic AV/MV   Referring Provider: Cruzita Lederer  Primary Care Provider: Patient, No Pcp Per   Assessment: Donald Harrington is a 65 y.o. male with 1 month history of headaches and neck pain. Founds to have leucopenia and fevers in addition to +FOBT and anemia. Streptococcal species growing in the blood in 3/4 bottles.   Still waiting on species / susceptibilities. While strep is may not be overall as likely as staph to cause endocarditis there are certainly several families of strep that are more a/w risk for endocarditis than others. 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 Increase Ceftriaxone to BID dosing for CNS penetration given acute strokes.  Follow TTE results - needs TEE if results are negative or inconclusive.    Plan: 1. Increase ceftriaxone to BID dosing 2. Add gentamicin for PVE a/w strep  3. Follow micro for susceptibilities  4. Follow cardiac imaging 5. Follow SCr while on gent.     Principal Problem:   Acute blood loss anemia Active Problems:   Acute embolic stroke (HCC)   GI bleed   Cough   PAF (paroxysmal atrial fibrillation) (HCC)   History of aortic valve replacement   History of mitral valve replacement   Bacteremia due to Streptococcus   Duodenal ulcer   Acute gastric erosion   . sodium chloride   Intravenous Once  . bisacodyl  20 mg Oral Once  . melatonin  5 mg Oral QHS  . metoprolol tartrate  25 mg Oral BID  . pantoprazole  40 mg Intravenous Q12H    HPI: Donald Harrington is a 65 y.o. male admitted from home for evaluation of severe headaches.   He states that his illness started about a month ago with severe sudden onset neck pain. He went for evaluation twice in [facility] and underwent  MRI of c-spine and treated with steroids for arthritis. Came to ER at [facility] on 3/17 last week for evaluation of headaches and revealed multifocal acute embolic strokes in various parts of the brain. Also reported some darkening stools that occurred prior to this w/o abdominal pain. Has "few teeth" but not seen a dentist in some time. No current odontogenic complaints. No malaise, fatigue, chills, sweats, fevers to his knowledge.   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 During present admission he had blood cultures drawn and growing out gram positive cocci in chains from 3/4 bottles with BCID revealing streptococcal species.    Review of Systems: Review of Systems  Constitutional: Negative for chills, fever and malaise/fatigue.  HENT: Negative for tinnitus.   Eyes: Negative for blurred vision and photophobia.  Respiratory: Negative for cough and sputum production.   Cardiovascular: Negative for chest pain and leg swelling.  Gastrointestinal: Negative for abdominal pain, diarrhea, nausea and vomiting.  Genitourinary: Negative for dysuria.  Musculoskeletal: Positive for neck pain. Negative for back pain, joint pain and myalgias.  Skin: Negative for rash.  Neurological: Positive for headaches. Negative for focal weakness and weakness.    Past Medical History:  Diagnosis  Date  . Alcohol abuse   . Bladder cancer ([place])    remission s/p TURBT  . CAD (coronary artery disease), native coronary artery    MI in setting of endocarditis  . 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 Endocarditis    Mitral and Aortic valve 2015  . HTN (hypertension)   . Hypothyroidism   . NICM (nonischemic cardiomyopathy) ([place])    Normal EF as of 2020 echo  . PAF (paroxysmal atrial fibrillation) (HCC)    coumadin anticoagulation due to h/o valve replacements with  bioprosthetic valves  . PUD (peptic ulcer disease)    causing GIB in 2015    Social History   Tobacco Use  . Smoking status: Current Every Day Smoker    Packs/day: 1.00    Years: 40.00    Pack years: 40.00  . Smokeless tobacco: Never Used  Substance Use Topics  . Alcohol use: Not Currently    Comment: last 2017  . Drug use: Never    Family History  Problem Relation Age of Onset  . Thyroid disease Sister    No Known Allergies  OBJECTIVE: Blood pressure 120/78, pulse 91, temperature 98.5 F (36.9 C), temperature source Oral, resp. rate 19, height 5' 10.5" (1.791 m), weight 74.8 kg, SpO2 97 %.   Physical Exam Vitals reviewed.  Constitutional:      Appearance: He is well-developed.     Comments: Seated comfortably in chair during visit.   HENT:     Mouth/Throat:     Mouth: Mucous membranes are moist.     Dentition: Normal dentition. No dental abscesses.     Pharynx: Oropharynx is clear.  Eyes:     General: No scleral icterus.    Pupils: Pupils are equal, round, and reactive to light.  Cardiovascular:     Rate and Rhythm: Normal rate and regular rhythm.     Heart sounds: Murmur heard.    Pulmonary:     Effort: Pulmonary effort is normal.     Breath sounds: Normal breath sounds.  Abdominal:     General: There is no distension.     Palpations: Abdomen is soft.     Tenderness: There is no abdominal tenderness.  Lymphadenopathy:     Cervical: No cervical adenopathy.  Skin:    General: Skin is warm and dry.     Findings: No rash.  Neurological:     Mental Status: He is alert and oriented to person, place, and time.  Psychiatric:        Judgment: Judgment normal.     Lab Results Lab Results  Component Value Date   WBC 3.3 (L) 01/06/2021   HGB 8.2 (L) 01/06/2021   HCT 25.7 (L) 01/06/2021   MCV 102.0 (H) 01/06/2021   PLT 120 (L) 01/06/2021    Lab Results  Component Value Date   CREATININE 1.06 01/06/2021   BUN 15 01/06/2021   NA 135 01/06/2021   K  3.6 01/06/2021   CL 105 01/06/2021   CO2 23 01/06/2021    Lab Results  Component Value Date   ALT 14 01/05/2021   AST 14 (L) 01/05/2021   ALKPHOS 38 01/05/2021   BILITOT 0.9 01/05/2021     Microbiology: Recent Results (from the past 240 hour(s))  Culture, blood (Routine X 2) w Reflex to ID Panel     Status: None (Preliminary result)   Collection Time: 01/04/21  3:04 PM   Specimen: BLOOD LEFT HAND  Result  Value Ref Range Status   Specimen Description BLOOD LEFT HAND  Final   Special Requests   Final    BOTTLES DRAWN AEROBIC AND ANAEROBIC Blood Culture results may not be optimal due to an inadequate volume of blood received in culture bottles   Culture  Setup Time   Final    GRAM POSITIVE COCCI IN CHAINS IN BOTH AEROBIC AND ANAEROBIC BOTTLES CRITICAL RESULT CALLED TO, READ BACK BY AND VERIFIED WITH: J,MILLEN PHARMD @0851  01/05/21 EB    Culture   Final    GRAM POSITIVE COCCI IDENTIFICATION AND SUSCEPTIBILITIES TO FOLLOW Performed at [facility], [place] [address], [place], [place] [address]    Report Status PENDING  Incomplete  Blood Culture ID Panel (Reflexed)     Status: Abnormal   Collection Time: 01/04/21  3:04 PM  Result Value Ref Range Status   Enterococcus faecalis NOT DETECTED NOT DETECTED Final   Enterococcus Faecium NOT DETECTED NOT DETECTED Final   Listeria monocytogenes NOT DETECTED NOT DETECTED Final   Staphylococcus species NOT DETECTED NOT DETECTED Final   Staphylococcus aureus (BCID) NOT DETECTED NOT DETECTED Final   Staphylococcus epidermidis NOT DETECTED NOT DETECTED Final   Staphylococcus lugdunensis NOT DETECTED NOT DETECTED Final   Streptococcus species DETECTED (A) NOT DETECTED Final    Comment: Not Enterococcus species, Streptococcus agalactiae, Streptococcus pyogenes, or Streptococcus pneumoniae. CRITICAL RESULT CALLED TO, READ BACK BY AND VERIFIED WITH: J,MILLEN PHARMD @0851  01/05/21 EB    Streptococcus agalactiae NOT DETECTED NOT DETECTED Final    Streptococcus pneumoniae NOT DETECTED NOT DETECTED Final   Streptococcus pyogenes NOT DETECTED NOT DETECTED Final   A.calcoaceticus-baumannii NOT DETECTED NOT DETECTED Final   Bacteroides fragilis NOT DETECTED NOT DETECTED Final   Enterobacterales NOT DETECTED NOT DETECTED Final   Enterobacter cloacae complex NOT DETECTED NOT DETECTED Final   Escherichia coli NOT DETECTED NOT DETECTED Final   Klebsiella aerogenes NOT DETECTED NOT DETECTED Final   Klebsiella oxytoca NOT DETECTED NOT DETECTED Final   Klebsiella pneumoniae NOT DETECTED NOT DETECTED Final   Proteus species NOT DETECTED NOT DETECTED Final   Salmonella species NOT DETECTED NOT DETECTED Final   Serratia marcescens NOT DETECTED NOT DETECTED Final   Haemophilus influenzae NOT DETECTED NOT DETECTED Final   Neisseria meningitidis NOT DETECTED NOT DETECTED Final   Pseudomonas aeruginosa NOT DETECTED NOT DETECTED Final   Stenotrophomonas maltophilia NOT DETECTED NOT DETECTED Final   Candida albicans NOT DETECTED NOT DETECTED Final   Candida auris NOT DETECTED NOT DETECTED Final   Candida glabrata NOT DETECTED NOT DETECTED Final   Candida krusei NOT DETECTED NOT DETECTED Final   Candida parapsilosis NOT DETECTED NOT DETECTED Final   Candida tropicalis NOT DETECTED NOT DETECTED Final   Cryptococcus neoformans/gattii NOT DETECTED NOT DETECTED Final    Comment: Performed at [facility], [address] [place], [place] [address]  Culture, blood (Routine X 2) w Reflex to ID Panel     Status: None (Preliminary result)   Collection Time: 01/04/21  3:11 PM   Specimen: BLOOD LEFT HAND  Result Value Ref Range Status   Specimen Description BLOOD LEFT HAND  Final   Special Requests   Final    BOTTLES DRAWN AEROBIC ONLY Blood Culture adequate volume   Culture  Setup Time   Final    GRAM POSITIVE COCCI IN CHAINS AEROBIC BOTTLE ONLY CRITICAL VALUE NOTED.  VALUE IS CONSISTENT WITH PREVIOUSLY REPORTED AND CALLED VALUE.    Culture  Final    GRAM POSITIVE COCCI IDENTIFICATION TO FOLLOW Performed at [facility], [place] [address] [place], [place] [address]    Report Status PENDING  Incomplete  Resp Panel by RT-PCR (Flu A&B, Covid) Nasopharyngeal Swab     Status: None   Collection Time: 01/05/21  6:31 AM   Specimen: Nasopharyngeal Swab; Nasopharyngeal(NP) swabs in vial transport medium  Result Value Ref Range Status   SARS Coronavirus 2 by RT PCR NEGATIVE NEGATIVE Final    Comment: (NOTE) SARS-CoV-2 target nucleic acids are NOT DETECTED.  The SARS-CoV-2 RNA is generally detectable in upper respiratory specimens during the acute phase of infection. The lowest concentration of SARS-CoV-2 viral copies this assay can detect is 138 copies/mL. A negative result does not preclude SARS-Cov-2 infection and should not be used as the sole basis for treatment or other patient management decisions. A negative result may occur with  improper specimen collection/handling, submission of specimen other than nasopharyngeal swab, presence of viral mutation(s) within the areas targeted by this assay, and inadequate number of viral copies(<138 copies/mL). A negative result must be combined with clinical observations, patient history, and epidemiological information. The expected result is Negative.  Fact Sheet for Patients:  EntrepreneurPulse.com.au  Fact Sheet for Healthcare Providers:  IncredibleEmployment.be  This test is no t yet approved or cleared by the [place] FDA and  has been authorized for detection and/or diagnosis of SARS-CoV-2 by FDA under an Emergency Use Authorization (EUA). This EUA will remain  in effect (meaning this test can be used) for the duration of the COVID-19 declaration under Section 564(b)(1) of the Act, 21 U.S.C.section 360bbb-3(b)(1), unless the authorization is terminated  or revoked sooner.       Influenza A by PCR NEGATIVE NEGATIVE Final    Influenza B by PCR NEGATIVE NEGATIVE Final    Comment: (NOTE) The Xpert Xpress SARS-CoV-2/FLU/RSV plus assay is intended as an aid in the diagnosis of influenza from Nasopharyngeal swab specimens and should not be used as a sole basis for treatment. Nasal washings and aspirates are unacceptable for Xpert Xpress SARS-CoV-2/FLU/RSV testing.  Fact Sheet for Patients: EntrepreneurPulse.com.au  Fact Sheet for Healthcare Providers: IncredibleEmployment.be  This test is not yet approved or cleared by the [place] FDA and has been authorized for detection and/or diagnosis of SARS-CoV-2 by FDA under an Emergency Use Authorization (EUA). This EUA will remain in effect (meaning this test can be used) for the duration of the COVID-19 declaration under Section 564(b)(1) of the Act, 21 U.S.C. section 360bbb-3(b)(1), unless the authorization is terminated or revoked.  Performed at [facility], [place] [address] [place], [place] [address]     Janene Madeira, MSN, NP-C [facility] [place] Cell: [phone] Pager: [phone]  01/06/2021 12:39 PM

## 2021-01-06 NOTE — Care Management Important Message (Signed)
Important Message  Patient Details  Name: Donald Harrington MRN: [id number] Date of Birth: [birth year]   Medicare Important Message Given:  Yes     Marvella Jenning [address] 01/06/2021, 3:25 PM

## 2021-01-06 NOTE — Progress Notes (Signed)
ANTICOAGULATION CONSULT NOTE   Pharmacy Consult for Heparin Indication: atrial fibrillation and stroke  No Known Allergies  Patient Measurements: Height: 5' 10.5" (179.1 cm) Weight: 74.8 kg (165 lb) IBW/kg (Calculated) : 74.15 Heparin Dosing Weight: 74 kg  Vital Signs: Temp: 98.5 F (36.9 C) (03/21 1929) Temp Source: Axillary (03/21 1929) BP: 118/82 (03/21 1929) Pulse Rate: 91 (03/21 1229)  Labs: Recent Labs    01/04/21 0322 01/04/21 1511 01/05/21 0320 01/06/21 0410 01/06/21 1842  HGB 7.0* 7.8* 7.6* 8.2*  --   HCT 21.4* 24.0* 23.2* 25.7*  --   PLT 125*  --  124* 120*  --   LABPROT  --   --  17.3*  --   --   INR  --   --  1.5*  --   --   HEPARINUNFRC  --   --   --   --  <0.10*  CREATININE 1.29*  --  1.11 1.06  --     Estimated Creatinine Clearance: 71.9 mL/min (by C-G formula based on SCr of 1.06 mg/dL).   Medical History: Past Medical History:  Diagnosis Date  . Alcohol abuse   . Bladder cancer ([place])    remission s/p TURBT  . CAD (coronary artery disease), native coronary artery    MI in setting of endocarditis  . 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 Endocarditis    Mitral and Aortic valve 2015  . HTN (hypertension)   . Hypothyroidism   . NICM (nonischemic cardiomyopathy) ([place])    Normal EF as of 2020 echo  . PAF (paroxysmal atrial fibrillation) (HCC)    coumadin anticoagulation due to h/o valve replacements with bioprosthetic valves  . PUD (peptic ulcer disease)    causing GIB in 2015    Assessment: 64 YOM on warfarin PTA for hx Afib, prior CVA who presented on 3/18 with new strokes on MRI, concern for GIB. The patient is s/p EGD on 3/20 which showed erosive gastropathy and non-bleeding duodenal ulcer. GI okayed restart of anticoagulation. Pharmacy consulted to start Heparin today for bridging - no bolus and low goal with CVA + GIB eval.  The MD does not plan to resume Warfarin at this time as thinks the patient would likely benefit from  transition to Apixaban.   Initial heparin level this evening undetectable.  Confirmed with RN no known issues with IV infusion.  Goal of Therapy:  Heparin level 0.3-0.5 units/ml Monitor platelets by anticoagulation protocol: Yes   Plan:  - Increase IV heparin to 1100 units/hr. - Will aim for low goal and no bolus with CVA - Will continue to monitor for any signs/symptoms of bleeding and will follow up with heparin level in 6 hours   Thank you for allowing pharmacy to be a part of this patient's care.  [place] Crane, Roylene Reason, BCCP Clinical Pharmacist  01/06/2021 7:38 PM   [facility] pharmacy phone numbers are listed on [email or web address]

## 2021-01-06 NOTE — Progress Notes (Signed)
ANTICOAGULATION CONSULT NOTE - Initial Consult  Pharmacy Consult for Heparin Indication: atrial fibrillation and stroke  No Known Allergies  Patient Measurements: Height: 5' 10.5" (179.1 cm) Weight: 74.8 kg (165 lb) IBW/kg (Calculated) : 74.15 Heparin Dosing Weight: 74 kg  Vital Signs: Temp: 98.4 F (36.9 C) (03/21 0720) Temp Source: Oral (03/21 0720) BP: 109/68 (03/21 0720) Pulse Rate: 78 (03/21 0720)  Labs: Recent Labs    01/04/21 0322 01/04/21 1511 01/05/21 0320 01/06/21 0410  HGB 7.0* 7.8* 7.6* 8.2*  HCT 21.4* 24.0* 23.2* 25.7*  PLT 125*  --  124* 120*  LABPROT  --   --  17.3*  --   INR  --   --  1.5*  --   CREATININE 1.29*  --  1.11 1.06    Estimated Creatinine Clearance: 71.9 mL/min (by C-G formula based on SCr of 1.06 mg/dL).   Medical History: Past Medical History:  Diagnosis Date  . Alcohol abuse   . Bladder cancer ([place])    remission s/p TURBT  . CAD (coronary artery disease), native coronary artery    MI in setting of endocarditis  . 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 Endocarditis    Mitral and Aortic valve 2015  . HTN (hypertension)   . Hypothyroidism   . NICM (nonischemic cardiomyopathy) ([place])    Normal EF as of 2020 echo  . PAF (paroxysmal atrial fibrillation) (HCC)    coumadin anticoagulation due to h/o valve replacements with bioprosthetic valves  . PUD (peptic ulcer disease)    causing GIB in 2015    Assessment: 64 YOM on warfarin PTA for hx Afib, prior CVA who presented on 3/18 with new strokes on MRI, concern for GIB. The patient is s/p EGD on 3/20 which showed erosive gastropathy and non-bleeding duodenal ulcer. GI okayed restart of anticoagulation. Pharmacy consulted to start Heparin today for bridging - no bolus and low goal with CVA + GIB eval.  The MD does not plan to resume Warfarin at this time as thinks the patient would likely benefit from transition to Apixaban.   Goal of Therapy:  Heparin level 0.3-0.5  units/ml Monitor platelets by anticoagulation protocol: Yes   Plan:  - Start Heparin at 900 units/hr (9 ml/hr) - No warfarin restart for now - MD evaluating transition to Apixaban - Will aim for low goal and no bolus with CVA - Will continue to monitor for any signs/symptoms of bleeding and will follow up with heparin level in 6 hours   Thank you for allowing pharmacy to be a part of this patient's care.  Alycia Rossetti, PharmD, BCPS Clinical Pharmacist Clinical phone for 01/06/2021: [phone] 01/06/2021 11:24 AM   **Pharmacist phone directory can now be found on [email or web address] ([email or web address]).  Listed under [place].

## 2021-01-06 NOTE — Progress Notes (Signed)
Pharmacy Antibiotic Note  Donald Harrington is a 65 y.o. male admitted on 01/03/2021 with strokes  Pharmacy has been consulted for gentamicin dosing.  Anticipated gentamicin peak 3.6, trough 0.02, with Scr of 1.06. We will also increase his ceftriaxone dose to q12h given the concern for septic infarcts to the brain. Continue to follow for speciation of the strep species   Plan: Gentamicin 80mg  IV q24h Ceftriaxone 2g IV q12h  Continue to monitor Scr, levels as appropriate  Height: 5' 10.5" (179.1 cm) Weight: 74.8 kg (165 lb) IBW/kg (Calculated) : 74.15  Temp (24hrs), Avg:98.9 F (37.2 C), Min:97.8 F (36.6 C), Max:100.3 F (37.9 C)  Recent Labs  Lab 01/04/21 0322 01/05/21 0320 01/06/21 0410  WBC 3.5* 4.1 3.3*  CREATININE 1.29* 1.11 1.06    Estimated Creatinine Clearance: 71.9 mL/min (by C-G formula based on SCr of 1.06 mg/dL).    No Known Allergies  Antimicrobials this admission: Ceftriaxone 3/20 >> Gentamicin 3/21>>  Microbiology results: 3/19 BCx: GPC  Thank you for allowing pharmacy to be a part of this patient's care.  Marliss Czar [facility] 01/06/2021 10:45 AM

## 2021-01-06 NOTE — Progress Notes (Signed)
  Echocardiogram 2D Echocardiogram has been performed.  Merrie Roof F 01/06/2021, 11:38 AM

## 2021-01-07 ENCOUNTER — Inpatient Hospital Stay (HOSPITAL_COMMUNITY): Payer: Medicare HMO

## 2021-01-07 DIAGNOSIS — D649 Anemia, unspecified: Secondary | ICD-10-CM

## 2021-01-07 DIAGNOSIS — I480 Paroxysmal atrial fibrillation: Secondary | ICD-10-CM | POA: Diagnosis not present

## 2021-01-07 DIAGNOSIS — K922 Gastrointestinal hemorrhage, unspecified: Secondary | ICD-10-CM | POA: Diagnosis not present

## 2021-01-07 DIAGNOSIS — R7881 Bacteremia: Secondary | ICD-10-CM | POA: Diagnosis not present

## 2021-01-07 DIAGNOSIS — I059 Rheumatic mitral valve disease, unspecified: Secondary | ICD-10-CM | POA: Diagnosis not present

## 2021-01-07 DIAGNOSIS — I639 Cerebral infarction, unspecified: Secondary | ICD-10-CM | POA: Diagnosis not present

## 2021-01-07 DIAGNOSIS — Z952 Presence of prosthetic heart valve: Secondary | ICD-10-CM | POA: Diagnosis not present

## 2021-01-07 DIAGNOSIS — D62 Acute posthemorrhagic anemia: Secondary | ICD-10-CM | POA: Diagnosis not present

## 2021-01-07 LAB — COMPREHENSIVE METABOLIC PANEL WITH GFR
ALT: 11 U/L (ref 0–44)
AST: 12 U/L — ABNORMAL LOW (ref 15–41)
Albumin: 2 g/dL — ABNORMAL LOW (ref 3.5–5.0)
Alkaline Phosphatase: 39 U/L (ref 38–126)
Anion gap: 7 (ref 5–15)
BUN: 12 mg/dL (ref 8–23)
CO2: 23 mmol/L (ref 22–32)
Calcium: 7.7 mg/dL — ABNORMAL LOW (ref 8.9–10.3)
Chloride: 109 mmol/L (ref 98–111)
Creatinine, Ser: 1.01 mg/dL (ref 0.61–1.24)
GFR, Estimated: >60 mL/min
Glucose, Bld: 103 mg/dL — ABNORMAL HIGH (ref 70–99)
Potassium: 4 mmol/L (ref 3.5–5.1)
Sodium: 139 mmol/L (ref 135–145)
Total Bilirubin: 0.4 mg/dL (ref 0.3–1.2)
Total Protein: 4.8 g/dL — ABNORMAL LOW (ref 6.5–8.1)

## 2021-01-07 LAB — CBC
HCT: 26.7 % — ABNORMAL LOW (ref 39.0–52.0)
Hemoglobin: 8.8 g/dL — ABNORMAL LOW (ref 13.0–17.0)
MCH: 33.3 pg (ref 26.0–34.0)
MCHC: 33 g/dL (ref 30.0–36.0)
MCV: 101.1 fL — ABNORMAL HIGH (ref 80.0–100.0)
Platelets: 125 10*3/uL — ABNORMAL LOW (ref 150–400)
RBC: 2.64 MIL/uL — ABNORMAL LOW (ref 4.22–5.81)
RDW: 16.8 % — ABNORMAL HIGH (ref 11.5–15.5)
WBC: 3.7 10*3/uL — ABNORMAL LOW (ref 4.0–10.5)
nRBC: 0 % (ref 0.0–0.2)

## 2021-01-07 LAB — CULTURE, BLOOD (ROUTINE X 2): Special Requests: ADEQUATE

## 2021-01-07 LAB — SURGICAL PATHOLOGY

## 2021-01-07 LAB — HEPARIN LEVEL (UNFRACTIONATED)
Heparin Unfractionated: <0.1 IU/mL — ABNORMAL LOW (ref 0.30–0.70)
Heparin Unfractionated: <0.1 [IU]/mL — ABNORMAL LOW (ref 0.30–0.70)
Heparin Unfractionated: <0.1 IU/mL — ABNORMAL LOW (ref 0.30–0.70)

## 2021-01-07 IMAGING — DX DG ORTHOPANTOGRAM /PANORAMIC
1 series · 1 of 1 positions shown · non-contrast
Comparison: None.

CLINICAL DATA: Endocarditis

EXAM:
ORTHOPANTOGRAM/PANORAMIC

[view not recorded]
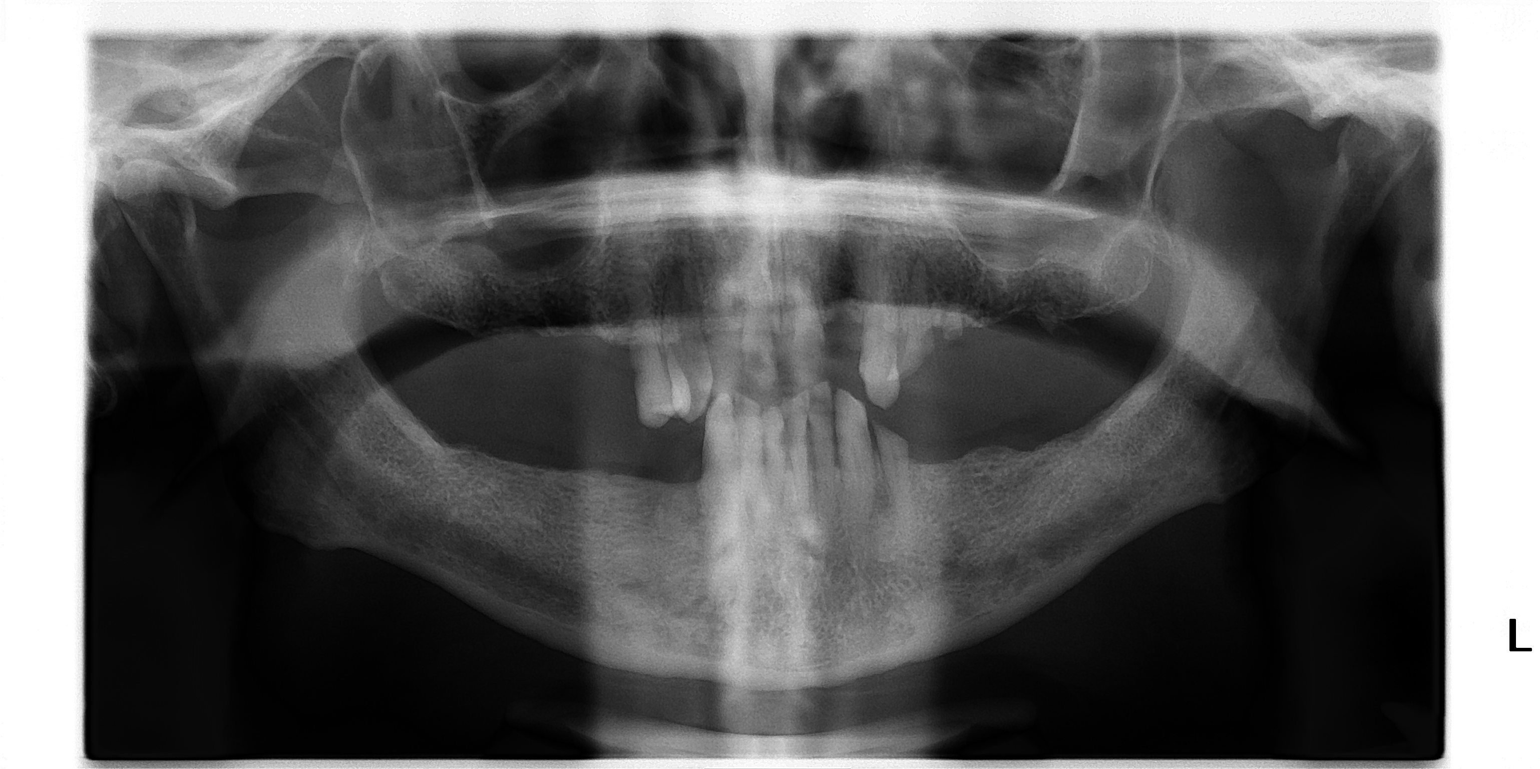

[1 of 1 positions shown; findings below may reference images not displayed]

FINDINGS: The mandible is intact. The maxilla is intact. There is numerous
absent dentition involving the maxillary and mandibular molars and
the majority of a maxillary and mandibular premolars.z dental caries
are identified within tooth 8, the residual fragment of tooth 13,
tooth 24, and tooth 25. A lucent lesion is seen within the maxilla
within the defect related to the absent tooth 10 demonstrating
relatively poorly circumscribed margins and no significant matrix
which may represent a residual periapical abscess.
IMPRESSION: Multiple dental caries within the residual dentition as described
above.

Lucency within the maxilla within the defect related to the absent
tooth 10 which may represent a a residual periapical abscess.

## 2021-01-07 MED ORDER — GENTAMICIN IN SALINE 1.6-0.9 MG/ML-% IV SOLN
80.0000 mg | Freq: Two times a day (BID) | INTRAVENOUS | Status: DC
  Administered 2021-01-07: 80 mg via INTRAVENOUS
  Filled 2021-01-07 (×2): qty 50

## 2021-01-07 MED ORDER — GENTAMICIN SULFATE 40 MG/ML IJ SOLN
200.0000 mg | INTRAVENOUS | Status: AC
  Administered 2021-01-07 – 2021-01-20 (×14): 200 mg via INTRAVENOUS
  Filled 2021-01-07 (×15): qty 5

## 2021-01-07 MED ORDER — PENICILLIN G POTASSIUM 20000000 UNITS IJ SOLR
12.0000 10*6.[IU] | Freq: Two times a day (BID) | INTRAVENOUS | Status: DC
  Administered 2021-01-07 – 2021-01-20 (×27): 12 10*6.[IU] via INTRAVENOUS
  Filled 2021-01-07: qty 12
  Filled 2021-01-07: qty 5
  Filled 2021-01-07 (×5): qty 12
  Filled 2021-01-07: qty 5
  Filled 2021-01-07 (×24): qty 12

## 2021-01-07 NOTE — Progress Notes (Signed)
Pharmacy Antibiotic Note  Donald Harrington is a 65 y.o. male admitted on 01/03/2021 with strokes  Pharmacy has been consulted for gentamicin dosing.  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 Will monitor trough levels, goal <1   Plan: Gentamicin 200mg  IV q24h Penicillin 12 million units q12h Continue to monitor Scr Trough level before 3/24 dose  Height: 5' 10.5" (179.1 cm) Weight: 74.8 kg (165 lb) IBW/kg (Calculated) : 74.15  Temp (24hrs), Avg:98.6 F (37 C), Min:98.1 F (36.7 C), Max:99.3 F (37.4 C)  Recent Labs  Lab 01/04/21 0322 01/05/21 0320 01/06/21 0410 01/07/21 0323  WBC 3.5* 4.1 3.3* 3.7*  CREATININE 1.29* 1.11 1.06 1.01    Estimated Creatinine Clearance: 75.5 mL/min (by C-G formula based on SCr of 1.01 mg/dL).    No Known Allergies  Antimicrobials this admission: Ceftriaxone 3/20 >>3/22 Penicillin 3/22>> Gentamicin 3/21>>  Microbiology results: 3/19 BCx: strep gordonii  Thank you for allowing pharmacy to be a part of this patient's care.  Phillis Haggis 01/07/2021 1:48 PM

## 2021-01-07 NOTE — Progress Notes (Signed)
Physical Therapy Treatment Patient Details Name: Donald Harrington MRN: [id number] DOB: [birth year] Today's Date: 01/07/2021    History of Present Illness 65 y.o. male presenting from outpatient MRI showing multifocal acute embolic strokes presents (subcentimeter bilateral cerebral and cerebellar infarcts). Also presenting with acute anemia and dark stools concerning for a GI bleed. Chest x-ray impression of COPD and bibasilar atelectasis or infiltrates, new since prior study. 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    PT Comments    The pt was seen for progression of OOB mobility and balance training this afternoon. The pt was able to demo good progress with transfers and improved ambulation tolerance at this time. The pt started mobilizing with use of RW, with minG but slowed gait and significant trunk flexion. However, the pt remained adamant that he would like to return to use of cane for ambulation, and therefore trialed HHA of 1 with ambulation. Pt with significant increase in lateral movement, drifting, and requiring minA to maintain stability with single UE support. The pt also progressively reached for additional UE support with fatigue during ambulation.   The pt is highly motivated to return to full independence, stated he would be "the hardest, most motivated worker" for therapy program with goal of facilitating return to independence. Pt voiced no further questions at this time about PT POC or goals. The pt does become defensive with possible loss of independence, but is highly motivated to participate and challenge himself with therapies when role of therapies for recovery is explained. Continue to recommend CIR level therapies at this time.    Follow Up Recommendations  CIR;Supervision/Assistance - 24 hour      Equipment Recommendations  None recommended by PT    Recommendations for Other Services       Precautions / Restrictions Precautions Precautions: Fall Precaution Comments: HOH; HR < 130 (return to bed if >130) Restrictions Weight Bearing Restrictions: No    Mobility  Bed Mobility Overal bed mobility: Modified Independent             General bed mobility comments: HOB elevated, pt able to complete without assist when IV disconnected    Transfers Overall transfer level: Needs assistance Equipment used: Rolling walker (2 wheeled);1 person hand held assist Transfers: Sit to/from Stand Sit to Stand: Min guard         General transfer comment: Min guard for safety. Pt able to use single UE on rail in bathroom, poor use of RW with initial stand, often holding with single UE only until cued  Ambulation/Gait Ambulation/Gait assistance: Min guard;Min assist Gait Distance (Feet): 250 Feet Assistive device: Rolling walker (2 wheeled);1 person hand held assist Gait Pattern/deviations: Step-through pattern;Decreased stride length;Staggering left;Staggering right;Drifts right/left Gait velocity: decreased   General Gait Details: Pt slow with trunk flexion with use of RW, pt needing minG only with use of RW, but pt adamant about returning to use of cane. MinA with HHA, pt intermittently reaching for other objects in environment with RUE becasue he usually uses cane in RUE      Modified Rankin (Stroke Patients Only) Modified Rankin (Stroke Patients Only) Pre-Morbid Rankin Score: No symptoms Modified Rankin: Moderately severe disability     Balance Overall balance assessment: Needs assistance Sitting-balance support: No upper extremity supported;Feet supported Sitting balance-Leahy Scale: Good     Standing balance support:  Bilateral upper extremity supported Standing balance-Leahy Scale: Poor Standing balance comment: at least single UE support for dynamic mobility,  moments of no UE support with static stance                            Cognition Arousal/Alertness: Awake/alert Behavior During Therapy: WFL for tasks assessed/performed (frustrated about situation) Overall Cognitive Status: Impaired/Different from baseline Area of Impairment: Memory;Problem solving                     Memory: Decreased recall of precautions;Decreased short-term memory       Problem Solving: Slow processing;Requires verbal cues General Comments: Pt presenting with tangential thoughts at times and quick emotional changes. He remains fixated on getting back to independence, agreeable to session with increased explanation of role of PT to facilitate safe return to independence. Few instances of word-finding difficulty during ambulation, descreased insight to safety as pt repeatedly asking to use cane despite increased instability with only SUE support         General Comments General comments (skin integrity, edema, etc.): VSS on RA      Pertinent Vitals/Pain Pain Assessment: Faces Pain Score: 0-No pain Faces Pain Scale: No hurt Pain Intervention(s): Monitored during session           PT Goals (current goals can now be found in the care plan section) Acute Rehab PT Goals Patient Stated Goal: to get back to independence with cane PT Goal Formulation: With patient Time For Goal Achievement: 01/19/21 Potential to Achieve Goals: Good Progress towards PT goals: Progressing toward goals    Frequency    Min 4X/week      PT Plan Current plan remains appropriate       AM-PAC PT "6 Clicks" Mobility   Outcome Measure  Help needed turning from your back to your side while in a flat bed without using bedrails?: None Help needed moving from lying on your back to sitting on the side of a flat bed without using bedrails?: None Help needed moving to and from a bed to a chair (including a wheelchair)?: A Little Help needed standing up from a  chair using your arms (e.g., wheelchair or bedside chair)?: A Little Help needed to walk in hospital room?: A Little Help needed climbing 3-5 steps with a railing? : A Lot 6 Click Score: 19    End of Session Equipment Utilized During Treatment: Gait belt Activity Tolerance: Patient tolerated treatment well Patient left: in bed;with call bell/phone within reach;with nursing/sitter in room Nurse Communication: Mobility status PT Visit Diagnosis: Unsteadiness on feet (R26.81);Other abnormalities of gait and mobility (R26.89);Muscle weakness (generalized) (M62.81);Difficulty in walking, not elsewhere classified (R26.2);Other symptoms and signs involving the nervous system (R29.898)     Time: 1550-1610 PT Time Calculation (min) (ACUTE ONLY): 20 min  Charges:  $Gait Training: 8-22 mins                     Karma Ganja, PT, DPT   Acute Rehabilitation Department Pager #: [phone]   Donald Harrington 01/07/2021, 4:39 PM

## 2021-01-07 NOTE — Progress Notes (Signed)
Attempted to see pt for CIR consult  Was discussing to go to CIR, pt needs to has someone that could help him when he leaves the hospital ,esp since he has had  multiple strokes- he got very upset when told he would not be able to drive for at least a little while-  He started to ask "who sent you? Are you here to make money off me?Are you from the government?"  I explained I was a physician from the hospital- and he said if he didn't get to go home, he "needed to be free".  He also said he wanted to leave hospital and became insistent- when I explained what he was being treated for, he got upset- he said he didn't want medical care- I explained it's obviously his choice if he wants medical care, but then he stated " I was trying to kill him" and insisted that I leave- I did attempt to see if he was still interested in coming to CIR (where, of note, we need the information I was trying to get from this exchange), however he just kept repeating I needed to leave him "alone" and leave and "never speak to him again".  If this behavior is recurring, it might be worth calling Psychiatry- it's not clear.   I appreciate pt is upset about wanting to be independent, however unfortunately, if he comes to CIR, with his medical/cognitive issues, he will need someone else to help at home with IV ABX or at least driving until he he able. Please let us know if pt is willing to accept help enough to come to CIR/and go home with some help until is independent again.   Thank you

## 2021-01-07 NOTE — Progress Notes (Signed)
ANTICOAGULATION CONSULT NOTE - Follow-Up Consult  Pharmacy Consult for Heparin Indication: atrial fibrillation and stroke  No Known Allergies  Patient Measurements: Height: 5' 10.5" (179.1 cm) Weight: 74.8 kg (165 lb) IBW/kg (Calculated) : 74.15 Heparin Dosing Weight: 74 kg  Vital Signs: Temp: 99.3 F (37.4 C) (03/22 0335) Temp Source: Oral (03/22 0335) BP: 118/71 (03/22 0335) Pulse Rate: 83 (03/22 0335)  Labs: Recent Labs    01/05/21 0320 01/06/21 0410 01/06/21 1842 01/07/21 0323  HGB 7.6* 8.2*  --  8.8*  HCT 23.2* 25.7*  --  26.7*  PLT 124* 120*  --  125*  LABPROT 17.3*  --   --   --   INR 1.5*  --   --   --   HEPARINUNFRC  --   --  <0.10* <0.10*  CREATININE 1.11 1.06  --  1.01    Estimated Creatinine Clearance: 75.5 mL/min (by C-G formula based on SCr of 1.01 mg/dL).  Assessment: 55 YOM on warfarin PTA for hx Afib, prior CVA who presented on 3/18 with new strokes on MRI, concern for GIB. The patient is s/p EGD on 3/20 which showed erosive gastropathy and non-bleeding duodenal ulcer. GI okayed restart of anticoagulation. Pharmacy consulted to start Heparin today for bridging - no bolus and low goal with CVA + GIB eval. The MD does not plan to resume Warfarin at this time as thinks the patient would likely benefit from transition to Apixaban.   Heparin level this morning remains SUBherapeutic and undetectable despite multiple rate increases. Checked L-AC IV site with RN - some puffiness noted, RN states not drawing back blood well. Switched Heparin to alternative IV site on R-arm. Will reduce the rate slightly to be cautious with switch of IV sites in case not infusing at higher rates.   CBC stable with Hgb 8's, plts 125 - will continue to monitor. No bleeding noted per discussion with RN.  Goal of Therapy:  Heparin level 0.3-0.5 units/ml Monitor platelets by anticoagulation protocol: Yes   Plan:  - Reduce Heparin to 1200 units/hr with switch of IV site - No  warfarin restart for now - MD evaluating transition to Apixaban - Will continue to monitor for any signs/symptoms of bleeding and will follow up with heparin level in 6 hours   Thank you for allowing pharmacy to be a part of this patient's care.  Alycia Rossetti, PharmD, BCPS Clinical Pharmacist Clinical phone for 01/07/2021: [phone] 01/07/2021 8:06 AM   **Pharmacist phone directory can now be found on [email or web address] ([email or web address]).  Listed under [place].

## 2021-01-07 NOTE — Progress Notes (Signed)
ANTICOAGULATION CONSULT NOTE - Follow Up Consult  Pharmacy Consult for heparin Indication: atrial fibrillation and stroke  Labs: Recent Labs    01/05/21 0320 01/06/21 0410 01/06/21 1842 01/07/21 0323 01/07/21 1125 01/07/21 2150  HGB 7.6* 8.2*  --  8.8*  --   --   HCT 23.2* 25.7*  --  26.7*  --   --   PLT 124* 120*  --  125*  --   --   LABPROT 17.3*  --   --   --   --   --   INR 1.5*  --   --   --   --   --   HEPARINUNFRC  --   --    < > <0.10* <0.10* <0.10*  CREATININE 1.11 1.06  --  1.01  --   --    < > = values in this interval not displayed.    Assessment: 65yo male subtherapeutic on heparin after changing IV site; no gtt issues or signs of bleeding per RN.  Goal of Therapy:  Heparin level 0.3-0.7 units/ml   Plan:  Will increase heparin gtt by 3 units/kg/hr to 1400 units/hr and check level in 6 hours.    Wynona Neat, PharmD, BCPS  01/07/2021,10:57 PM

## 2021-01-07 NOTE — Plan of Care (Signed)
  Problem: Education: Goal: Knowledge of secondary prevention will improve Outcome: Progressing Goal: Knowledge of patient specific risk factors addressed and post discharge goals established will improve Outcome: Progressing   

## 2021-01-07 NOTE — Progress Notes (Signed)
[name] for Infectious Disease  Date of Admission:  01/03/2021           Reason for visit: Follow up on strep gordonii bacteremia  Current antibiotics: Ceftriaxone: 3/20--present Gentamicin: 3/21--present  ASSESSMENT:    Strep gordonii bacteremia: Most likely from odontogenic source.  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  TTE without obvious vegetations, however, poor acoustic windows.  Awaiting TEE scheduled for tomorrow.  Orthopantogram with multiple dental caries within the residual dentition and possible residual periapical abscess. Acute embolic strokes: Concerning for septic emboli in the setting of bacteremia versus subtherapeutic INR with paroxysmal A. fib Paroxysmal A. Fib: on heparin gtt Acute blood loss anemia: Secondary to upper GI bleed/peptic ulcer disease.  Status post EGD 01/05/2021  PLAN:    Narrow to penicillin from ceftriaxone based on susceptibilities Continue gentamicin due to concern for prosthetic valve endocarditis Await TEE Recommend dentistry or oral surgery evaluation given orthopantogram findings Repeat blood cultures (ordered) Will follow   Principal Problem:   Acute blood loss anemia Active Problems:   Acute embolic stroke (HCC)   GI bleed   Cough   PAF (paroxysmal atrial fibrillation) (HCC)   History of aortic valve replacement   History of mitral valve replacement   Bacteremia due to Streptococcus   Duodenal ulcer   Acute gastric erosion    MEDICATIONS:    Scheduled Meds: . sodium chloride   Intravenous Once  . melatonin  5 mg Oral QHS  . metoprolol tartrate  25 mg Oral BID  . pantoprazole  40 mg Intravenous Q12H   Continuous Infusions: . sodium chloride 50 mL/hr at 01/07/21 0417  . gentamicin    . heparin 1,100 Units/hr (01/07/21 0417)  . penicillin g continuous IV infusion     PRN Meds:.acetaminophen **OR** acetaminophen, Muscle Rub, ondansetron **OR** ondansetron  (ZOFRAN) IV, traMADol  SUBJECTIVE:   24 hour events:  No acute events noted Afebrile WBC stable, platelets stable Creatinine stable   No new complaints.  Denies fevers or chills.  Awaiting TEE scheduled for tomorrow.  Tolerated antibiotics.  No current headache     OBJECTIVE:   Blood pressure 104/67, pulse 95, temperature 99.3 F (37.4 C), temperature source Oral, resp. rate 19, height 5' 10.5" (1.791 m), weight 74.8 kg, SpO2 98 %. Body mass index is 23.34 kg/m.  Physical Exam Constitutional:      General: He is not in acute distress.    Appearance: Normal appearance.  HENT:     Head: Normocephalic and atraumatic.     Mouth/Throat:     Comments: Dentition is poor Eyes:     Extraocular Movements: Extraocular movements intact.     Conjunctiva/sclera: Conjunctivae normal.  Pulmonary:     Effort: Pulmonary effort is normal. No respiratory distress.  Skin:    General: Skin is warm and dry.  Neurological:     General: No focal deficit present.     Mental Status: He is alert and oriented to person, place, and time.  Psychiatric:        Mood and Affect: Mood normal.        Behavior: Behavior normal.      Lab Results: Lab Results  Component Value Date   WBC 3.7 (L) 01/07/2021   HGB 8.8 (L) 01/07/2021   HCT 26.7 (L) 01/07/2021   MCV 101.1 (H) 01/07/2021   PLT 125 (L) 01/07/2021    Lab Results  Component Value Date  NA 139 01/07/2021   K 4.0 01/07/2021   CO2 23 01/07/2021   GLUCOSE 103 (H) 01/07/2021   BUN 12 01/07/2021   CREATININE 1.01 01/07/2021   CALCIUM 7.7 (L) 01/07/2021   GFRNONAA >60 01/07/2021    Lab Results  Component Value Date   ALT 11 01/07/2021   AST 12 (L) 01/07/2021   ALKPHOS 39 01/07/2021   BILITOT 0.4 01/07/2021    No results found for: CRP  No results found for: ESRSEDRATE   I have reviewed the micro and lab results in Epic.  Imaging: DG Orthopantogram  Result Date: 01/07/2021 CLINICAL DATA:  Endocarditis EXAM:  ORTHOPANTOGRAM/PANORAMIC COMPARISON:  None. FINDINGS: The mandible is intact. The maxilla is intact. There is numerous absent dentition involving the maxillary and mandibular molars and the majority of a maxillary and mandibular premolars.z dental caries are identified within tooth 8, the residual fragment of tooth 13, tooth 24, and tooth 25. A lucent lesion is seen within the maxilla within the defect related to the absent tooth 10 demonstrating relatively poorly circumscribed margins and no significant matrix which may represent a residual periapical abscess. IMPRESSION: Multiple dental caries within the residual dentition as described above. Lucency within the maxilla within the defect related to the absent tooth 10 which may represent a a residual periapical abscess. Electronically Signed   By: [name]   On: 01/07/2021 11:20   DG CHEST PORT 1 VIEW  Result Date: 01/05/2021 CLINICAL DATA:  Shortness of breath EXAM: PORTABLE CHEST 1 VIEW COMPARISON:  01/03/2021 FINDINGS: Prior median sternotomy and valve replacement. Heart is normal size. Hyperinflation/COPD. Bibasilar opacities. No effusions. No acute bony abnormality. IMPRESSION: COPD. Bibasilar atelectasis or infiltrates, new since prior study. Electronically Signed   By: Rolm Baptise M.D.   On: 01/05/2021 15:35   ECHOCARDIOGRAM COMPLETE  Result Date: 01/06/2021    ECHOCARDIOGRAM REPORT   Patient Name:   Donald Harrington Date of Exam: 01/05/2021 Medical Rec #:  [id number]    Height:       70.5 in Accession #:    [id number]   Weight:       165.0 lb Date of Birth:  1956/04/15    BSA:          1.934 m Patient Age:    65 years     BP:           109/68 mmHg Patient Gender: M            HR:           110 bpm. Exam Location:  Inpatient Procedure: 2D Echo Indications:    Stroke  History:        Patient has no prior history of Echocardiogram examinations.                 Aortic and mitral replacement in 2015 at [facility] due                 to  endocarditis; Aortic Valve Disease, Mitral Valve Disease and                 Endocarditis.  Sonographer:    Merrie Roof RDCS Referring Phys: [place]  1. Poor acoustic windows. Cannot exclude vegetations. Would recomm TEE to further evaluate if clinically indicated.  2. Left ventricular ejection fraction, by estimation, is 55 to 60%. The left ventricle has normal function. The left ventricle has no regional wall motion abnormalities. Indeterminate diastolic filling  due to E-A fusion.  3. Right ventricular systolic function is normal. The right ventricular size is normal.  4. Left atrial size was mild to moderately dilated.  5. MV prosthesis (Epic bioprosthesis, 05/01/14) Not well visualized. Peak and mean gradients through the valveare 19 and 9 mm Hx respectively. Compared to echo report from 06/05/19 [facility]) mild increase in mean gradient (6 to 9 mm). The mitral valve has been repaired/replaced. Trivial mitral valve regurgitation.  6. AV prosthesis (23 mm Hancock bioprosthesis, 05/01/14) present Not well visualized. Peak and mean gradients through the valve are 51 and 30 mm Hg respectively Comparee to reprot for 8/117/20 [facility]), mean graidnet is increased. . The aortic valve has been repaired/replaced. Aortic valve regurgitation is trivial.  7. The inferior vena cava is normal in size with greater than 50% respiratory variability, suggesting right atrial pressure of 3 mmHg. FINDINGS  Left Ventricle: Left ventricular ejection fraction, by estimation, is 55 to 60%. The left ventricle has normal function. The left ventricle has no regional wall motion abnormalities. The left ventricular internal cavity size was normal in size. There is  no left ventricular hypertrophy. Indeterminate diastolic filling due to E-A fusion. Right Ventricle: The right ventricular size is normal. Right vetricular wall thickness was not assessed. Right ventricular systolic function is normal. Left Atrium: Left atrial size  was mild to moderately dilated. Right Atrium: Right atrial size was normal in size. Pericardium: There is no evidence of pericardial effusion. Mitral Valve: MV prosthesis (Epic bioprosthesis, 05/01/14) Not well visualized peak and mean gradients through the valveare 19 and 9 mm Hx respectively. Compared to echo reprot for 06/05/19 [facility]) mild increase in mean gradient (6 to 9 mm). The mitral valve has been repaired/replaced. Trivial mitral valve regurgitation. MV peak gradient, 18.5 mmHg. The mean mitral valve gradient is 9.0 mmHg. Tricuspid Valve: The tricuspid valve is grossly normal. Tricuspid valve regurgitation is mild. Aortic Valve: AV prosthesis (23 mm Hancock bioprosthesis, 05/01/14) present Not well visualized. Peak and mean gradients through the valve are 51 and 30 mm Hg respectively Comparee to reprot for 8/117/20 [facility]), mean graidnet is increased. The aortic valve has been repaired/replaced. Aortic valve regurgitation is trivial. Aortic valve mean gradient measures 30.0 mmHg. Aortic valve peak gradient measures 50.7 mmHg. Aortic valve area, by VTI measures 0.43 cm. Pulmonic Valve: The pulmonic valve was not well visualized. Pulmonic valve regurgitation is not visualized. Aorta: The aortic root is normal in size and structure. Venous: The inferior vena cava is normal in size with greater than 50% respiratory variability, suggesting right atrial pressure of 3 mmHg. IAS/Shunts: The interatrial septum was not assessed.  LEFT VENTRICLE PLAX 2D LVIDd:         5.10 cm  Diastology LVIDs:         3.70 cm  LV e' lateral: 10.30 cm/s LV PW:         0.80 cm LV IVS:        1.00 cm LVOT diam:     2.00 cm LV SV:         33 LV SV Index:   17 LVOT Area:     3.14 cm  RIGHT VENTRICLE          IVC RV Basal diam:  3.80 cm  IVC diam: 1.90 cm LEFT ATRIUM             Index       RIGHT ATRIUM           Index LA  diam:        5.20 cm 2.69 cm/m  RA Area:     17.40 cm LA Vol (A2C):   64.0 ml 33.10 ml/m RA Volume:   45.50 ml  23.53  ml/m LA Vol (A4C):   95.3 ml 49.29 ml/m LA Biplane Vol: 82.6 ml 42.72 ml/m  AORTIC VALVE AV Area (Vmax):    0.67 cm AV Area (Vmean):   0.50 cm AV Area (VTI):     0.43 cm AV Vmax:           356.00 cm/s AV Vmean:          262.000 cm/s AV VTI:            0.752 m AV Peak Grad:      50.7 mmHg AV Mean Grad:      30.0 mmHg LVOT Vmax:         75.50 cm/s LVOT Vmean:        42.000 cm/s LVOT VTI:          0.104 m LVOT/AV VTI ratio: 0.14  AORTA Ao Root diam: 3.10 cm MITRAL VALVE             TRICUSPID VALVE MV Area VTI:  0.60 cm   TR Peak grad:   30.9 mmHg MV Peak grad: 18.5 mmHg  TR Vmax:        278.00 cm/s MV Mean grad: 9.0 mmHg MV Vmax:      2.15 m/s   SHUNTS MV Vmean:     139.0 cm/s Systemic VTI:  0.10 m                          Systemic Diam: 2.00 cm Dorris Carnes MD Electronically signed by Dorris Carnes MD Signature Date/Time: 01/06/2021/4:04:54 PM    Final      Imaging independently reviewed in Epic.    Raynelle Highland for Infectious Disease [facility] [phone] pager 01/07/2021, 11:33 AM

## 2021-01-07 NOTE — Anesthesia Postprocedure Evaluation (Signed)
Anesthesia Post Note  Patient: Donald Harrington  Procedure(s) Performed: ESOPHAGOGASTRODUODENOSCOPY (EGD) WITH PROPOFOL (N/A ) BIOPSY     Patient location during evaluation: PACU Anesthesia Type: MAC Level of consciousness: awake and alert Pain management: pain level controlled Vital Signs Assessment: post-procedure vital signs reviewed and stable Respiratory status: spontaneous breathing Cardiovascular status: stable Anesthetic complications: no   No complications documented.  Last Vitals:  Vitals:   01/07/21 0335 01/07/21 1026  BP: 118/71 104/67  Pulse: 83 95  Resp: 19   Temp: 37.4 C   SpO2: 98%     Last Pain:  Vitals:   01/07/21 1026  TempSrc:   PainSc: [place]

## 2021-01-07 NOTE — Progress Notes (Signed)
Donald Harrington for Heparin Indication: atrial fibrillation and stroke  No Known Allergies  Patient Measurements: Height: 5' 10.5" (179.1 cm) Weight: 74.8 kg (165 lb) IBW/kg (Calculated) : 74.15 Heparin Dosing Weight: 74 kg  Vital Signs: Temp: 99.3 F (37.4 C) (03/22 0335) Temp Source: Oral (03/22 0335) BP: 118/71 (03/22 0335) Pulse Rate: 83 (03/22 0335)  Labs: Recent Labs    01/05/21 0320 01/06/21 0410 01/06/21 1842 01/07/21 0323  HGB 7.6* 8.2*  --  8.8*  HCT 23.2* 25.7*  --  26.7*  PLT 124* 120*  --  125*  LABPROT 17.3*  --   --   --   INR 1.5*  --   --   --   HEPARINUNFRC  --   --  <0.10* <0.10*  CREATININE 1.11 1.06  --   --     Estimated Creatinine Clearance: 71.9 mL/min (by C-G formula based on SCr of 1.06 mg/dL).   Assessment: 76 YOM on warfarin PTA for hx Afib, prior CVA who presented on 3/18 with new strokes on MRI, concern for GIB. The patient is s/p EGD on 3/20 which showed erosive gastropathy and non-bleeding duodenal ulcer. GI okayed restart of anticoagulation. Pharmacy consulted to start Heparin today for bridging - no bolus and low goal with CVA + GIB eval.  The MD does not plan to resume Warfarin at this time as thinks the patient would likely benefit from transition to Apixaban.   Heparin level undetectable on gtt at 1100 units/hr. No issues with line or bleeding reported per RN.  Goal of Therapy:  Heparin level 0.3-0.5 units/ml Monitor platelets by anticoagulation protocol: Yes   Plan:  Increase IV heparin to 1300 units/hr F/u 6 hr heparin level  Sherlon Handing, PharmD, BCPS Please see amion for complete clinical pharmacist phone list  01/07/2021 4:10 AM

## 2021-01-07 NOTE — Consult Note (Incomplete)
Physical Medicine and Rehabilitation Consult Reason for Consult: Left arm numbness Referring Physician: Dr.Ghergie   HPI: Donald Harrington is a 65 y.o. right-handed male with history of alcohol use, atrial fibrillation maintained on Coumadin, CAD, bladder cancer, hypothyroidism, nonischemic cardiomyopathy with aortic valve replacement as well as mitral valve replacement 2015.  Per chart review patient lives alone.  1 level apartment.  Independent with assistive device driving short distances.  Presented 01/04/2020 [facility] with left arm numbness and headache.  INR on admission of 1.3.  MRI showed a subcentimeter bilateral cerebral and cerebellar infarct.  He was transferred to [facility] for further work-up.  MRA showed no large vessel occlusion.  Echocardiogram with ejection fraction of 55 to 60%. .  Patient currently remains on IV heparin with work-up ongoing.  Hospital course complicated by FOBT with anemia with gastroenterology consulted and underwent EGD 01/05/2021 showing erosive gastropathy duodenal ulcer with no evidence of bleeding..  Patient did receive a total of 2 units packed red blood cells with latest hemoglobin 8.8.  Patient subsequently found to have strep gordoni bacteremia started on ceftriaxone with follow-up per infectious disease with TEE pending.  Therapy evaluations completed with recommendations of physical medicine rehab consult.   Review of Systems  Constitutional: Negative for chills and fever.  HENT: Negative for hearing loss.   Eyes: Negative for blurred vision and double vision.  Respiratory: Negative for cough and shortness of breath.   Cardiovascular: Positive for palpitations. Negative for chest pain and leg swelling.  Gastrointestinal: Positive for constipation. Negative for heartburn, nausea and vomiting.  Genitourinary: Negative for dysuria, flank pain and hematuria.  Musculoskeletal: Positive for joint pain and myalgias.  Skin: Negative for  rash.  Neurological: Positive for sensory change, weakness and headaches.  All other systems reviewed and are negative.  Past Medical History:  Diagnosis Date  . Alcohol abuse   . Bladder cancer ([place])    remission s/p TURBT  . CAD (coronary artery disease), native coronary artery    MI in setting of endocarditis  . 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 Endocarditis    Mitral and Aortic valve 2015  . HTN (hypertension)   . Hypothyroidism   . NICM (nonischemic cardiomyopathy) ([place])    Normal EF as of 2020 echo  . PAF (paroxysmal atrial fibrillation) (HCC)    coumadin anticoagulation due to h/o valve replacements with bioprosthetic valves  . PUD (peptic ulcer disease)    causing GIB in 2015   Past Surgical History:  Procedure Laterality Date  . AORTIC VALVE REPLACEMENT  04/2014   bioprosthetic  . BIOPSY  01/05/2021   Procedure: BIOPSY;  Surgeon: Thornton Park, MD;  Location: [place];  Service: Gastroenterology;;  . ESOPHAGOGASTRODUODENOSCOPY (EGD) WITH PROPOFOL N/A 01/05/2021   Procedure: ESOPHAGOGASTRODUODENOSCOPY (EGD) WITH PROPOFOL;  Surgeon: Thornton Park, MD;  Location: [place];  Service: Gastroenterology;  Laterality: N/A;  . LAPAROSCOPIC APPENDECTOMY  2021  . 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 TRANSURETHRAL RESECTION OF BLADDER TUMOR     Family History  Problem Relation Age of Onset  . Thyroid disease Sister    Social History:  reports that he has been smoking. He has a 40.00 pack-year smoking history. He has never used smokeless tobacco. He reports previous alcohol use. He reports that he does not use drugs. Allergies: No Known Allergies Medications Prior to Admission  Medication Sig Dispense Refill  . cyclobenzaprine (FLEXERIL) 10 MG  tablet Take 10 mg by mouth daily as needed.    Arna Medici 25 MCG tablet Take 25 mcg by mouth daily.    . furosemide (LASIX) 20 MG tablet Take 40 mg by mouth daily.    Marland Kitchen lovastatin (MEVACOR)  20 MG tablet Take 20 mg by mouth at bedtime.    . metoprolol succinate (TOPROL-XL) 50 MG 24 hr tablet Take 50 mg by mouth daily.    . potassium chloride (KLOR-CON) 10 MEQ tablet Take 10 mEq by mouth daily.    Marland Kitchen thiamine 250 MG tablet Take 250 mg by mouth daily.    Marland Kitchen warfarin (COUMADIN) 5 MG tablet Take 2.5 mg by mouth daily.    . Aspirin-Acetaminophen-Caffeine (GOODYS EXTRA STRENGTH) [phone] MG PACK Take 1 packet by mouth daily as needed (pain).      Home: Home Living Family/patient expects to be discharged to:: Private residence Living Arrangements: Alone Available Help at Discharge:  (no friends or family nearby) Type of Home: Apartment Home Access: Level entry Home Layout: One level Bathroom Shower/Tub: Multimedia programmer: [place]: [place] - single point,Walker - 2 wheels Additional Comments: Reports apartment is handicap accessible.  Lives With: Alone  Functional History: Prior Function Level of Independence: Independent with assistive device(s) Comments: ADLs, IADLs, and driving short distance "around my town." Use of [facility] for functional mobility Functional Status:  Mobility: Bed Mobility Overal bed mobility: Modified Independent General bed mobility comments: Pt performing bed mobility with increased time. Educating pt on log roll technique for reducing neck pain but pt prefering his own method Transfers Overall transfer level: Needs assistance Equipment used: Rolling walker (2 wheeled) Transfers: Sit to/from Stand Sit to Stand: Min guard General transfer comment: Min guard for safety. Pt with poor RW management nad pulling up on RW. No difficulty however Ambulation/Gait Ambulation/Gait assistance: Min guard,Min assist Gait Distance (Feet): 105 Feet Assistive device: Rolling walker (2 wheeled) Gait Pattern/deviations: Decreased step length - right,Decreased stride length,Shuffle,Step-through pattern,Decreased step length - left General Gait  Details: Pt ambulates slowly with mild instability with RW (improved from using [facility] Pt continues to demonstrate poor bil step length, shuffling at times, and poor management of RW, needing cues to stay proximal to it, especially during turns. Min guard-A for steadying and safety. Gait velocity: reduced Gait velocity interpretation: <1.8 ft/sec, indicate of risk for recurrent falls    ADL: ADL Overall ADL's : Needs assistance/impaired Eating/Feeding: Set up,Supervision/ safety,Sitting Grooming: Min guard,Standing Upper Body Bathing: Min guard,Sitting Lower Body Bathing: Min guard,Sit to/from stand Upper Body Dressing : Min guard,Sitting Lower Body Dressing: Min guard,Sit to/from stand Lower Body Dressing Details (indicate cue type and reason): Min Guard A for safety. Pt able to bend forward and pull up L sock with RUE Toilet Transfer: Min guard,Ambulation,RW (simulated in room) Functional mobility during ADLs: Min guard,Rolling walker General ADL Comments: Pt presenting with decreased balance, strength, and functional use of LUE.  Cognition: Cognition Overall Cognitive Status: Impaired/Different from baseline Arousal/Alertness: Awake/alert Orientation Level: Oriented X4 Attention: Sustained Sustained Attention: Appears intact Memory:  (difficult to assess) Behaviors: Poor frustration tolerance Safety/Judgment: Appears intact Cognition Arousal/Alertness: Awake/alert Behavior During Therapy: WFL for tasks assessed/performed (Pt frustrated about situation) Overall Cognitive Status: Impaired/Different from baseline Area of Impairment: Memory,Problem solving Memory: Decreased short-term memory Problem Solving: Slow processing,Requires verbal cues General Comments: Pt presenting with tangiental thoughts and quick emotional changes. Benefits from calm, simple cues. Pt requiring increased time for processing  Blood pressure 104/65, pulse 72, temperature 98.1  F (36.7 C), temperature  source Oral, resp. rate 15, height 5' 10.5" (1.791 m), weight 74.8 kg, SpO2 95 %. Physical Exam Neurological:     Comments: Patient is alert in no acute distress.  Makes eye contact with examiner.  Provides name and age.  Follows simple commands.     Results for orders placed or performed during the hospital encounter of 01/03/21 (from the past 24 hour(s))  Heparin level (unfractionated)     Status: Abnormal   Collection Time: 01/06/21  6:42 PM  Result Value Ref Range   Heparin Unfractionated <0.10 (L) 0.30 - 0.70 IU/mL  Comprehensive metabolic panel     Status: Abnormal   Collection Time: 01/07/21  3:23 AM  Result Value Ref Range   Sodium 139 135 - 145 mmol/L   Potassium 4.0 3.5 - 5.1 mmol/L   Chloride 109 98 - 111 mmol/L   CO2 23 22 - 32 mmol/L   Glucose, Bld 103 (H) 70 - 99 mg/dL   BUN 12 8 - 23 mg/dL   Creatinine, Ser 1.01 0.61 - 1.24 mg/dL   Calcium 7.7 (L) 8.9 - 10.3 mg/dL   Total Protein 4.8 (L) 6.5 - 8.1 g/dL   Albumin 2.0 (L) 3.5 - 5.0 g/dL   AST 12 (L) 15 - 41 U/L   ALT 11 0 - 44 U/L   Alkaline Phosphatase 39 38 - 126 U/L   Total Bilirubin 0.4 0.3 - 1.2 mg/dL   GFR, Estimated >60 >60 mL/min   Anion gap 7 5 - 15  CBC     Status: Abnormal   Collection Time: 01/07/21  3:23 AM  Result Value Ref Range   WBC 3.7 (L) 4.0 - 10.5 K/uL   RBC 2.64 (L) 4.22 - 5.81 MIL/uL   Hemoglobin 8.8 (L) 13.0 - 17.0 g/dL   HCT 26.7 (L) 39.0 - 52.0 %   MCV 101.1 (H) 80.0 - 100.0 fL   MCH 33.3 26.0 - 34.0 pg   MCHC 33.0 30.0 - 36.0 g/dL   RDW 16.8 (H) 11.5 - 15.5 %   Platelets 125 (L) 150 - 400 K/uL   nRBC 0.0 0.0 - 0.2 %  Heparin level (unfractionated)     Status: Abnormal   Collection Time: 01/07/21  3:23 AM  Result Value Ref Range   Heparin Unfractionated <0.10 (L) 0.30 - 0.70 IU/mL  Heparin level (unfractionated)     Status: Abnormal   Collection Time: 01/07/21 11:25 AM  Result Value Ref Range   Heparin Unfractionated <0.10 (L) 0.30 - 0.70 IU/mL   DG Orthopantogram  Result  Date: 01/07/2021 CLINICAL DATA:  Endocarditis EXAM: ORTHOPANTOGRAM/PANORAMIC COMPARISON:  None. FINDINGS: The mandible is intact. The maxilla is intact. There is numerous absent dentition involving the maxillary and mandibular molars and the majority of a maxillary and mandibular premolars.z dental caries are identified within tooth 8, the residual fragment of tooth 13, tooth 24, and tooth 25. A lucent lesion is seen within the maxilla within the defect related to the absent tooth 10 demonstrating relatively poorly circumscribed margins and no significant matrix which may represent a residual periapical abscess. IMPRESSION: Multiple dental caries within the residual dentition as described above. Lucency within the maxilla within the defect related to the absent tooth 10 which may represent a a residual periapical abscess. Electronically Signed   By: [name]   On: 01/07/2021 11:20   DG CHEST PORT 1 VIEW  Result Date: 01/05/2021 CLINICAL DATA:  Shortness of breath EXAM:  PORTABLE CHEST 1 VIEW COMPARISON:  01/03/2021 FINDINGS: Prior median sternotomy and valve replacement. Heart is normal size. Hyperinflation/COPD. Bibasilar opacities. No effusions. No acute bony abnormality. IMPRESSION: COPD. Bibasilar atelectasis or infiltrates, new since prior study. Electronically Signed   By: Rolm Baptise M.D.   On: 01/05/2021 15:35   ECHOCARDIOGRAM COMPLETE  Result Date: 01/06/2021    ECHOCARDIOGRAM REPORT   Patient Name:   ALYUS Hlavacek Date of Exam: 01/05/2021 Medical Rec #:  [id number]    Height:       70.5 in Accession #:    [id number]   Weight:       165.0 lb Date of Birth:  08/21/1956    BSA:          1.934 m Patient Age:    89 years     BP:           109/68 mmHg Patient Gender: M            HR:           110 bpm. Exam Location:  Inpatient Procedure: 2D Echo Indications:    Stroke  History:        Patient has no prior history of Echocardiogram examinations.                 Aortic and mitral replacement in 2015  at [facility] due                 to endocarditis; Aortic Valve Disease, Mitral Valve Disease and                 Endocarditis.  Sonographer:    Merrie Roof RDCS Referring Phys: [place]  1. Poor acoustic windows. Cannot exclude vegetations. Would recomm TEE to further evaluate if clinically indicated.  2. Left ventricular ejection fraction, by estimation, is 55 to 60%. The left ventricle has normal function. The left ventricle has no regional wall motion abnormalities. Indeterminate diastolic filling due to E-A fusion.  3. Right ventricular systolic function is normal. The right ventricular size is normal.  4. Left atrial size was mild to moderately dilated.  5. MV prosthesis (Epic bioprosthesis, 05/01/14) Not well visualized. Peak and mean gradients through the valveare 19 and 9 mm Hx respectively. Compared to echo report from 06/05/19 [facility]) mild increase in mean gradient (6 to 9 mm). The mitral valve has been repaired/replaced. Trivial mitral valve regurgitation.  6. AV prosthesis (23 mm Hancock bioprosthesis, 05/01/14) present Not well visualized. Peak and mean gradients through the valve are 51 and 30 mm Hg respectively Comparee to reprot for 8/117/20 [facility]), mean graidnet is increased. . The aortic valve has been repaired/replaced. Aortic valve regurgitation is trivial.  7. The inferior vena cava is normal in size with greater than 50% respiratory variability, suggesting right atrial pressure of 3 mmHg. FINDINGS  Left Ventricle: Left ventricular ejection fraction, by estimation, is 55 to 60%. The left ventricle has normal function. The left ventricle has no regional wall motion abnormalities. The left ventricular internal cavity size was normal in size. There is  no left ventricular hypertrophy. Indeterminate diastolic filling due to E-A fusion. Right Ventricle: The right ventricular size is normal. Right vetricular wall thickness was not assessed. Right ventricular systolic  function is normal. Left Atrium: Left atrial size was mild to moderately dilated. Right Atrium: Right atrial size was normal in size. Pericardium: There is no evidence of pericardial effusion. Mitral Valve: MV prosthesis (Epic bioprosthesis, 05/01/14)  Not well visualized peak and mean gradients through the valveare 19 and 9 mm Hx respectively. Compared to echo reprot for 06/05/19 [facility]) mild increase in mean gradient (6 to 9 mm). The mitral valve has been repaired/replaced. Trivial mitral valve regurgitation. MV peak gradient, 18.5 mmHg. The mean mitral valve gradient is 9.0 mmHg. Tricuspid Valve: The tricuspid valve is grossly normal. Tricuspid valve regurgitation is mild. Aortic Valve: AV prosthesis (23 mm Hancock bioprosthesis, 05/01/14) present Not well visualized. Peak and mean gradients through the valve are 51 and 30 mm Hg respectively Comparee to reprot for 8/117/20 [facility]), mean graidnet is increased. The aortic valve has been repaired/replaced. Aortic valve regurgitation is trivial. Aortic valve mean gradient measures 30.0 mmHg. Aortic valve peak gradient measures 50.7 mmHg. Aortic valve area, by VTI measures 0.43 cm. Pulmonic Valve: The pulmonic valve was not well visualized. Pulmonic valve regurgitation is not visualized. Aorta: The aortic root is normal in size and structure. Venous: The inferior vena cava is normal in size with greater than 50% respiratory variability, suggesting right atrial pressure of 3 mmHg. IAS/Shunts: The interatrial septum was not assessed.  LEFT VENTRICLE PLAX 2D LVIDd:         5.10 cm  Diastology LVIDs:         3.70 cm  LV e' lateral: 10.30 cm/s LV PW:         0.80 cm LV IVS:        1.00 cm LVOT diam:     2.00 cm LV SV:         33 LV SV Index:   17 LVOT Area:     3.14 cm  RIGHT VENTRICLE          IVC RV Basal diam:  3.80 cm  IVC diam: 1.90 cm LEFT ATRIUM             Index       RIGHT ATRIUM           Index LA diam:        5.20 cm 2.69 cm/m  RA Area:     17.40 cm LA Vol (A2C):    64.0 ml 33.10 ml/m RA Volume:   45.50 ml  23.53 ml/m LA Vol (A4C):   95.3 ml 49.29 ml/m LA Biplane Vol: 82.6 ml 42.72 ml/m  AORTIC VALVE AV Area (Vmax):    0.67 cm AV Area (Vmean):   0.50 cm AV Area (VTI):     0.43 cm AV Vmax:           356.00 cm/s AV Vmean:          262.000 cm/s AV VTI:            0.752 m AV Peak Grad:      50.7 mmHg AV Mean Grad:      30.0 mmHg LVOT Vmax:         75.50 cm/s LVOT Vmean:        42.000 cm/s LVOT VTI:          0.104 m LVOT/AV VTI ratio: 0.14  AORTA Ao Root diam: 3.10 cm MITRAL VALVE             TRICUSPID VALVE MV Area VTI:  0.60 cm   TR Peak grad:   30.9 mmHg MV Peak grad: 18.5 mmHg  TR Vmax:        278.00 cm/s MV Mean grad: 9.0 mmHg MV Vmax:      2.15 m/s   SHUNTS MV  Vmean:     139.0 cm/s Systemic VTI:  0.10 m                          Systemic Diam: 2.00 cm Dorris Carnes MD Electronically signed by Dorris Carnes MD Signature Date/Time: 01/06/2021/4:04:54 PM    Final     ***  Lavon Paganini Angiulli, PA-C 01/07/2021

## 2021-01-07 NOTE — Progress Notes (Addendum)
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 fib on Coumadin, history of peptic ulcer disease causing GI bleed in 2015 comes into the hospital after an outpatient MRI showed multifocal acute embolic strokes in various parts of the brain.  He is quite tangential on my interview but tells me that he has been also taking Goody's powder for neck pain (cervical disc disease), also reported some dark stools recently.  No chest pain, no abdominal pain, no nausea or vomiting.  Shortly after admission patient developed fever, chills, tachycardia and blood cultures were sent and eventually speciated Streptococcus.  ID was consulted.  Subjective / 24h Interval events: Complains of a headache and neck pain this morning.  These have been chronic  Assessment & Plan: Principal Problem Acute blood loss anemia, upper GI bleed, PUD-patient reports using Goody's powder for his neck pain, also dark stools.  Continue Protonix.  GI consulted and patient underwent an EGD on 3/20 which showed erosive gastropathy, duodenal ulcer but no evidence of bleeding.  Has been placed on PPI.  Received a total of 2 units of packed red blood cells, hemoglobin has improved.  No further bleeding clinically.  Okay to resume anticoagulation per GI, started Heparin 3/21, hemoglobin has remained stable today and he has no further bleeding.  Once closer to discharge probably best to be transitioned to a NOAC rather than Coumadin given noncompliance  Active Problems Acute multifocal embolic strokes-this is likely in the setting of subtherapeutic INR and history of A. fib versus septic emboli/endocarditis given bacteremia.  He reports that he himself cut down his Coumadin dose and  he was eating more broccoli, difficult to get a straight story but tells me he did this because of taking Goody's powder.  Continue heparin for now  Hypotension-on 3/20, in the setting of GI bleed, sepsis, stabilized today  Sepsis due to Streptococcus bacteremia, concern for endocarditis-patient with sepsis physiology 3/20, hypotension, fever, sinus tachycardia.  He has a history of endocarditis with Enterococcus in 2015, had CVA at that time likely due to that.  Blood cultures speciated Streptococcus gordonii.  A 2D echo did not show any vegetations, I have contacted cardiology this morning to request a TEE, he is on schedule for Wednesday 3/23 at 11:00 AM  Paroxysmal A. fib-currently in sinus rhythm.  Heparin as above  Essential hypertension-hypotensive, hold medications  Cough-patient reports this is chronic   Scheduled Meds: . sodium chloride   Intravenous Once  . melatonin  5 mg Oral QHS  . metoprolol tartrate  25 mg Oral BID  . pantoprazole  40 mg Intravenous Q12H   Continuous Infusions: . sodium chloride 50 mL/hr at 01/07/21 0417  . cefTRIAXone (ROCEPHIN)  IV Stopped (01/06/21 2323)  . gentamicin Stopped (01/06/21 1618)  . heparin 1,100 Units/hr (01/07/21 0417)   PRN Meds:.acetaminophen **OR** acetaminophen, Muscle Rub, ondansetron **OR** ondansetron (ZOFRAN) IV, traMADol  Diet Orders (From admission, onward)    Start     Ordered   01/05/21 1146  Diet regular Room service appropriate? Yes with Assist; Fluid consistency: Thin  Diet effective now       Question Answer Comment  Room service appropriate? Yes with Assist   Fluid consistency: Thin      01/05/21 1146  DVT prophylaxis: SCDs Start: 01/03/21 2051     Code Status: Full Code  Family Communication: No family at bedside  Status is: Inpatient  Remains inpatient appropriate because:Hemodynamically unstable and Inpatient level of care appropriate due to severity of illness   Dispo: The patient is  from: Home              Anticipated d/c is to: Home              Patient currently is not medically stable to d/c.   Difficult to place patient No   Level of care: Progressive  Consultants:  Neurology Gastroenterology Infectious disease  Procedures:  2D echo IMPRESSIONS  1. Poor acoustic windows. Cannot exclude vegetations. Would recomm TEE to further evaluate if clinically indicated.  2. Left ventricular ejection fraction, by estimation, is 55 to 60%. The left ventricle has normal function. The left ventricle has no regional wall motion abnormalities. Indeterminate diastolic filling due to E-A fusion.  3. Right ventricular systolic function is normal. The right ventricular size is normal.  4. Left atrial size was mild to moderately dilated.  5. MV prosthesis (Epic bioprosthesis, 05/01/14) Not well visualized. Peak and mean gradients through the valveare 19 and 9 mm Hx respectively. Compared to echo report from 06/05/19 [facility]) mild increase in mean gradient (6 to 9 mm). The mitral valve has been repaired/replaced. Trivial mitral valve regurgitation.  6. AV prosthesis (23 mm Hancock bioprosthesis, 05/01/14) present Not well visualized. Peak and mean gradients through the valve are 51 and 30 mm Hg respectively Comparee to reprot for 8/117/20 [facility]), mean graidnet is increased. . The aortic valve has been repaired/replaced. Aortic valve regurgitation is trivial.  7. The inferior vena cava is normal in size with greater than 50% respiratory variability, suggesting right atrial pressure of 3 mmHg.   Microbiology  Blood cultures   Antimicrobials: none    Objective: Vitals:   01/06/21 1929 01/06/21 2213 01/06/21 2313 01/07/21 0335  BP: 118/82 105/64 (!) 102/59 118/71  Pulse:   76 83  Resp: 18  16 19   Temp: 98.5 F (36.9 C)  98.4 F (36.9 C) 99.3 F (37.4 C)  TempSrc: Axillary  Oral Oral  SpO2: 94%  95% 98%  Weight:      Height:        Intake/Output Summary (Last 24 hours)  at 01/07/2021 0902 Last data filed at 01/07/2021 0417 Gross per 24 hour  Intake 1368.95 ml  Output 650 ml  Net 718.95 ml   Filed Weights   01/05/21 0309 01/05/21 0912  Weight: 78.9 kg 74.8 kg    Examination:  Constitutional: No distress, in bed Eyes: No scleral icterus ENMT: mmm Neck: normal, supple Respiratory: Clear bilaterally, no wheezing, no crackles Cardiovascular: Regular rate and rhythm, no murmurs, no peripheral edema Abdomen: Soft, nontender, nondistended, positive bowel sounds Musculoskeletal: no clubbing / cyanosis.  Skin: No rashes appreciated Neurologic: No focal deficits.  Equal strength.   Data Reviewed: I have independently reviewed following labs and imaging studies   CBC: Recent Labs  Lab 01/04/21 0322 01/04/21 1511 01/05/21 0320 01/06/21 0410 01/07/21 0323  WBC 3.5*  --  4.1 3.3* 3.7*  HGB 7.0* 7.8* 7.6* 8.2* 8.8*  HCT 21.4* 24.0* 23.2* 25.7* 26.7*  MCV 105.9*  --  102.2* 102.0* 101.1*  PLT 125*  --  124* 120* 716*   Basic Metabolic Panel: Recent Labs  Lab 01/04/21 0322 01/05/21 0320 01/06/21 0410 01/07/21 0323  NA 137 137 135 139  K 4.5 3.6 3.6 4.0  CL 103 105 105 109  CO2 24 21* 23 23  GLUCOSE 97 90 96 103*  BUN 15 15 15 12   CREATININE 1.29* 1.11 1.06 1.01  CALCIUM 8.1* 7.8* 7.7* 7.7*  MG  --  2.0  --   --   PHOS  --  3.0  --   --    Liver Function Tests: Recent Labs  Lab 01/04/21 0322 01/05/21 0320 01/07/21 0323  AST 17 14* 12*  ALT 17 14 11   ALKPHOS 42 38 39  BILITOT 1.2 0.9 0.4  PROT 5.2* 4.9* 4.8*  ALBUMIN 2.4* 2.3* 2.0*   Coagulation Profile: Recent Labs  Lab 01/05/21 0320  INR 1.5*   HbA1C: No results for input(s): HGBA1C in the last 72 hours. CBG: No results for input(s): GLUCAP in the last 168 hours.  Recent Results (from the past 240 hour(s))  Culture, blood (Routine X 2) w Reflex to ID Panel     Status: Abnormal   Collection Time: 01/04/21  3:04 PM   Specimen: BLOOD LEFT HAND  Result Value Ref Range  Status   Specimen Description BLOOD LEFT HAND  Final   Special Requests   Final    BOTTLES DRAWN AEROBIC AND ANAEROBIC Blood Culture results may not be optimal due to an inadequate volume of blood received in culture bottles   Culture  Setup Time   Final    GRAM POSITIVE COCCI IN CHAINS IN BOTH AEROBIC AND ANAEROBIC BOTTLES CRITICAL RESULT CALLED TO, READ BACK BY AND VERIFIED WITH: J,MILLEN PHARMD @0851  01/05/21 EB Performed at [facility], [place] [address], [place], [place] [address]    Culture STREPTOCOCCUS GORDONII (A)  Final   Report Status 01/07/2021 FINAL  Final   Organism ID, Bacteria STREPTOCOCCUS GORDONII  Final      Susceptibility   Streptococcus gordonii - MIC*    PENICILLIN <=0.06 SENSITIVE Sensitive     CEFTRIAXONE <=0.12 SENSITIVE Sensitive     ERYTHROMYCIN 2 RESISTANT Resistant     LEVOFLOXACIN 1 SENSITIVE Sensitive     VANCOMYCIN 0.5 SENSITIVE Sensitive     * STREPTOCOCCUS GORDONII  Blood Culture ID Panel (Reflexed)     Status: Abnormal   Collection Time: 01/04/21  3:04 PM  Result Value Ref Range Status   Enterococcus faecalis NOT DETECTED NOT DETECTED Final   Enterococcus Faecium NOT DETECTED NOT DETECTED Final   Listeria monocytogenes NOT DETECTED NOT DETECTED Final   Staphylococcus species NOT DETECTED NOT DETECTED Final   Staphylococcus aureus (BCID) NOT DETECTED NOT DETECTED Final   Staphylococcus epidermidis NOT DETECTED NOT DETECTED Final   Staphylococcus lugdunensis NOT DETECTED NOT DETECTED Final   Streptococcus species DETECTED (A) NOT DETECTED Final    Comment: Not Enterococcus species, Streptococcus agalactiae, Streptococcus pyogenes, or Streptococcus pneumoniae. CRITICAL RESULT CALLED TO, READ BACK BY AND VERIFIED WITH: J,MILLEN PHARMD @0851  01/05/21 EB    Streptococcus agalactiae NOT DETECTED NOT DETECTED Final   Streptococcus pneumoniae NOT DETECTED NOT DETECTED Final   Streptococcus pyogenes NOT DETECTED NOT DETECTED Final    A.calcoaceticus-baumannii NOT DETECTED NOT DETECTED Final   Bacteroides fragilis NOT DETECTED NOT DETECTED Final   Enterobacterales NOT DETECTED NOT DETECTED Final   Enterobacter cloacae complex NOT DETECTED NOT DETECTED Final   Escherichia coli NOT DETECTED NOT DETECTED Final   Klebsiella aerogenes NOT DETECTED NOT DETECTED Final   Klebsiella oxytoca NOT DETECTED NOT DETECTED Final   Klebsiella pneumoniae NOT DETECTED NOT DETECTED Final  Proteus species NOT DETECTED NOT DETECTED Final   Salmonella species NOT DETECTED NOT DETECTED Final   Serratia marcescens NOT DETECTED NOT DETECTED Final   Haemophilus influenzae NOT DETECTED NOT DETECTED Final   Neisseria meningitidis NOT DETECTED NOT DETECTED Final   Pseudomonas aeruginosa NOT DETECTED NOT DETECTED Final   Stenotrophomonas maltophilia NOT DETECTED NOT DETECTED Final   Candida albicans NOT DETECTED NOT DETECTED Final   Candida auris NOT DETECTED NOT DETECTED Final   Candida glabrata NOT DETECTED NOT DETECTED Final   Candida krusei NOT DETECTED NOT DETECTED Final   Candida parapsilosis NOT DETECTED NOT DETECTED Final   Candida tropicalis NOT DETECTED NOT DETECTED Final   Cryptococcus neoformans/gattii NOT DETECTED NOT DETECTED Final    Comment: Performed at [facility], [address], [place], [place] [address]  Culture, blood (Routine X 2) w Reflex to ID Panel     Status: Abnormal   Collection Time: 01/04/21  3:11 PM   Specimen: BLOOD LEFT HAND  Result Value Ref Range Status   Specimen Description BLOOD LEFT HAND  Final   Special Requests   Final    BOTTLES DRAWN AEROBIC ONLY Blood Culture adequate volume   Culture  Setup Time   Final    GRAM POSITIVE COCCI IN CHAINS AEROBIC BOTTLE ONLY CRITICAL VALUE NOTED.  VALUE IS CONSISTENT WITH PREVIOUSLY REPORTED AND CALLED VALUE.    Culture (A)  Final    STREPTOCOCCUS GORDONII SUSCEPTIBILITIES PERFORMED ON PREVIOUS CULTURE WITHIN THE LAST 5 DAYS. Performed at [facility], [place] [address] [place], [place] [address]    Report Status 01/07/2021 FINAL  Final  Resp Panel by RT-PCR (Flu A&B, Covid) Nasopharyngeal Swab     Status: None   Collection Time: 01/05/21  6:31 AM   Specimen: Nasopharyngeal Swab; Nasopharyngeal(NP) swabs in vial transport medium  Result Value Ref Range Status   SARS Coronavirus 2 by RT PCR NEGATIVE NEGATIVE Final    Comment: (NOTE) SARS-CoV-2 target nucleic acids are NOT DETECTED.  The SARS-CoV-2 RNA is generally detectable in upper respiratory specimens during the acute phase of infection. The lowest concentration of SARS-CoV-2 viral copies this assay can detect is 138 copies/mL. A negative result does not preclude SARS-Cov-2 infection and should not be used as the sole basis for treatment or other patient management decisions. A negative result may occur with  improper specimen collection/handling, submission of specimen other than nasopharyngeal swab, presence of viral mutation(s) within the areas targeted by this assay, and inadequate number of viral copies(<138 copies/mL). A negative result must be combined with clinical observations, patient history, and epidemiological information. The expected result is Negative.  Fact Sheet for Patients:  EntrepreneurPulse.com.au  Fact Sheet for Healthcare Providers:  IncredibleEmployment.be  This test is no t yet approved or cleared by the [place] FDA and  has been authorized for detection and/or diagnosis of SARS-CoV-2 by FDA under an Emergency Use Authorization (EUA). This EUA will remain  in effect (meaning this test can be used) for the duration of the COVID-19 declaration under Section 564(b)(1) of the Act, 21 U.S.C.section 360bbb-3(b)(1), unless the authorization is terminated  or revoked sooner.       Influenza A by PCR NEGATIVE NEGATIVE Final   Influenza B by PCR NEGATIVE NEGATIVE Final    Comment: (NOTE) The Xpert Xpress  SARS-CoV-2/FLU/RSV plus assay is intended as an aid in the diagnosis of influenza from Nasopharyngeal swab specimens and should not be used as a sole basis  for treatment. Nasal washings and aspirates are unacceptable for Xpert Xpress SARS-CoV-2/FLU/RSV testing.  Fact Sheet for Patients: EntrepreneurPulse.com.au  Fact Sheet for Healthcare Providers: IncredibleEmployment.be  This test is not yet approved or cleared by the [place] FDA and has been authorized for detection and/or diagnosis of SARS-CoV-2 by FDA under an Emergency Use Authorization (EUA). This EUA will remain in effect (meaning this test can be used) for the duration of the COVID-19 declaration under Section 564(b)(1) of the Act, 21 U.S.C. section 360bbb-3(b)(1), unless the authorization is terminated or revoked.  Performed at [facility], [place] [address], [place], [place] [address]      Radiology Studies: ECHOCARDIOGRAM COMPLETE  Result Date: 01/06/2021    ECHOCARDIOGRAM REPORT   Patient Name:   Donald Harrington Date of Exam: 01/05/2021 Medical Rec #:  [id number]    Height:       70.5 in Accession #:    [id number]   Weight:       165.0 lb Date of Birth:  [birth year]    BSA:          1.934 m Patient Age:    17 years     BP:           109/68 mmHg Patient Gender: M            HR:           110 bpm. Exam Location:  Inpatient Procedure: 2D Echo Indications:    Stroke  History:        Patient has no prior history of Echocardiogram examinations.                 Aortic and mitral replacement in 2015 at [facility] due                 to endocarditis; Aortic Valve Disease, Mitral Valve Disease and                 Endocarditis.  Sonographer:    Merrie Roof RDCS Referring Phys: [place]  1. Poor acoustic windows. Cannot exclude vegetations. Would recomm TEE to further evaluate if clinically indicated.  2. Left ventricular ejection fraction, by estimation, is 55 to  60%. The left ventricle has normal function. The left ventricle has no regional wall motion abnormalities. Indeterminate diastolic filling due to E-A fusion.  3. Right ventricular systolic function is normal. The right ventricular size is normal.  4. Left atrial size was mild to moderately dilated.  5. MV prosthesis (Epic bioprosthesis, 05/01/14) Not well visualized. Peak and mean gradients through the valveare 19 and 9 mm Hx respectively. Compared to echo report from 06/05/19 [facility]) mild increase in mean gradient (6 to 9 mm). The mitral valve has been repaired/replaced. Trivial mitral valve regurgitation.  6. AV prosthesis (23 mm Hancock bioprosthesis, 05/01/14) present Not well visualized. Peak and mean gradients through the valve are 51 and 30 mm Hg respectively Comparee to reprot for 8/117/20 [facility]), mean graidnet is increased. . The aortic valve has been repaired/replaced. Aortic valve regurgitation is trivial.  7. The inferior vena cava is normal in size with greater than 50% respiratory variability, suggesting right atrial pressure of 3 mmHg. FINDINGS  Left Ventricle: Left ventricular ejection fraction, by estimation, is 55 to 60%. The left ventricle has normal function. The left ventricle has no regional wall motion abnormalities. The left ventricular internal cavity size was normal in size. There is  no left ventricular hypertrophy. Indeterminate  diastolic filling due to E-A fusion. Right Ventricle: The right ventricular size is normal. Right vetricular wall thickness was not assessed. Right ventricular systolic function is normal. Left Atrium: Left atrial size was mild to moderately dilated. Right Atrium: Right atrial size was normal in size. Pericardium: There is no evidence of pericardial effusion. Mitral Valve: MV prosthesis (Epic bioprosthesis, 05/01/14) Not well visualized peak and mean gradients through the valveare 19 and 9 mm Hx respectively. Compared to echo reprot for 06/05/19 [facility]) mild increase in  mean gradient (6 to 9 mm). The mitral valve has been repaired/replaced. Trivial mitral valve regurgitation. MV peak gradient, 18.5 mmHg. The mean mitral valve gradient is 9.0 mmHg. Tricuspid Valve: The tricuspid valve is grossly normal. Tricuspid valve regurgitation is mild. Aortic Valve: AV prosthesis (23 mm Hancock bioprosthesis, 05/01/14) present Not well visualized. Peak and mean gradients through the valve are 51 and 30 mm Hg respectively Comparee to reprot for 8/117/20 [facility]), mean graidnet is increased. The aortic valve has been repaired/replaced. Aortic valve regurgitation is trivial. Aortic valve mean gradient measures 30.0 mmHg. Aortic valve peak gradient measures 50.7 mmHg. Aortic valve area, by VTI measures 0.43 cm. Pulmonic Valve: The pulmonic valve was not well visualized. Pulmonic valve regurgitation is not visualized. Aorta: The aortic root is normal in size and structure. Venous: The inferior vena cava is normal in size with greater than 50% respiratory variability, suggesting right atrial pressure of 3 mmHg. IAS/Shunts: The interatrial septum was not assessed.  LEFT VENTRICLE PLAX 2D LVIDd:         5.10 cm  Diastology LVIDs:         3.70 cm  LV e' lateral: 10.30 cm/s LV PW:         0.80 cm LV IVS:        1.00 cm LVOT diam:     2.00 cm LV SV:         33 LV SV Index:   17 LVOT Area:     3.14 cm  RIGHT VENTRICLE          IVC RV Basal diam:  3.80 cm  IVC diam: 1.90 cm LEFT ATRIUM             Index       RIGHT ATRIUM           Index LA diam:        5.20 cm 2.69 cm/m  RA Area:     17.40 cm LA Vol (A2C):   64.0 ml 33.10 ml/m RA Volume:   45.50 ml  23.53 ml/m LA Vol (A4C):   95.3 ml 49.29 ml/m LA Biplane Vol: 82.6 ml 42.72 ml/m  AORTIC VALVE AV Area (Vmax):    0.67 cm AV Area (Vmean):   0.50 cm AV Area (VTI):     0.43 cm AV Vmax:           356.00 cm/s AV Vmean:          262.000 cm/s AV VTI:            0.752 m AV Peak Grad:      50.7 mmHg AV Mean Grad:      30.0 mmHg LVOT Vmax:         75.50 cm/s  LVOT Vmean:        42.000 cm/s LVOT VTI:          0.104 m LVOT/AV VTI ratio: 0.14  AORTA Ao Root diam: 3.10 cm MITRAL VALVE  TRICUSPID VALVE MV Area VTI:  0.60 cm   TR Peak grad:   30.9 mmHg MV Peak grad: 18.5 mmHg  TR Vmax:        278.00 cm/s MV Mean grad: 9.0 mmHg MV Vmax:      2.15 m/s   SHUNTS MV Vmean:     139.0 cm/s Systemic VTI:  0.10 m                          Systemic Diam: 2.00 cm Dorris Carnes MD Electronically signed by Dorris Carnes MD Signature Date/Time: 01/06/2021/4:04:54 PM    Final     Marzetta Board, MD, PhD [facility]  Between 7 am - 7 pm I am available, please contact me via Amion or Securechat  Between 7 pm - 7 am I am not available, please contact night coverage MD/APP via Amion

## 2021-01-08 ENCOUNTER — Inpatient Hospital Stay (HOSPITAL_COMMUNITY): Payer: Medicare HMO

## 2021-01-08 ENCOUNTER — Inpatient Hospital Stay (HOSPITAL_COMMUNITY): Payer: Medicare HMO | Admitting: Certified Registered"

## 2021-01-08 ENCOUNTER — Encounter (HOSPITAL_COMMUNITY): Payer: Self-pay | Admitting: Internal Medicine

## 2021-01-08 ENCOUNTER — Encounter (HOSPITAL_COMMUNITY)
Admission: AD | Disposition: A | Discharge status: Skilled Nursing Facility | Payer: Self-pay | Source: Other Acute Inpatient Hospital | Attending: Internal Medicine

## 2021-01-08 ENCOUNTER — Other Ambulatory Visit: Payer: Self-pay

## 2021-01-08 DIAGNOSIS — R7881 Bacteremia: Secondary | ICD-10-CM | POA: Diagnosis not present

## 2021-01-08 DIAGNOSIS — K269 Duodenal ulcer, unspecified as acute or chronic, without hemorrhage or perforation: Secondary | ICD-10-CM | POA: Diagnosis not present

## 2021-01-08 DIAGNOSIS — I361 Nonrheumatic tricuspid (valve) insufficiency: Secondary | ICD-10-CM | POA: Diagnosis not present

## 2021-01-08 DIAGNOSIS — K253 Acute gastric ulcer without hemorrhage or perforation: Secondary | ICD-10-CM | POA: Diagnosis not present

## 2021-01-08 DIAGNOSIS — D62 Acute posthemorrhagic anemia: Secondary | ICD-10-CM | POA: Diagnosis not present

## 2021-01-08 HISTORY — PX: TEE WITHOUT CARDIOVERSION: SHX5443

## 2021-01-08 LAB — CBC
HCT: 27.4 % — ABNORMAL LOW (ref 39.0–52.0)
Hemoglobin: 9.1 g/dL — ABNORMAL LOW (ref 13.0–17.0)
MCH: 33.6 pg (ref 26.0–34.0)
MCHC: 33.2 g/dL (ref 30.0–36.0)
MCV: 101.1 fL — ABNORMAL HIGH (ref 80.0–100.0)
Platelets: 145 10*3/uL — ABNORMAL LOW (ref 150–400)
RBC: 2.71 MIL/uL — ABNORMAL LOW (ref 4.22–5.81)
RDW: 16.3 % — ABNORMAL HIGH (ref 11.5–15.5)
WBC: 4 10*3/uL (ref 4.0–10.5)
nRBC: 0 % (ref 0.0–0.2)

## 2021-01-08 LAB — TYPE AND SCREEN
ABO/RH(D): O POS
Antibody Screen: NEGATIVE
Unit division: 0
Unit division: 0
Unit division: 0

## 2021-01-08 LAB — COMPREHENSIVE METABOLIC PANEL
ALT: 9 U/L (ref 0–44)
AST: 12 U/L — ABNORMAL LOW (ref 15–41)
Albumin: 2.1 g/dL — ABNORMAL LOW (ref 3.5–5.0)
Alkaline Phosphatase: 38 U/L (ref 38–126)
Anion gap: 4 — ABNORMAL LOW (ref 5–15)
BUN: 10 mg/dL (ref 8–23)
CO2: 27 mmol/L (ref 22–32)
Calcium: 8 mg/dL — ABNORMAL LOW (ref 8.9–10.3)
Chloride: 107 mmol/L (ref 98–111)
Creatinine, Ser: 0.93 mg/dL (ref 0.61–1.24)
GFR, Estimated: >60 mL/min (ref >60)
Glucose, Bld: 92 mg/dL (ref 70–99)
Potassium: 4.2 mmol/L (ref 3.5–5.1)
Sodium: 138 mmol/L (ref 135–145)
Total Bilirubin: 0.6 mg/dL (ref 0.3–1.2)
Total Protein: 4.9 g/dL — ABNORMAL LOW (ref 6.5–8.1)

## 2021-01-08 LAB — BPAM RBC
Blood Product Expiration Date: 202204192359
Blood Product Expiration Date: 202204202359
Blood Product Expiration Date: 202204212359
ISSUE DATE / TIME: 202203190829
ISSUE DATE / TIME: 202203200946
Unit Type and Rh: 5100
Unit Type and Rh: 5100
Unit Type and Rh: 5100

## 2021-01-08 LAB — HEPARIN LEVEL (UNFRACTIONATED)
Heparin Unfractionated: 0.15 IU/mL — ABNORMAL LOW (ref 0.30–0.70)
Heparin Unfractionated: 0.1 IU/mL — ABNORMAL LOW (ref 0.30–0.70)

## 2021-01-08 SURGERY — ECHOCARDIOGRAM, TRANSESOPHAGEAL
Anesthesia: Monitor Anesthesia Care

## 2021-01-08 MED ORDER — PROPOFOL 10 MG/ML IV BOLUS
INTRAVENOUS | Status: DC | PRN
  Administered 2021-01-08 (×2): 25 mg via INTRAVENOUS
  Administered 2021-01-08: 20 mg via INTRAVENOUS

## 2021-01-08 MED ORDER — PROPOFOL 500 MG/50ML IV EMUL
INTRAVENOUS | Status: DC | PRN
  Administered 2021-01-08: 150 ug/kg/min via INTRAVENOUS

## 2021-01-08 MED ORDER — LIDOCAINE 2% (20 MG/ML) 5 ML SYRINGE
INTRAMUSCULAR | Status: DC | PRN
  Administered 2021-01-08: 80 mg via INTRAVENOUS

## 2021-01-08 MED ORDER — SODIUM CHLORIDE 0.9 % IV SOLN: INTRAVENOUS | Status: DC

## 2021-01-08 MED ORDER — FENTANYL CITRATE (PF) 100 MCG/2ML IJ SOLN
INTRAMUSCULAR | Status: DC | PRN
  Administered 2021-01-08 (×2): 25 ug via INTRAVENOUS

## 2021-01-08 NOTE — Progress Notes (Signed)
  Echocardiogram Echocardiogram Transesophageal has been performed.  Donald Harrington 01/08/2021, 11:08 AM

## 2021-01-08 NOTE — Progress Notes (Signed)
Physical Therapy Treatment Patient Details Name: Donald Harrington MRN: [id number] DOB: 1956-07-09 Today's Date: 01/08/2021    History of Present Illness 65 y.o. male presenting from outpatient MRI showing multifocal acute embolic strokes presents (subcentimeter bilateral cerebral and cerebellar infarcts). Also presenting with acute anemia and dark stools concerning for a GI bleed. Chest x-ray impression of COPD and bibasilar atelectasis or infiltrates, new since prior study. 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    PT Comments    Patient adamant on using [facility] for ambulation on arrival. Education provided and patient not receptive. Gait training with SPC and min guard, wide BOS and drifting L/R noted throughout with no overt LOB noted. Patient with sudden unexpected mood changes with questions. Patient with decreased safety awareness and decreased awareness of deficits. Continue to recommend comprehensive inpatient rehab (CIR) for post-acute therapy needs.     Follow Up Recommendations  CIR;Supervision/Assistance - 24 hour     Equipment Recommendations  None recommended by PT    Recommendations for Other Services       Precautions / Restrictions Precautions Precautions: Fall Precaution Comments: HOH; HR < 130 (return to bed if >130) Restrictions Weight Bearing Restrictions: No    Mobility  Bed Mobility Overal bed mobility: Modified Independent             General bed mobility comments: Supine to EOB with HOB slightly elevated.    Transfers Overall transfer level: Needs assistance Equipment used: Straight cane Transfers: Sit to/from Stand Sit to Stand: Min guard         General transfer comment: Min guard for steadying/safety.  Ambulation/Gait Ambulation/Gait assistance: Min guard Gait  Distance (Feet): 300 Feet Assistive device: Straight cane Gait Pattern/deviations: Step-through pattern;Decreased stride length;Drifts right/left;Wide base of support Gait velocity: decreased   General Gait Details: adamant about use of cane this session. Min guard throughout for safety as patient drifts L/R throughout but no overt LOB noted   Stairs             Wheelchair Mobility    Modified Rankin (Stroke Patients Only) Modified Rankin (Stroke Patients Only) Pre-Morbid Rankin Score: No symptoms Modified Rankin: Moderately severe disability     Balance Overall balance assessment: Needs assistance Sitting-balance support: No upper extremity supported;Feet supported Sitting balance-Leahy Scale: Good     Standing balance support: Single extremity supported Standing balance-Leahy Scale: Poor Standing balance comment: Reliant on at least unilateral UE support with mobility.                            Cognition Arousal/Alertness: Awake/alert Behavior During Therapy: WFL for tasks assessed/performed Overall Cognitive Status: Impaired/Different from baseline Area of Impairment: Memory;Problem solving                     Memory: Decreased recall of precautions;Decreased short-term memory       Problem Solving: Slow processing;Requires verbal cues General Comments: Patient easily agitated with questions and quick unexpected mood changes throughout. Frustrated when asked if he would like to sit in recliner with patient stating "I will never sit in the chair again after I was left there." On arrival, tells this therapist exactly what he is going to do and that he is going to use a cane and "not that walker"  Exercises      General Comments General comments (skin integrity, edema, etc.): VSS      Pertinent Vitals/Pain Pain Assessment: No/denies pain    Home Living                      Prior Function            PT Goals (current  goals can now be found in the care plan section) Acute Rehab PT Goals Patient Stated Goal: To return home independently. PT Goal Formulation: With patient Time For Goal Achievement: 01/19/21 Potential to Achieve Goals: Good Progress towards PT goals: Progressing toward goals    Frequency    Min 4X/week      PT Plan Current plan remains appropriate    Co-evaluation              AM-PAC PT "6 Clicks" Mobility   Outcome Measure  Help needed turning from your back to your side while in a flat bed without using bedrails?: None Help needed moving from lying on your back to sitting on the side of a flat bed without using bedrails?: None Help needed moving to and from a bed to a chair (including a wheelchair)?: A Little Help needed standing up from a chair using your arms (e.g., wheelchair or bedside chair)?: A Little Help needed to walk in hospital room?: A Little Help needed climbing 3-5 steps with a railing? : A Little 6 Click Score: 20    End of Session Equipment Utilized During Treatment: Gait belt Activity Tolerance: Patient tolerated treatment well Patient left: in bed;with call bell/phone within reach;with bed alarm set Nurse Communication: Mobility status PT Visit Diagnosis: Unsteadiness on feet (R26.81);Other abnormalities of gait and mobility (R26.89);Muscle weakness (generalized) (M62.81);Difficulty in walking, not elsewhere classified (R26.2);Other symptoms and signs involving the nervous system (R29.898)     Time: 1829-9371 PT Time Calculation (min) (ACUTE ONLY): 25 min  Charges:  $Gait Training: 23-37 mins                     Alayna A. Gilford Rile PT, DPT Acute Rehabilitation Services Pager [phone] Office [phone]    Linna Hoff 01/08/2021, 5:05 PM

## 2021-01-08 NOTE — Interval H&P Note (Signed)
History and Physical Interval Note:  01/08/2021 10:21 AM  [name]  has presented today for surgery, with the diagnosis of bacteremia.  The various methods of treatment have been discussed with the patient and family. After consideration of risks, benefits and other options for treatment, the patient has consented to  Procedure(s): TRANSESOPHAGEAL ECHOCARDIOGRAM (TEE) (N/A) as a surgical intervention.  The patient's history has been reviewed, patient examined, no change in status, stable for surgery.  I have reviewed the patient's chart and labs.  Questions were answered to the patient's satisfaction.     Donald Harrington

## 2021-01-08 NOTE — Progress Notes (Signed)
ANTICOAGULATION CONSULT NOTE - Follow-Up Consult  Pharmacy Consult for Heparin Indication: atrial fibrillation and stroke  No Known Allergies  Patient Measurements: Height: 5' 10.5" (179.1 cm) Weight: 79.8 kg (175 lb 14.8 oz) IBW/kg (Calculated) : 74.15 Heparin Dosing Weight: 74 kg  Vital Signs: Temp: 98.4 F (36.9 C) (03/23 0700) Temp Source: Oral (03/23 0700) BP: 125/93 (03/23 0700) Pulse Rate: 80 (03/23 0700)  Labs: Recent Labs    01/06/21 0410 01/06/21 1842 01/07/21 0323 01/07/21 1125 01/07/21 2150 01/08/21 0640  HGB 8.2*  --  8.8*  --   --  9.1*  HCT 25.7*  --  26.7*  --   --  27.4*  PLT 120*  --  125*  --   --  145*  HEPARINUNFRC  --    < > <0.10* <0.10* <0.10* 0.15*  CREATININE 1.06  --  1.01  --   --  0.93   < > = values in this interval not displayed.    Estimated Creatinine Clearance: 82 mL/min (by C-G formula based on SCr of 0.93 mg/dL).  Assessment: 32 YOM on warfarin PTA for hx Afib, prior CVA who presented on 3/18 with new strokes on MRI, concern for GIB. The patient is s/p EGD on 3/20 which showed erosive gastropathy and non-bleeding duodenal ulcer. GI okayed restart of anticoagulation. Pharmacy consulted to start Heparin for bridging - no bolus and low goal with CVA + GIB eval. The MD does not plan to resume Warfarin at this time as thinks the patient would likely benefit from transition to Apixaban.   Heparin level this morning remains SUBherapeutic but now detectable after multiple rate increases.  L-AC IV site checked by RN yesterday - some puffiness noted, RN stated not drawing back blood well. Switched Heparin to alternative IV site on R-arm.   CBC stable with Hgb 8-9's, plts 145 - will continue to monitor. No bleeding noted per discussion with RN.  Goal of Therapy:  Heparin level 0.3-0.5 units/ml Monitor platelets by anticoagulation protocol: Yes   Plan:  - Increase Heparin to 1650 units/hr  - No warfarin restart for now - MD evaluating  transition to Apixaban - Will continue to monitor for any signs/symptoms of bleeding and will follow up with heparin level in 6 hours   Dwayne A. Levada Dy, PharmD, BCPS, FNKF Clinical Pharmacist [place] Please utilize Amion for appropriate phone number to reach the unit pharmacist ([place])    **Pharmacist phone directory can now be found on [email or web address] ([email or web address]).  Listed under [place].

## 2021-01-08 NOTE — Transfer of Care (Signed)
Immediate Anesthesia Transfer of Care Note  Patient: Donald Harrington  Procedure(s) Performed: TRANSESOPHAGEAL ECHOCARDIOGRAM (TEE) (N/A )  Patient Location: Endoscopy Unit  Anesthesia Type:MAC  Level of Consciousness: awake and alert   Airway & Oxygen Therapy: Patient Spontanous Breathing and Patient connected to nasal cannula oxygen  Post-op Assessment: Report given to RN and Post -op Vital signs reviewed and stable  Post vital signs: Reviewed and stable  Last Vitals:  Vitals Value Taken Time  BP 90/67   Temp    Pulse 91 01/08/21 1100  Resp 22 01/08/21 1100  SpO2 89 % 01/08/21 1100  Vitals shown include unvalidated device data.  Last Pain:  Vitals:   01/08/21 1010  TempSrc:   PainSc: 10-Worst pain ever      Patients Stated Pain Goal: 0 (64/68/03 2122)  Complications: No complications documented.

## 2021-01-08 NOTE — Anesthesia Procedure Notes (Signed)
Procedure Name: MAC Date/Time: 01/08/2021 10:50 AM Performed by: Imagene Riches, CRNA Pre-anesthesia Checklist: Patient identified, Emergency Drugs available, Suction available, Patient being monitored and Timeout performed Patient Re-evaluated:Patient Re-evaluated prior to induction Oxygen Delivery Method: Nasal cannula

## 2021-01-08 NOTE — H&P (View-Only) (Signed)
[facility] PROGRESS NOTE    Progress Note  Dick Hark  EXH:[id number] DOB: 05/10/1956 DOA: 01/03/2021 PCP: Patient, No Pcp Per     Brief Narrative:   Donald Harrington is an 65 y.o. 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  Reports no abdominal pain nausea or vomiting black stools or red stools.  He is found to be tachycardic blood cultures were sent which were positive for Streptococcus ID was consulted  Significant events: EGD on 01/06/2019 due to shortness of gastritis, duodenal ulcer but no evidence of bleeding. Status post 2 units packed red blood cells 01/08/2019 orthopantogram multiple dental caries with residual dentition lucency within the maxilla related to absent tooth 10 which may represent an residual periapical abscess. 01/06/2021 2D echocardiogram showed poor acoustic window EF of 55 MV bioprosthetic valve not well visualized with a mean gradient of 9 mmHg.  AV bioprosthetic valve not well visualized with a mean gradient of 30 mmHg  Antibiotics: 3.21.2022 rocephin 3.21.2022 Gentamacin  3.22.2022 PCN  Microbiology data: 01/04/2021 blood culture: Streptococcus Gondi 01/07/2021 blood cultures  Procedures: None  Assessment/Plan:   Acute blood loss anemia/upper GI bleed: He reports taking Goody powder for neck pain. He was started on IV Protonix GI was consulted underwent EGD that showed gastritis and ulcer. Started on IV Protonix transfused 2 units of packed red blood cells his hemoglobin has improved. No further signs of bleeding. He status post 2 units of packed red blood cells. Surveillance blood cultures are pending.  Acute multifocal embolic  strokes: Likely in the setting of subtherapeutic INR, history of A. fib versus septic emboli/given bacteremia. Continue heparin for now.  Sepsis due to Streptococcus bacteremia/concern for endocarditis/in the setting of periodontal disease: Sepsis physiology on 01/05/2021, now resolved. Blood cultures grew streptococcal Gondi, 2D echo was done that showed no vegetation. TEE scheduled for 01/08/2021 to rule out endocarditis. Orthopantogram on admission showed possible abscess will consult dentist for tooth extractions.  Paroxysmal atrial fibrillation: On heparin currently in sinus rhythm.  Essential hypertension: Holding antihypertensive medication as he was hypotensive and septic on admission.    DVT prophylaxis: heparin Family Communication:none Status is: Inpatient  Remains inpatient appropriate because:Hemodynamically unstable   Dispo: The patient is from: Home              Anticipated d/c is to: SNF              Patient currently is not medically stable to d/c.   Difficult to place patient No        Code Status:     Code Status Orders  (From admission, onward)         Start     Ordered   01/03/21 2051  Full code  Continuous        01/03/21 2052        Code Status History    This patient has a current code status but no historical code status.   Advance Care Planning Activity        IV Access:    Peripheral IV   Procedures and diagnostic studies:   DG Orthopantogram  Result Date: 01/07/2021 CLINICAL DATA:  Endocarditis EXAM: ORTHOPANTOGRAM/PANORAMIC COMPARISON:  None.  FINDINGS: The mandible is intact. The maxilla is intact. There is numerous absent dentition involving the maxillary and mandibular molars and the majority of a maxillary and mandibular premolars.z dental caries are identified within tooth 8, the residual fragment of tooth 13, tooth 24, and tooth 25. A lucent lesion is seen within the maxilla within the defect related to the absent  tooth 10 demonstrating relatively poorly circumscribed margins and no significant matrix which may represent a residual periapical abscess. IMPRESSION: Multiple dental caries within the residual dentition as described above. Lucency within the maxilla within the defect related to the absent tooth 10 which may represent a a residual periapical abscess. Electronically Signed   By: [name]   On: 01/07/2021 11:20   ECHOCARDIOGRAM COMPLETE  Result Date: 01/06/2021    ECHOCARDIOGRAM REPORT   Patient Name:   Donald Harrington Date of Exam: 01/05/2021 Medical Rec #:  [id number]    Height:       70.5 in Accession #:    [id number]   Weight:       165.0 lb Date of Birth:  31-Mar-1956    BSA:          1.934 m Patient Age:    9 years     BP:           109/68 mmHg Patient Gender: M            HR:           110 bpm. Exam Location:  Inpatient Procedure: 2D Echo Indications:    Stroke  History:        Patient has no prior history of Echocardiogram examinations.                 Aortic and mitral replacement in 2015 at [facility] due                 to endocarditis; Aortic Valve Disease, Mitral Valve Disease and                 Endocarditis.  Sonographer:    Merrie Roof RDCS Referring Phys: [place]  1. Poor acoustic windows. Cannot exclude vegetations. Would recomm TEE to further evaluate if clinically indicated.  2. Left ventricular ejection fraction, by estimation, is 55 to 60%. The left ventricle has normal function. The left ventricle has no regional wall motion abnormalities. Indeterminate diastolic filling due to E-A fusion.  3. Right ventricular systolic function is normal. The right ventricular size is normal.  4. Left atrial size was mild to moderately dilated.  5. MV prosthesis (Epic bioprosthesis, 05/01/14) Not well visualized. Peak and mean gradients through the valveare 19 and 9 mm Hx respectively. Compared to echo report from 06/05/19 [facility]) mild increase in mean gradient (6 to 9  mm). The mitral valve has been repaired/replaced. Trivial mitral valve regurgitation.  6. AV prosthesis (23 mm Hancock bioprosthesis, 05/01/14) present Not well visualized. Peak and mean gradients through the valve are 51 and 30 mm Hg respectively Comparee to reprot for 8/117/20 [facility]), mean graidnet is increased. . The aortic valve has been repaired/replaced. Aortic valve regurgitation is trivial.  7. The inferior vena cava is normal in size with greater than 50% respiratory variability, suggesting right atrial pressure of 3 mmHg. FINDINGS  Left Ventricle: Left ventricular ejection fraction, by estimation, is 55 to 60%. The left ventricle has normal function. The left ventricle has no regional wall motion abnormalities. The left ventricular internal  cavity size was normal in size. There is  no left ventricular hypertrophy. Indeterminate diastolic filling due to E-A fusion. Right Ventricle: The right ventricular size is normal. Right vetricular wall thickness was not assessed. Right ventricular systolic function is normal. Left Atrium: Left atrial size was mild to moderately dilated. Right Atrium: Right atrial size was normal in size. Pericardium: There is no evidence of pericardial effusion. Mitral Valve: MV prosthesis (Epic bioprosthesis, 05/01/14) Not well visualized peak and mean gradients through the valveare 19 and 9 mm Hx respectively. Compared to echo reprot for 06/05/19 [facility]) mild increase in mean gradient (6 to 9 mm). The mitral valve has been repaired/replaced. Trivial mitral valve regurgitation. MV peak gradient, 18.5 mmHg. The mean mitral valve gradient is 9.0 mmHg. Tricuspid Valve: The tricuspid valve is grossly normal. Tricuspid valve regurgitation is mild. Aortic Valve: AV prosthesis (23 mm Hancock bioprosthesis, 05/01/14) present Not well visualized. Peak and mean gradients through the valve are 51 and 30 mm Hg respectively Comparee to reprot for 8/117/20 [facility]), mean graidnet is increased. The aortic  valve has been repaired/replaced. Aortic valve regurgitation is trivial. Aortic valve mean gradient measures 30.0 mmHg. Aortic valve peak gradient measures 50.7 mmHg. Aortic valve area, by VTI measures 0.43 cm. Pulmonic Valve: The pulmonic valve was not well visualized. Pulmonic valve regurgitation is not visualized. Aorta: The aortic root is normal in size and structure. Venous: The inferior vena cava is normal in size with greater than 50% respiratory variability, suggesting right atrial pressure of 3 mmHg. IAS/Shunts: The interatrial septum was not assessed.  LEFT VENTRICLE PLAX 2D LVIDd:         5.10 cm  Diastology LVIDs:         3.70 cm  LV e' lateral: 10.30 cm/s LV PW:         0.80 cm LV IVS:        1.00 cm LVOT diam:     2.00 cm LV SV:         33 LV SV Index:   17 LVOT Area:     3.14 cm  RIGHT VENTRICLE          IVC RV Basal diam:  3.80 cm  IVC diam: 1.90 cm LEFT ATRIUM             Index       RIGHT ATRIUM           Index LA diam:        5.20 cm 2.69 cm/m  RA Area:     17.40 cm LA Vol (A2C):   64.0 ml 33.10 ml/m RA Volume:   45.50 ml  23.53 ml/m LA Vol (A4C):   95.3 ml 49.29 ml/m LA Biplane Vol: 82.6 ml 42.72 ml/m  AORTIC VALVE AV Area (Vmax):    0.67 cm AV Area (Vmean):   0.50 cm AV Area (VTI):     0.43 cm AV Vmax:           356.00 cm/s AV Vmean:          262.000 cm/s AV VTI:            0.752 m AV Peak Grad:      50.7 mmHg AV Mean Grad:      30.0 mmHg LVOT Vmax:         75.50 cm/s LVOT Vmean:        42.000 cm/s LVOT VTI:          0.104 m LVOT/AV VTI  ratio: 0.14  AORTA Ao Root diam: 3.10 cm MITRAL VALVE             TRICUSPID VALVE MV Area VTI:  0.60 cm   TR Peak grad:   30.9 mmHg MV Peak grad: 18.5 mmHg  TR Vmax:        278.00 cm/s MV Mean grad: 9.0 mmHg MV Vmax:      2.15 m/s   SHUNTS MV Vmean:     139.0 cm/s Systemic VTI:  0.10 m                          Systemic Diam: 2.00 cm Dorris Carnes MD Electronically signed by Dorris Carnes MD Signature Date/Time: 01/06/2021/4:04:54 PM    Final      Medical  Consultants:    None.   Subjective:    [name] grouchy complaining he looks like a woman due to the underwear as he has.  Objective:    Vitals:   01/07/21 2315 01/07/21 2322 01/08/21 0311 01/08/21 0700  BP: 133/84 128/81 123/81 (!) 125/93  Pulse: 85 99 98 80  Resp: 16 18 18 16   Temp: 99.1 F (37.3 C) 98.8 F (37.1 C) 98.2 F (36.8 C) 98.4 F (36.9 C)  TempSrc: Axillary Oral Oral Oral  SpO2: 100% 94% 93% 100%  Weight:   79.8 kg   Height:       SpO2: 100 % O2 Flow Rate (L/min): 5 L/min FiO2 (%): 28 %   Intake/Output Summary (Last 24 hours) at 01/08/2021 0814 Last data filed at 01/08/2021 0545 Gross per 24 hour  Intake 1863.91 ml  Output 2000 ml  Net -136.09 ml   Filed Weights   01/05/21 0309 01/05/21 0912 01/08/21 0311  Weight: 78.9 kg 74.8 kg 79.8 kg    Exam: General exam: In no acute distress. Respiratory system: Good air movement and clear to auscultation. Cardiovascular system: S1 & S2 heard, RRR. No JVD. Gastrointestinal system: Abdomen is nondistended, soft and nontender.  Extremities: No pedal edema. Skin: No rashes, lesions or ulcers  Data Reviewed:    Labs: Basic Metabolic Panel: Recent Labs  Lab 01/04/21 0322 01/05/21 0320 01/06/21 0410 01/07/21 0323 01/08/21 0640  NA 137 137 135 139 138  K 4.5 3.6 3.6 4.0 4.2  CL 103 105 105 109 107  CO2 24 21* 23 23 27   GLUCOSE 97 90 96 103* 92  BUN 15 15 15 12 10   CREATININE 1.29* 1.11 1.06 1.01 0.93  CALCIUM 8.1* 7.8* 7.7* 7.7* 8.0*  MG  --  2.0  --   --   --   PHOS  --  3.0  --   --   --    GFR Estimated Creatinine Clearance: 82 mL/min (by C-G formula based on SCr of 0.93 mg/dL). Liver Function Tests: Recent Labs  Lab 01/04/21 0322 01/05/21 0320 01/07/21 0323 01/08/21 0640  AST 17 14* 12* 12*  ALT 17 14 11 9   ALKPHOS 42 38 39 38  BILITOT 1.2 0.9 0.4 0.6  PROT 5.2* 4.9* 4.8* 4.9*  ALBUMIN 2.4* 2.3* 2.0* 2.1*   No results for input(s): LIPASE, AMYLASE in the last 168 hours. No  results for input(s): AMMONIA in the last 168 hours. Coagulation profile Recent Labs  Lab 01/05/21 0320  INR 1.5*   COVID-19 Labs  No results for input(s): DDIMER, FERRITIN, LDH, CRP in the last 72 hours.  Lab Results  Component Value Date   SARSCOV2NAA  NEGATIVE 01/05/2021    CBC: Recent Labs  Lab 01/04/21 0322 01/04/21 1511 01/05/21 0320 01/06/21 0410 01/07/21 0323 01/08/21 0640  WBC 3.5*  --  4.1 3.3* 3.7* 4.0  HGB 7.0* 7.8* 7.6* 8.2* 8.8* 9.1*  HCT 21.4* 24.0* 23.2* 25.7* 26.7* 27.4*  MCV 105.9*  --  102.2* 102.0* 101.1* 101.1*  PLT 125*  --  124* 120* 125* 145*   Cardiac Enzymes: No results for input(s): CKTOTAL, CKMB, CKMBINDEX, TROPONINI in the last 168 hours. BNP (last 3 results) No results for input(s): PROBNP in the last 8760 hours. CBG: No results for input(s): GLUCAP in the last 168 hours. D-Dimer: No results for input(s): DDIMER in the last 72 hours. Hgb A1c: No results for input(s): HGBA1C in the last 72 hours. Lipid Profile: No results for input(s): CHOL, HDL, LDLCALC, TRIG, CHOLHDL, LDLDIRECT in the last 72 hours. Thyroid function studies: No results for input(s): TSH, T4TOTAL, T3FREE, THYROIDAB in the last 72 hours.  Invalid input(s): FREET3 Anemia work up: No results for input(s): VITAMINB12, FOLATE, FERRITIN, TIBC, IRON, RETICCTPCT in the last 72 hours. Sepsis Labs: Recent Labs  Lab 01/05/21 0320 01/06/21 0410 01/07/21 0323 01/08/21 0640  WBC 4.1 3.3* 3.7* 4.0   Microbiology Recent Results (from the past 240 hour(s))  Culture, blood (Routine X 2) w Reflex to ID Panel     Status: Abnormal   Collection Time: 01/04/21  3:04 PM   Specimen: BLOOD LEFT HAND  Result Value Ref Range Status   Specimen Description BLOOD LEFT HAND  Final   Special Requests   Final    BOTTLES DRAWN AEROBIC AND ANAEROBIC Blood Culture results may not be optimal due to an inadequate volume of blood received in culture bottles   Culture  Setup Time   Final     GRAM POSITIVE COCCI IN CHAINS IN BOTH AEROBIC AND ANAEROBIC BOTTLES CRITICAL RESULT CALLED TO, READ BACK BY AND VERIFIED WITH: J,MILLEN PHARMD @0851  01/05/21 EB Performed at [facility], [place] [address] [place], [place] [address]    Culture STREPTOCOCCUS GORDONII (A)  Final   Report Status 01/07/2021 FINAL  Final   Organism ID, Bacteria STREPTOCOCCUS GORDONII  Final      Susceptibility   Streptococcus gordonii - MIC*    PENICILLIN <=0.06 SENSITIVE Sensitive     CEFTRIAXONE <=0.12 SENSITIVE Sensitive     ERYTHROMYCIN 2 RESISTANT Resistant     LEVOFLOXACIN 1 SENSITIVE Sensitive     VANCOMYCIN 0.5 SENSITIVE Sensitive     * STREPTOCOCCUS GORDONII  Blood Culture ID Panel (Reflexed)     Status: Abnormal   Collection Time: 01/04/21  3:04 PM  Result Value Ref Range Status   Enterococcus faecalis NOT DETECTED NOT DETECTED Final   Enterococcus Faecium NOT DETECTED NOT DETECTED Final   Listeria monocytogenes NOT DETECTED NOT DETECTED Final   Staphylococcus species NOT DETECTED NOT DETECTED Final   Staphylococcus aureus (BCID) NOT DETECTED NOT DETECTED Final   Staphylococcus epidermidis NOT DETECTED NOT DETECTED Final   Staphylococcus lugdunensis NOT DETECTED NOT DETECTED Final   Streptococcus species DETECTED (A) NOT DETECTED Final    Comment: Not Enterococcus species, Streptococcus agalactiae, Streptococcus pyogenes, or Streptococcus pneumoniae. CRITICAL RESULT CALLED TO, READ BACK BY AND VERIFIED WITH: J,MILLEN PHARMD @0851  01/05/21 EB    Streptococcus agalactiae NOT DETECTED NOT DETECTED Final   Streptococcus pneumoniae NOT DETECTED NOT DETECTED Final   Streptococcus pyogenes NOT DETECTED NOT DETECTED Final   A.calcoaceticus-baumannii NOT DETECTED NOT DETECTED Final   Bacteroides  fragilis NOT DETECTED NOT DETECTED Final   Enterobacterales NOT DETECTED NOT DETECTED Final   Enterobacter cloacae complex NOT DETECTED NOT DETECTED Final   Escherichia coli NOT DETECTED NOT DETECTED  Final   Klebsiella aerogenes NOT DETECTED NOT DETECTED Final   Klebsiella oxytoca NOT DETECTED NOT DETECTED Final   Klebsiella pneumoniae NOT DETECTED NOT DETECTED Final   Proteus species NOT DETECTED NOT DETECTED Final   Salmonella species NOT DETECTED NOT DETECTED Final   Serratia marcescens NOT DETECTED NOT DETECTED Final   Haemophilus influenzae NOT DETECTED NOT DETECTED Final   Neisseria meningitidis NOT DETECTED NOT DETECTED Final   Pseudomonas aeruginosa NOT DETECTED NOT DETECTED Final   Stenotrophomonas maltophilia NOT DETECTED NOT DETECTED Final   Candida albicans NOT DETECTED NOT DETECTED Final   Candida auris NOT DETECTED NOT DETECTED Final   Candida glabrata NOT DETECTED NOT DETECTED Final   Candida krusei NOT DETECTED NOT DETECTED Final   Candida parapsilosis NOT DETECTED NOT DETECTED Final   Candida tropicalis NOT DETECTED NOT DETECTED Final   Cryptococcus neoformans/gattii NOT DETECTED NOT DETECTED Final    Comment: Performed at [facility], [place] [address], [place], [place] [address]  Culture, blood (Routine X 2) w Reflex to ID Panel     Status: Abnormal   Collection Time: 01/04/21  3:11 PM   Specimen: BLOOD LEFT HAND  Result Value Ref Range Status   Specimen Description BLOOD LEFT HAND  Final   Special Requests   Final    BOTTLES DRAWN AEROBIC ONLY Blood Culture adequate volume   Culture  Setup Time   Final    GRAM POSITIVE COCCI IN CHAINS AEROBIC BOTTLE ONLY CRITICAL VALUE NOTED.  VALUE IS CONSISTENT WITH PREVIOUSLY REPORTED AND CALLED VALUE.    Culture (A)  Final    STREPTOCOCCUS GORDONII SUSCEPTIBILITIES PERFORMED ON PREVIOUS CULTURE WITHIN THE LAST 5 DAYS. Performed at [facility], [place] [address], [place], [name] [address]    Report Status 01/07/2021 FINAL  Final  Resp Panel by RT-PCR (Flu A&B, Covid) Nasopharyngeal Swab     Status: None   Collection Time: 01/05/21  6:31 AM   Specimen: Nasopharyngeal Swab; Nasopharyngeal(NP) swabs in vial  transport medium  Result Value Ref Range Status   SARS Coronavirus 2 by RT PCR NEGATIVE NEGATIVE Final    Comment: (NOTE) SARS-CoV-2 target nucleic acids are NOT DETECTED.  The SARS-CoV-2 RNA is generally detectable in upper respiratory specimens during the acute phase of infection. The lowest concentration of SARS-CoV-2 viral copies this assay can detect is 138 copies/mL. A negative result does not preclude SARS-Cov-2 infection and should not be used as the sole basis for treatment or other patient management decisions. A negative result may occur with  improper specimen collection/handling, submission of specimen other than nasopharyngeal swab, presence of viral mutation(s) within the areas targeted by this assay, and inadequate number of viral copies(<138 copies/mL). A negative result must be combined with clinical observations, patient history, and epidemiological information. The expected result is Negative.  Fact Sheet for Patients:  EntrepreneurPulse.com.au  Fact Sheet for Healthcare Providers:  IncredibleEmployment.be  This test is no t yet approved or cleared by the [place] FDA and  has been authorized for detection and/or diagnosis of SARS-CoV-2 by FDA under an Emergency Use Authorization (EUA). This EUA will remain  in effect (meaning this test can be used) for the duration of the COVID-19 declaration under Section 564(b)(1) of the Act, 21 U.S.C.section 360bbb-3(b)(1), unless the authorization  is terminated  or revoked sooner.       Influenza A by PCR NEGATIVE NEGATIVE Final   Influenza B by PCR NEGATIVE NEGATIVE Final    Comment: (NOTE) The Xpert Xpress SARS-CoV-2/FLU/RSV plus assay is intended as an aid in the diagnosis of influenza from Nasopharyngeal swab specimens and should not be used as a sole basis for treatment. Nasal washings and aspirates are unacceptable for Xpert Xpress SARS-CoV-2/FLU/RSV testing.  Fact  Sheet for Patients: EntrepreneurPulse.com.au  Fact Sheet for Healthcare Providers: IncredibleEmployment.be  This test is not yet approved or cleared by the [place] FDA and has been authorized for detection and/or diagnosis of SARS-CoV-2 by FDA under an Emergency Use Authorization (EUA). This EUA will remain in effect (meaning this test can be used) for the duration of the COVID-19 declaration under Section 564(b)(1) of the Act, 21 U.S.C. section 360bbb-3(b)(1), unless the authorization is terminated or revoked.  Performed at [facility], [place] [address] [place], [place] [address]   Culture, blood (Routine X 2) w Reflex to ID Panel     Status: None (Preliminary result)   Collection Time: 01/07/21 11:24 AM   Specimen: BLOOD RIGHT FOREARM  Result Value Ref Range Status   Specimen Description BLOOD RIGHT FOREARM  Final   Special Requests   Final    BOTTLES DRAWN AEROBIC AND ANAEROBIC Blood Culture results may not be optimal due to an inadequate volume of blood received in culture bottles   Culture   Final    NO GROWTH < 24 HOURS Performed at [facility], [place] [address], [place], [place] [address]    Report Status PENDING  Incomplete  Culture, blood (Routine X 2) w Reflex to ID Panel     Status: None (Preliminary result)   Collection Time: 01/07/21 11:29 AM   Specimen: BLOOD RIGHT HAND  Result Value Ref Range Status   Specimen Description BLOOD RIGHT HAND  Final   Special Requests   Final    BOTTLES DRAWN AEROBIC AND ANAEROBIC Blood Culture results may not be optimal due to an inadequate volume of blood received in culture bottles   Culture   Final    NO GROWTH < 24 HOURS Performed at [facility], [place] [address] [place], [place] [address]    Report Status PENDING  Incomplete     Medications:   . sodium chloride   Intravenous Once  . melatonin  5 mg Oral QHS  . metoprolol tartrate  25 mg Oral BID  . pantoprazole   40 mg Intravenous Q12H   Continuous Infusions: . sodium chloride 50 mL/hr at 01/07/21 2114  . gentamicin 200 mg (01/07/21 2301)  . heparin 1,650 Units/hr (01/08/21 0755)  . penicillin g continuous IV infusion 12 Million Units (01/08/21 0543)      LOS: 5 days   [name]  [facility]  01/08/2021, 8:14 AM

## 2021-01-08 NOTE — Progress Notes (Signed)
Occupational Therapy Treatment Patient Details Name: Donald Harrington MRN: [id number] DOB: 10-26-1955 Today's Date: 01/08/2021    History of present illness 65 y.o. male presenting from outpatient MRI showing multifocal acute embolic strokes presents (subcentimeter bilateral cerebral and cerebellar infarcts). Also presenting with acute anemia and dark stools concerning for a GI bleed. Chest x-ray impression of COPD and bibasilar atelectasis or infiltrates, new since prior study. 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   OT comments  OT treatment session with focus on NMR, short-distance functional mobility with use of RW and self-care re-education. Patient initially pleasant and cooperative completing bed mobility, LB dressing, toileting/hygiene/clothing management and hand washing at sink level with Min guard and use of RW with cues for walker management and pacing. At conclusion of session patient began fixating on placement of objects in room and became upset yelling at this therapist about position of bed rail (patient seated in chair on opposite side of the room at this time). Patient continues to be limited by deficits listed below 2/2 diagnosis above and would benefit from continued acute OT services in prep for safe d/c to next level of care. Continued recommendation for CIR pending patient progress.   Follow Up Recommendations  CIR    Equipment Recommendations  Tub/shower seat    Recommendations for Other Services      Precautions / Restrictions Precautions Precautions: Fall Precaution Comments: HOH; HR < 130 (return to bed if >130) Restrictions Weight Bearing Restrictions: No       Mobility Bed Mobility Overal bed mobility: Modified Independent             General bed mobility comments: Supine to EOB  with HOB slightly elevated.    Transfers Overall transfer level: Needs assistance Equipment used: Rolling walker (2 wheeled);1 person hand held assist Transfers: Sit to/from Stand Sit to Stand: Min guard         General transfer comment: Min guard for steadying/safety.    Balance Overall balance assessment: Needs assistance Sitting-balance support: No upper extremity supported;Feet supported Sitting balance-Leahy Scale: Good     Standing balance support: Bilateral upper extremity supported Standing balance-Leahy Scale: Poor Standing balance comment: Reliant on at least unilateral UE support with mobility.                           ADL either performed or assessed with clinical judgement   ADL Overall ADL's : Needs assistance/impaired     Grooming: Min guard;Standing Grooming Details (indicate cue type and reason): Hand washing standing at sink level with Min guard for steadying.             Lower Body Dressing: Min guard;Sit to/from stand Lower Body Dressing Details (indicate cue type and reason): Able to don pants in sitting/standing with Min guard for safety. Increased time/effort to thread BLE. Toilet Transfer: Min guard;Ambulation;RW Toilet Transfer Details (indicate cue type and reason): RW over standard commode in bathroom. Toileting- Water quality scientist and Hygiene: Min guard;Sit to/from stand Toileting - Clothing Manipulation Details (indicate cue type and reason): Min guard for steadying/safety.     Functional mobility during ADLs: Min guard;Rolling walker General ADL Comments: Pt presenting with decreased balance, strength, and functional use of LUE.     Vision       Perception  Praxis      Cognition Arousal/Alertness: Awake/alert Behavior During Therapy: WFL for tasks assessed/performed Overall Cognitive Status: Impaired/Different from baseline Area of Impairment: Memory;Problem solving                     Memory:  Decreased recall of precautions;Decreased short-term memory       Problem Solving: Slow processing;Requires verbal cues General Comments: Patient with low frustration tolerance and quick mood changes. Became very frustrated with change in room set-up at conclusion of session with patient seated in recliner. Fixates on situations and placement of objects in room. Requires thorough explanation of plan before initiation of movement.        Exercises     Shoulder Instructions       General Comments      Pertinent Vitals/ Pain       Pain Assessment: No/denies pain  Home Living                                          Prior Functioning/Environment              Frequency  Min 3X/week        Progress Toward Goals  OT Goals(current goals can now be found in the care plan section)  Progress towards OT goals: Progressing toward goals  Acute Rehab OT Goals Patient Stated Goal: To return home independently. OT Goal Formulation: With patient Time For Goal Achievement: 01/20/21 Potential to Achieve Goals: Good ADL Goals Pt Will Perform Grooming: with modified independence;standing Pt Will Perform Lower Body Dressing: with modified independence;sit to/from stand;sitting/lateral leans Pt Will Transfer to Toilet: with modified independence;ambulating;regular height toilet Pt Will Perform Tub/Shower Transfer: with supervision;Shower transfer;shower seat;ambulating Pt/caregiver will Perform Home Exercise Program: Increased strength;Left upper extremity;With Supervision;With written HEP provided Additional ADL Goal #1: Pt will perform simple IADL with 2-3 cues  Plan Discharge plan remains appropriate;Frequency remains appropriate    Co-evaluation                 AM-PAC OT "6 Clicks" Daily Activity     Outcome Measure   Help from another person eating meals?: None Help from another person taking care of personal grooming?: A Little Help from another  person toileting, which includes using toliet, bedpan, or urinal?: A Little Help from another person bathing (including washing, rinsing, drying)?: A Little Help from another person to put on and taking off regular upper body clothing?: A Little Help from another person to put on and taking off regular lower body clothing?: A Little 6 Click Score: 19    End of Session Equipment Utilized During Treatment: Gait belt;Rolling walker  OT Visit Diagnosis: Unsteadiness on feet (R26.81);Other abnormalities of gait and mobility (R26.89);Muscle weakness (generalized) (M62.81);Hemiplegia and hemiparesis;Pain Hemiplegia - Right/Left: Left Hemiplegia - dominant/non-dominant: Non-Dominant Hemiplegia - caused by: Cerebral infarction   Activity Tolerance Patient tolerated treatment well   Patient Left in chair;with call bell/phone within reach;with chair alarm set   Nurse Communication Mobility status        Time: 9417-4081 OT Time Calculation (min): 23 min  Charges: OT General Charges $OT Visit: 1 Visit OT Treatments $Self Care/Home Management : 23-37 mins  Shin Lamour H. OTR/L Supplemental OT, Department of rehab services [phone]   Kella Splinter R H. 01/08/2021, 2:10 PM

## 2021-01-08 NOTE — Plan of Care (Signed)
  Problem: Education: Goal: Knowledge of patient specific risk factors addressed and post discharge goals established will improve Outcome: Progressing   Problem: Education: Goal: Knowledge of secondary prevention will improve Outcome: Progressing   Problem: Education: Goal: Knowledge of secondary prevention will improve Outcome: Progressing

## 2021-01-08 NOTE — CV Procedure (Signed)
    Transesophageal Echocardiogram Note  Donald Harrington [id number] [birth year]  Procedure: Transesophageal Echocardiogram Indications: bacteremia   Procedure Details Consent: Obtained Time Out: Verified patient identification, verified procedure, site/side was marked, verified correct patient position, special equipment/implants available, Radiology Safety Procedures followed,  medications/allergies/relevent history reviewed, required imaging and test results available.  Performed  Medications:  During this procedure the patient is administered Fentanyl 50 mcg IV , Lidocaine 80 mg, Propofol drip 270 mg by Beverlee Nims CRNA for  sedation.  The patient's heart rate, blood pressure, and oxygen saturation are monitored continuously during the procedure. The period of conscious sedation is 30  minutes, of which I was present face-to-face 100% of this time.  Left Ventrical:  LV function . Lower limits of normal .  50%.   Mitral Valve: prosthetic MV.  No vegetation , no MR  Aortic Valve:  Prosthetic AV.  No vegetation . No AI   Tricuspid Valve: no vegetation .   Mild TR   Pulmonic Valve: trivial PI.  PV is not seen well   Left Atrium/ Left atrial appendage: no thrombus   Atrial septum: no ASD or PFO   Aorta: normal   Complications: No apparent complications Patient did tolerate procedure well.   Thayer Headings, Brooke Bonito., MD, [facility] 01/08/2021, 10:55 AM

## 2021-01-08 NOTE — Progress Notes (Signed)
OT Cancellation Note  Patient Details Name: Donald Harrington MRN: [id number] DOB: Mar 01, 1956   Cancelled Treatment:    Reason Eval/Treat Not Completed: Patient at procedure or test/ unavailable. OT to check back as time allows.   Gloris Manchester OTR/L Supplemental OT, Department of rehab services [phone]   01/08/2021, 11:05 AM

## 2021-01-08 NOTE — Anesthesia Preprocedure Evaluation (Signed)
Anesthesia Evaluation  Patient identified by MRN, date of birth, ID band Patient awake    Reviewed: Allergy & Precautions, NPO status , Patient's Chart, lab work & pertinent test results, reviewed documented beta blocker date and time   Airway Mallampati: II  TM Distance: >3 FB Neck ROM: Full    Dental  (+) Edentulous Upper, Partial Lower, Dental Advisory Given, Missing, Poor Dentition   Pulmonary neg pulmonary ROS, Current Smoker and Patient abstained from smoking.,    Pulmonary exam normal breath sounds clear to auscultation       Cardiovascular hypertension, Pt. on home beta blockers + CAD and +CHF  + dysrhythmias Atrial Fibrillation + Valvular Problems/Murmurs  Rhythm:Regular Rate:Tachycardia + Systolic murmurs    Neuro/Psych PSYCHIATRIC DISORDERS CVA    GI/Hepatic Neg liver ROS, PUD,   Endo/Other  Hypothyroidism   Renal/GU negative Renal ROS     Musculoskeletal negative musculoskeletal ROS (+)   Abdominal   Peds  Hematology  (+) Blood dyscrasia, anemia ,   Anesthesia Other Findings   Reproductive/Obstetrics                             Anesthesia Physical  Anesthesia Plan  ASA: III  Anesthesia Plan: MAC   Post-op Pain Management:    Induction: Intravenous  PONV Risk Score and Plan: 0 and Propofol infusion and Treatment may vary due to age or medical condition  Airway Management Planned: Natural Airway and Mask  Additional Equipment: None  Intra-op Plan:   Post-operative Plan:   Informed Consent: I have reviewed the patients History and Physical, chart, labs and discussed the procedure including the risks, benefits and alternatives for the proposed anesthesia with the patient or authorized representative who has indicated his/her understanding and acceptance.     Dental advisory given  Plan Discussed with: CRNA  Anesthesia Plan Comments:         Anesthesia  Quick Evaluation

## 2021-01-08 NOTE — Progress Notes (Signed)
[facility] PROGRESS NOTE    Progress Note  Donald Harrington  EGB:[id number] DOB: 1956-01-22 DOA: 01/03/2021 PCP: Patient, No Pcp Per     Brief Narrative:   Donald Harrington is an 65 y.o. 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  Reports no abdominal pain nausea or vomiting black stools or red stools.  He is found to be tachycardic blood cultures were sent which were positive for Streptococcus ID was consulted  Significant events: EGD on 01/06/2019 due to shortness of gastritis, duodenal ulcer but no evidence of bleeding. Status post 2 units packed red blood cells 01/08/2019 orthopantogram multiple dental caries with residual dentition lucency within the maxilla related to absent tooth 10 which may represent an residual periapical abscess. 01/06/2021 2D echocardiogram showed poor acoustic window EF of 55 MV bioprosthetic valve not well visualized with a mean gradient of 9 mmHg.  AV bioprosthetic valve not well visualized with a mean gradient of 30 mmHg  Antibiotics: 3.21.2022 rocephin 3.21.2022 Gentamacin  3.22.2022 PCN  Microbiology data: 01/04/2021 blood culture: Streptococcus Gondi 01/07/2021 blood cultures  Procedures: None  Assessment/Plan:   Acute blood loss anemia/upper GI bleed: He reports taking Goody powder for neck pain. He was started on IV Protonix GI was consulted underwent EGD that showed gastritis and ulcer. Started on IV Protonix transfused 2 units of packed red blood cells his hemoglobin has improved. No further signs of bleeding. He status post 2 units of packed red blood cells. Surveillance blood cultures are pending.  Acute multifocal embolic  strokes: Likely in the setting of subtherapeutic INR, history of A. fib versus septic emboli/given bacteremia. Continue heparin for now.  Sepsis due to Streptococcus bacteremia/concern for endocarditis/in the setting of periodontal disease: Sepsis physiology on 01/05/2021, now resolved. Blood cultures grew streptococcal Gondi, 2D echo was done that showed no vegetation. TEE scheduled for 01/08/2021 to rule out endocarditis. Orthopantogram on admission showed possible abscess will consult dentist for tooth extractions.  Paroxysmal atrial fibrillation: On heparin currently in sinus rhythm.  Essential hypertension: Holding antihypertensive medication as he was hypotensive and septic on admission.    DVT prophylaxis: heparin Family Communication:none Status is: Inpatient  Remains inpatient appropriate because:Hemodynamically unstable   Dispo: The patient is from: Home              Anticipated d/c is to: SNF              Patient currently is not medically stable to d/c.   Difficult to place patient No        Code Status:     Code Status Orders  (From admission, onward)         Start     Ordered   01/03/21 2051  Full code  Continuous        01/03/21 2052        Code Status History    This patient has a current code status but no historical code status.   Advance Care Planning Activity        IV Access:    Peripheral IV   Procedures and diagnostic studies:   DG Orthopantogram  Result Date: 01/07/2021 CLINICAL DATA:  Endocarditis EXAM: ORTHOPANTOGRAM/PANORAMIC COMPARISON:  None.  FINDINGS: The mandible is intact. The maxilla is intact. There is numerous absent dentition involving the maxillary and mandibular molars and the majority of a maxillary and mandibular premolars.z dental caries are identified within tooth 8, the residual fragment of tooth 13, tooth 24, and tooth 25. A lucent lesion is seen within the maxilla within the defect related to the absent  tooth 10 demonstrating relatively poorly circumscribed margins and no significant matrix which may represent a residual periapical abscess. IMPRESSION: Multiple dental caries within the residual dentition as described above. Lucency within the maxilla within the defect related to the absent tooth 10 which may represent a a residual periapical abscess. Electronically Signed   By: Donald Salisbury MD   On: 01/07/2021 11:20   ECHOCARDIOGRAM COMPLETE  Result Date: 01/06/2021    ECHOCARDIOGRAM REPORT   Patient Name:   Donald Harrington Date of Exam: 01/05/2021 Medical Rec #:  [id number]    Height:       70.5 in Accession #:    [id number]   Weight:       165.0 lb Date of Birth:  [birth year]    BSA:          1.934 m Patient Age:    48 years     BP:           109/68 mmHg Patient Gender: M            HR:           110 bpm. Exam Location:  Inpatient Procedure: 2D Echo Indications:    Stroke  History:        Patient has no prior history of Echocardiogram examinations.                 Aortic and mitral replacement in 2015 at [facility] due                 to endocarditis; Aortic Valve Disease, Mitral Valve Disease and                 Endocarditis.  Sonographer:    Merrie Roof RDCS Referring Phys: [place]  1. Poor acoustic windows. Cannot exclude vegetations. Would recomm TEE to further evaluate if clinically indicated.  2. Left ventricular ejection fraction, by estimation, is 55 to 60%. The left ventricle has normal function. The left ventricle has no regional wall motion abnormalities. Indeterminate diastolic filling due to E-A fusion.  3. Right ventricular systolic function is normal. The right ventricular size is normal.  4. Left atrial size was mild to moderately dilated.  5. MV prosthesis (Epic bioprosthesis, 05/01/14) Not well visualized. Peak and mean gradients through the valveare 19 and 9 mm Hx respectively. Compared to echo report from 06/05/19 [facility]) mild increase in mean gradient (6 to 9  mm). The mitral valve has been repaired/replaced. Trivial mitral valve regurgitation.  6. AV prosthesis (23 mm Hancock bioprosthesis, 05/01/14) present Not well visualized. Peak and mean gradients through the valve are 51 and 30 mm Hg respectively Comparee to reprot for 8/117/20 [facility]), mean graidnet is increased. . The aortic valve has been repaired/replaced. Aortic valve regurgitation is trivial.  7. The inferior vena cava is normal in size with greater than 50% respiratory variability, suggesting right atrial pressure of 3 mmHg. FINDINGS  Left Ventricle: Left ventricular ejection fraction, by estimation, is 55 to 60%. The left ventricle has normal function. The left ventricle has no regional wall motion abnormalities. The left ventricular internal  cavity size was normal in size. There is  no left ventricular hypertrophy. Indeterminate diastolic filling due to E-A fusion. Right Ventricle: The right ventricular size is normal. Right vetricular wall thickness was not assessed. Right ventricular systolic function is normal. Left Atrium: Left atrial size was mild to moderately dilated. Right Atrium: Right atrial size was normal in size. Pericardium: There is no evidence of pericardial effusion. Mitral Valve: MV prosthesis (Epic bioprosthesis, 05/01/14) Not well visualized peak and mean gradients through the valveare 19 and 9 mm Hx respectively. Compared to echo reprot for 06/05/19 [facility]) mild increase in mean gradient (6 to 9 mm). The mitral valve has been repaired/replaced. Trivial mitral valve regurgitation. MV peak gradient, 18.5 mmHg. The mean mitral valve gradient is 9.0 mmHg. Tricuspid Valve: The tricuspid valve is grossly normal. Tricuspid valve regurgitation is mild. Aortic Valve: AV prosthesis (23 mm Hancock bioprosthesis, 05/01/14) present Not well visualized. Peak and mean gradients through the valve are 51 and 30 mm Hg respectively Comparee to reprot for 8/117/20 [facility]), mean graidnet is increased. The aortic  valve has been repaired/replaced. Aortic valve regurgitation is trivial. Aortic valve mean gradient measures 30.0 mmHg. Aortic valve peak gradient measures 50.7 mmHg. Aortic valve area, by VTI measures 0.43 cm. Pulmonic Valve: The pulmonic valve was not well visualized. Pulmonic valve regurgitation is not visualized. Aorta: The aortic root is normal in size and structure. Venous: The inferior vena cava is normal in size with greater than 50% respiratory variability, suggesting right atrial pressure of 3 mmHg. IAS/Shunts: The interatrial septum was not assessed.  LEFT VENTRICLE PLAX 2D LVIDd:         5.10 cm  Diastology LVIDs:         3.70 cm  LV e' lateral: 10.30 cm/s LV PW:         0.80 cm LV IVS:        1.00 cm LVOT diam:     2.00 cm LV SV:         33 LV SV Index:   17 LVOT Area:     3.14 cm  RIGHT VENTRICLE          IVC RV Basal diam:  3.80 cm  IVC diam: 1.90 cm LEFT ATRIUM             Index       RIGHT ATRIUM           Index LA diam:        5.20 cm 2.69 cm/m  RA Area:     17.40 cm LA Vol (A2C):   64.0 ml 33.10 ml/m RA Volume:   45.50 ml  23.53 ml/m LA Vol (A4C):   95.3 ml 49.29 ml/m LA Biplane Vol: 82.6 ml 42.72 ml/m  AORTIC VALVE AV Area (Vmax):    0.67 cm AV Area (Vmean):   0.50 cm AV Area (VTI):     0.43 cm AV Vmax:           356.00 cm/s AV Vmean:          262.000 cm/s AV VTI:            0.752 m AV Peak Grad:      50.7 mmHg AV Mean Grad:      30.0 mmHg LVOT Vmax:         75.50 cm/s LVOT Vmean:        42.000 cm/s LVOT VTI:          0.104 m LVOT/AV VTI  ratio: 0.14  AORTA Ao Root diam: 3.10 cm MITRAL VALVE             TRICUSPID VALVE MV Area VTI:  0.60 cm   TR Peak grad:   30.9 mmHg MV Peak grad: 18.5 mmHg  TR Vmax:        278.00 cm/s MV Mean grad: 9.0 mmHg MV Vmax:      2.15 m/s   SHUNTS MV Vmean:     139.0 cm/s Systemic VTI:  0.10 m                          Systemic Diam: 2.00 cm Dorris Carnes MD Electronically signed by Dorris Carnes MD Signature Date/Time: 01/06/2021/4:04:54 PM    Final      Medical  Consultants:    None.   Subjective:    [name] grouchy complaining he looks like a woman due to the underwear as he has.  Objective:    Vitals:   01/07/21 2315 01/07/21 2322 01/08/21 0311 01/08/21 0700  BP: 133/84 128/81 123/81 (!) 125/93  Pulse: 85 99 98 80  Resp: 16 18 18 16   Temp: 99.1 F (37.3 C) 98.8 F (37.1 C) 98.2 F (36.8 C) 98.4 F (36.9 C)  TempSrc: Axillary Oral Oral Oral  SpO2: 100% 94% 93% 100%  Weight:   79.8 kg   Height:       SpO2: 100 % O2 Flow Rate (L/min): 5 L/min FiO2 (%): 28 %   Intake/Output Summary (Last 24 hours) at 01/08/2021 0814 Last data filed at 01/08/2021 0545 Gross per 24 hour  Intake 1863.91 ml  Output 2000 ml  Net -136.09 ml   Filed Weights   01/05/21 0309 01/05/21 0912 01/08/21 0311  Weight: 78.9 kg 74.8 kg 79.8 kg    Exam: General exam: In no acute distress. Respiratory system: Good air movement and clear to auscultation. Cardiovascular system: S1 & S2 heard, RRR. No JVD. Gastrointestinal system: Abdomen is nondistended, soft and nontender.  Extremities: No pedal edema. Skin: No rashes, lesions or ulcers  Data Reviewed:    Labs: Basic Metabolic Panel: Recent Labs  Lab 01/04/21 0322 01/05/21 0320 01/06/21 0410 01/07/21 0323 01/08/21 0640  NA 137 137 135 139 138  K 4.5 3.6 3.6 4.0 4.2  CL 103 105 105 109 107  CO2 24 21* 23 23 27   GLUCOSE 97 90 96 103* 92  BUN 15 15 15 12 10   CREATININE 1.29* 1.11 1.06 1.01 0.93  CALCIUM 8.1* 7.8* 7.7* 7.7* 8.0*  MG  --  2.0  --   --   --   PHOS  --  3.0  --   --   --    GFR Estimated Creatinine Clearance: 82 mL/min (by C-G formula based on SCr of 0.93 mg/dL). Liver Function Tests: Recent Labs  Lab 01/04/21 0322 01/05/21 0320 01/07/21 0323 01/08/21 0640  AST 17 14* 12* 12*  ALT 17 14 11 9   ALKPHOS 42 38 39 38  BILITOT 1.2 0.9 0.4 0.6  PROT 5.2* 4.9* 4.8* 4.9*  ALBUMIN 2.4* 2.3* 2.0* 2.1*   No results for input(s): LIPASE, AMYLASE in the last 168 hours. No  results for input(s): AMMONIA in the last 168 hours. Coagulation profile Recent Labs  Lab 01/05/21 0320  INR 1.5*   COVID-19 Labs  No results for input(s): DDIMER, FERRITIN, LDH, CRP in the last 72 hours.  Lab Results  Component Value Date   SARSCOV2NAA  NEGATIVE 01/05/2021    CBC: Recent Labs  Lab 01/04/21 0322 01/04/21 1511 01/05/21 0320 01/06/21 0410 01/07/21 0323 01/08/21 0640  WBC 3.5*  --  4.1 3.3* 3.7* 4.0  HGB 7.0* 7.8* 7.6* 8.2* 8.8* 9.1*  HCT 21.4* 24.0* 23.2* 25.7* 26.7* 27.4*  MCV 105.9*  --  102.2* 102.0* 101.1* 101.1*  PLT 125*  --  124* 120* 125* 145*   Cardiac Enzymes: No results for input(s): CKTOTAL, CKMB, CKMBINDEX, TROPONINI in the last 168 hours. BNP (last 3 results) No results for input(s): PROBNP in the last 8760 hours. CBG: No results for input(s): GLUCAP in the last 168 hours. D-Dimer: No results for input(s): DDIMER in the last 72 hours. Hgb A1c: No results for input(s): HGBA1C in the last 72 hours. Lipid Profile: No results for input(s): CHOL, HDL, LDLCALC, TRIG, CHOLHDL, LDLDIRECT in the last 72 hours. Thyroid function studies: No results for input(s): TSH, T4TOTAL, T3FREE, THYROIDAB in the last 72 hours.  Invalid input(s): FREET3 Anemia work up: No results for input(s): VITAMINB12, FOLATE, FERRITIN, TIBC, IRON, RETICCTPCT in the last 72 hours. Sepsis Labs: Recent Labs  Lab 01/05/21 0320 01/06/21 0410 01/07/21 0323 01/08/21 0640  WBC 4.1 3.3* 3.7* 4.0   Microbiology Recent Results (from the past 240 hour(s))  Culture, blood (Routine X 2) w Reflex to ID Panel     Status: Abnormal   Collection Time: 01/04/21  3:04 PM   Specimen: BLOOD LEFT HAND  Result Value Ref Range Status   Specimen Description BLOOD LEFT HAND  Final   Special Requests   Final    BOTTLES DRAWN AEROBIC AND ANAEROBIC Blood Culture results may not be optimal due to an inadequate volume of blood received in culture bottles   Culture  Setup Time   Final     GRAM POSITIVE COCCI IN CHAINS IN BOTH AEROBIC AND ANAEROBIC BOTTLES CRITICAL RESULT CALLED TO, READ BACK BY AND VERIFIED WITH: J,MILLEN PHARMD @0851  01/05/21 EB Performed at [facility], [place] [address], [place], [place] [address]    Culture STREPTOCOCCUS GORDONII (A)  Final   Report Status 01/07/2021 FINAL  Final   Organism ID, Bacteria STREPTOCOCCUS GORDONII  Final      Susceptibility   Streptococcus gordonii - MIC*    PENICILLIN <=0.06 SENSITIVE Sensitive     CEFTRIAXONE <=0.12 SENSITIVE Sensitive     ERYTHROMYCIN 2 RESISTANT Resistant     LEVOFLOXACIN 1 SENSITIVE Sensitive     VANCOMYCIN 0.5 SENSITIVE Sensitive     * STREPTOCOCCUS GORDONII  Blood Culture ID Panel (Reflexed)     Status: Abnormal   Collection Time: 01/04/21  3:04 PM  Result Value Ref Range Status   Enterococcus faecalis NOT DETECTED NOT DETECTED Final   Enterococcus Faecium NOT DETECTED NOT DETECTED Final   Listeria monocytogenes NOT DETECTED NOT DETECTED Final   Staphylococcus species NOT DETECTED NOT DETECTED Final   Staphylococcus aureus (BCID) NOT DETECTED NOT DETECTED Final   Staphylococcus epidermidis NOT DETECTED NOT DETECTED Final   Staphylococcus lugdunensis NOT DETECTED NOT DETECTED Final   Streptococcus species DETECTED (A) NOT DETECTED Final    Comment: Not Enterococcus species, Streptococcus agalactiae, Streptococcus pyogenes, or Streptococcus pneumoniae. CRITICAL RESULT CALLED TO, READ BACK BY AND VERIFIED WITH: J,MILLEN PHARMD @0851  01/05/21 EB    Streptococcus agalactiae NOT DETECTED NOT DETECTED Final   Streptococcus pneumoniae NOT DETECTED NOT DETECTED Final   Streptococcus pyogenes NOT DETECTED NOT DETECTED Final   A.calcoaceticus-baumannii NOT DETECTED NOT DETECTED Final   Bacteroides  fragilis NOT DETECTED NOT DETECTED Final   Enterobacterales NOT DETECTED NOT DETECTED Final   Enterobacter cloacae complex NOT DETECTED NOT DETECTED Final   Escherichia coli NOT DETECTED NOT DETECTED  Final   Klebsiella aerogenes NOT DETECTED NOT DETECTED Final   Klebsiella oxytoca NOT DETECTED NOT DETECTED Final   Klebsiella pneumoniae NOT DETECTED NOT DETECTED Final   Proteus species NOT DETECTED NOT DETECTED Final   Salmonella species NOT DETECTED NOT DETECTED Final   Serratia marcescens NOT DETECTED NOT DETECTED Final   Haemophilus influenzae NOT DETECTED NOT DETECTED Final   Neisseria meningitidis NOT DETECTED NOT DETECTED Final   Pseudomonas aeruginosa NOT DETECTED NOT DETECTED Final   Stenotrophomonas maltophilia NOT DETECTED NOT DETECTED Final   Candida albicans NOT DETECTED NOT DETECTED Final   Candida auris NOT DETECTED NOT DETECTED Final   Candida glabrata NOT DETECTED NOT DETECTED Final   Candida krusei NOT DETECTED NOT DETECTED Final   Candida parapsilosis NOT DETECTED NOT DETECTED Final   Candida tropicalis NOT DETECTED NOT DETECTED Final   Cryptococcus neoformans/gattii NOT DETECTED NOT DETECTED Final    Comment: Performed at [facility], [place] [address] [place], [place] [address]  Culture, blood (Routine X 2) w Reflex to ID Panel     Status: Abnormal   Collection Time: 01/04/21  3:11 PM   Specimen: BLOOD LEFT HAND  Result Value Ref Range Status   Specimen Description BLOOD LEFT HAND  Final   Special Requests   Final    BOTTLES DRAWN AEROBIC ONLY Blood Culture adequate volume   Culture  Setup Time   Final    GRAM POSITIVE COCCI IN CHAINS AEROBIC BOTTLE ONLY CRITICAL VALUE NOTED.  VALUE IS CONSISTENT WITH PREVIOUSLY REPORTED AND CALLED VALUE.    Culture (A)  Final    STREPTOCOCCUS GORDONII SUSCEPTIBILITIES PERFORMED ON PREVIOUS CULTURE WITHIN THE LAST 5 DAYS. Performed at [facility], [place] [address], [place], [place] [address]    Report Status 01/07/2021 FINAL  Final  Resp Panel by RT-PCR (Flu A&B, Covid) Nasopharyngeal Swab     Status: None   Collection Time: 01/05/21  6:31 AM   Specimen: Nasopharyngeal Swab; Nasopharyngeal(NP) swabs in vial  transport medium  Result Value Ref Range Status   SARS Coronavirus 2 by RT PCR NEGATIVE NEGATIVE Final    Comment: (NOTE) SARS-CoV-2 target nucleic acids are NOT DETECTED.  The SARS-CoV-2 RNA is generally detectable in upper respiratory specimens during the acute phase of infection. The lowest concentration of SARS-CoV-2 viral copies this assay can detect is 138 copies/mL. A negative result does not preclude SARS-Cov-2 infection and should not be used as the sole basis for treatment or other patient management decisions. A negative result may occur with  improper specimen collection/handling, submission of specimen other than nasopharyngeal swab, presence of viral mutation(s) within the areas targeted by this assay, and inadequate number of viral copies(<138 copies/mL). A negative result must be combined with clinical observations, patient history, and epidemiological information. The expected result is Negative.  Fact Sheet for Patients:  EntrepreneurPulse.com.au  Fact Sheet for Healthcare Providers:  IncredibleEmployment.be  This test is no t yet approved or cleared by the [place] FDA and  has been authorized for detection and/or diagnosis of SARS-CoV-2 by FDA under an Emergency Use Authorization (EUA). This EUA will remain  in effect (meaning this test can be used) for the duration of the COVID-19 declaration under Section 564(b)(1) of the Act, 21 U.S.C.section 360bbb-3(b)(1), unless the authorization  is terminated  or revoked sooner.       Influenza A by PCR NEGATIVE NEGATIVE Final   Influenza B by PCR NEGATIVE NEGATIVE Final    Comment: (NOTE) The Xpert Xpress SARS-CoV-2/FLU/RSV plus assay is intended as an aid in the diagnosis of influenza from Nasopharyngeal swab specimens and should not be used as a sole basis for treatment. Nasal washings and aspirates are unacceptable for Xpert Xpress SARS-CoV-2/FLU/RSV testing.  Fact  Sheet for Patients: EntrepreneurPulse.com.au  Fact Sheet for Healthcare Providers: IncredibleEmployment.be  This test is not yet approved or cleared by the [place] FDA and has been authorized for detection and/or diagnosis of SARS-CoV-2 by FDA under an Emergency Use Authorization (EUA). This EUA will remain in effect (meaning this test can be used) for the duration of the COVID-19 declaration under Section 564(b)(1) of the Act, 21 U.S.C. section 360bbb-3(b)(1), unless the authorization is terminated or revoked.  Performed at [facility], [place] [address] [place], [place] [address]   Culture, blood (Routine X 2) w Reflex to ID Panel     Status: None (Preliminary result)   Collection Time: 01/07/21 11:24 AM   Specimen: BLOOD RIGHT FOREARM  Result Value Ref Range Status   Specimen Description BLOOD RIGHT FOREARM  Final   Special Requests   Final    BOTTLES DRAWN AEROBIC AND ANAEROBIC Blood Culture results may not be optimal due to an inadequate volume of blood received in culture bottles   Culture   Final    NO GROWTH < 24 HOURS Performed at [facility], [place] [address], [place], [place] [address]    Report Status PENDING  Incomplete  Culture, blood (Routine X 2) w Reflex to ID Panel     Status: None (Preliminary result)   Collection Time: 01/07/21 11:29 AM   Specimen: BLOOD RIGHT HAND  Result Value Ref Range Status   Specimen Description BLOOD RIGHT HAND  Final   Special Requests   Final    BOTTLES DRAWN AEROBIC AND ANAEROBIC Blood Culture results may not be optimal due to an inadequate volume of blood received in culture bottles   Culture   Final    NO GROWTH < 24 HOURS Performed at [facility], [place] [address], [place], [place] [address]    Report Status PENDING  Incomplete     Medications:   . sodium chloride   Intravenous Once  . melatonin  5 mg Oral QHS  . metoprolol tartrate  25 mg Oral BID  . pantoprazole   40 mg Intravenous Q12H   Continuous Infusions: . sodium chloride 50 mL/hr at 01/07/21 2114  . gentamicin 200 mg (01/07/21 2301)  . heparin 1,650 Units/hr (01/08/21 0755)  . penicillin g continuous IV infusion 12 Million Units (01/08/21 0543)      LOS: 5 days   [name]  [facility]  01/08/2021, 8:14 AM

## 2021-01-08 NOTE — Progress Notes (Signed)
ANTICOAGULATION CONSULT NOTE - Follow-Up Consult  Pharmacy Consult for Heparin Indication: atrial fibrillation and stroke  No Known Allergies  Patient Measurements: Height: 5' 10.5" (179.1 cm) Weight: 79.8 kg (175 lb 14.8 oz) IBW/kg (Calculated) : 74.15 Heparin Dosing Weight: 74 kg  Vital Signs: Temp: 98.2 F (36.8 C) (03/23 1518) Temp Source: Oral (03/23 1518) BP: 98/66 (03/23 1518) Pulse Rate: 87 (03/23 1518)  Labs: Recent Labs    01/06/21 0410 01/06/21 1842 01/07/21 0323 01/07/21 1125 01/07/21 2150 01/08/21 0640 01/08/21 1920  HGB 8.2*  --  8.8*  --   --  9.1*  --   HCT 25.7*  --  26.7*  --   --  27.4*  --   PLT 120*  --  125*  --   --  145*  --   HEPARINUNFRC  --    < > <0.10*   < > <0.10* 0.15* 0.10*  CREATININE 1.06  --  1.01  --   --  0.93  --    < > = values in this interval not displayed.    Estimated Creatinine Clearance: 82 mL/min (by C-G formula based on SCr of 0.93 mg/dL).  Assessment: 41 YOM on warfarin PTA for hx Afib, prior CVA who presented on 3/18 with new strokes on MRI, concern for GIB. The patient is s/p EGD on 3/20 which showed erosive gastropathy and non-bleeding duodenal ulcer. GI okayed restart of anticoagulation. Pharmacy consulted to start Heparin for bridging - no bolus and low goal with CVA + GIB eval. The MD does not plan to resume Warfarin at this time as thinks the patient would likely benefit from transition to Apixaban.   Heparin level this morning remains SUBherapeutic but now detectable after multiple rate increases.  L-AC IV site checked by RN yesterday - some puffiness noted, RN stated not drawing back blood well. Switched Heparin to alternative IV site on R-arm.   CBC stable with Hgb 8-9's, plts 145 - will continue to monitor. No bleeding noted per discussion with RN.  PM: heparin level SUBtherapeutic at 0.1 on 1650 units/hr. Spoke with RN who noted minimal amount of blood around new IV site. Also stated heparin was infusing  through old IV site and other IV medications were moved to the new IV site. Asked RN to move heparin to new IV site as concerned the old site was not working well. Will increase heparin rate conservatively.   Goal of Therapy:  Heparin level 0.3-0.5 units/ml Monitor platelets by anticoagulation protocol: Yes   Plan:  - Increase Heparin to 1750 units/hr  - Heparin level with am labs - No warfarin restart for now - MD evaluating transition to Apixaban - Will continue to monitor for any signs/symptoms of bleeding Thank you for involving pharmacy in this patient's care.  Renold Genta, PharmD, BCPS Clinical Pharmacist Clinical phone for 01/08/2021 until 10p is [phone] 01/08/2021 8:17 PM  **Pharmacist phone directory can be found on [email or web address] listed under [email or web address]**

## 2021-01-09 DIAGNOSIS — K253 Acute gastric ulcer without hemorrhage or perforation: Secondary | ICD-10-CM | POA: Diagnosis not present

## 2021-01-09 DIAGNOSIS — I059 Rheumatic mitral valve disease, unspecified: Secondary | ICD-10-CM | POA: Diagnosis not present

## 2021-01-09 DIAGNOSIS — I480 Paroxysmal atrial fibrillation: Secondary | ICD-10-CM | POA: Diagnosis not present

## 2021-01-09 DIAGNOSIS — I639 Cerebral infarction, unspecified: Secondary | ICD-10-CM | POA: Diagnosis not present

## 2021-01-09 DIAGNOSIS — R7881 Bacteremia: Secondary | ICD-10-CM | POA: Diagnosis not present

## 2021-01-09 DIAGNOSIS — D62 Acute posthemorrhagic anemia: Secondary | ICD-10-CM | POA: Diagnosis not present

## 2021-01-09 DIAGNOSIS — K269 Duodenal ulcer, unspecified as acute or chronic, without hemorrhage or perforation: Secondary | ICD-10-CM | POA: Diagnosis not present

## 2021-01-09 LAB — HEPARIN LEVEL (UNFRACTIONATED)
Heparin Unfractionated: 0.16 IU/mL — ABNORMAL LOW (ref 0.30–0.70)
Heparin Unfractionated: 0.18 IU/mL — ABNORMAL LOW (ref 0.30–0.70)
Heparin Unfractionated: 0.24 IU/mL — ABNORMAL LOW (ref 0.30–0.70)

## 2021-01-09 LAB — GENTAMICIN LEVEL, TROUGH: Gentamicin Trough: <0.5 ug/mL — ABNORMAL LOW (ref 0.5–2.0)

## 2021-01-09 NOTE — Plan of Care (Signed)
  Problem: Education: Goal: Knowledge of secondary prevention will improve 01/09/2021 1500 by Asencion Partridge, RN Outcome: Progressing 01/09/2021 1500 by Asencion Partridge, RN Outcome: Progressing Goal: Knowledge of patient specific risk factors addressed and post discharge goals established will improve 01/09/2021 1500 by Asencion Partridge, RN Outcome: Progressing 01/09/2021 1500 by Asencion Partridge, RN Outcome: Progressing

## 2021-01-09 NOTE — Progress Notes (Signed)
ANTICOAGULATION CONSULT NOTE - Follow-Up Consult  Pharmacy Consult for Heparin Indication: atrial fibrillation and stroke  No Known Allergies  Patient Measurements: Height: 5' 10.5" (179.1 cm) Weight: 79.8 kg (175 lb 14.8 oz) IBW/kg (Calculated) : 74.15 Heparin Dosing Weight: 74 kg  Vital Signs: Temp: (P) 98.1 F (36.7 C) (03/24 0758) Temp Source: (P) Oral (03/24 0758) BP: (P) 130/74 (03/24 0758) Pulse Rate: (P) 66 (03/24 0758)  Labs: Recent Labs    01/07/21 0323 01/07/21 1125 01/08/21 0640 01/08/21 1920 01/09/21 0406  HGB 8.8*  --  9.1*  --   --   HCT 26.7*  --  27.4*  --   --   PLT 125*  --  145*  --   --   HEPARINUNFRC <0.10*   < > 0.15* 0.10* 0.16*  CREATININE 1.01  --  0.93  --   --    < > = values in this interval not displayed.    Estimated Creatinine Clearance: 82 mL/min (by C-G formula based on SCr of 0.93 mg/dL).  Assessment: 27 YOM on warfarin PTA for hx Afib, prior CVA who presented on 3/18 with new strokes on MRI, concern for GIB. The patient is s/p EGD on 3/20 which showed erosive gastropathy and non-bleeding duodenal ulcer. GI okayed restart of anticoagulation. Pharmacy consulted to start Heparin for bridging - no bolus and low goal with CVA + GIB eval. The MD does not plan to resume Warfarin at this time as thinks the patient would likely benefit from transition to Apixaban.   Earlier this week the L-AC IV site was reviewed with the RN and was noted to have some puffiness and was not drawing back blood well. Switched Heparin to alternative IV site on R-arm above R-AC.  Heparin level this afternoon remains SUBtherapeutic despite multiple rate increases (HL 0.18 << 0.16, goal of 0.3-0.5). Rechecked IV site with RN - Heparin now running in dedicated RUA IV-site. RN reports flushing well and drawing back without issues. No interruptions in the heparin drip or bleeding noted.  Goal of Therapy:  Heparin level 0.3-0.5 units/ml Monitor platelets by  anticoagulation protocol: Yes   Plan:  - Increase Heparin to 2050 units/hr - No warfarin restart for now - MD evaluating transition to Apixaban -Will continue to monitor for any signs/symptoms of bleeding and will follow up with heparin level in 6 hours   Thank you for allowing pharmacy to be a part of this patient's care.  Alycia Rossetti, PharmD, BCPS Clinical Pharmacist Clinical phone for 01/09/2021: [phone] 01/09/2021 3:53 PM   **Pharmacist phone directory can now be found on [email or web address] ([email or web address]).  Listed under [email or web address].

## 2021-01-09 NOTE — Progress Notes (Signed)
Inpatient Rehab Admissions:  Inpatient Rehab Consult received.  I met with patient at the bedside for rehabilitation assessment and to discuss goals and expectations of an inpatient rehab admission.  We discussed that goals would be mod I, and ELOS would be about a week (but could vary based on pt progress).  He does demonstrate some poor awareness of deficits, and is tangential, requiring cues to attend to task, but I believe that for basic mobility and ADLs he could perform at a mod I level with a short rehab stay.  He would not have any support at discharge.  We discussed need for insurance authorization and that pt should continue to improve while we complete that process. He is currently agreeable to submitting for insurance authorization (will start process today).  I let him know that if, at any time, he feels that he does not want to pursue CIR, we are certainly able to withdraw insurance request.  Will continue to follow.    Signed: Shann Medal, PT, DPT Admissions Coordinator [phone] 01/09/21  12:57 PM

## 2021-01-09 NOTE — Progress Notes (Signed)
Physical Therapy Treatment Patient Details Name: Donald Harrington MRN: [id number] DOB: Jul 12, 1956 Today's Date: 01/09/2021    History of Present Illness 65 y.o. male presenting from outpatient MRI showing multifocal acute embolic strokes presents (subcentimeter bilateral cerebral and cerebellar infarcts). Also presenting with acute anemia and dark stools concerning for a GI bleed. Chest x-ray impression of COPD and bibasilar atelectasis or infiltrates, new since prior study. 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    PT Comments    Patient continues to be agitated when he feels independence is challenged. Patient ambulated 300' with SPC and minA, x1 anterior LOB requiring assistance to recover. Patient refused exercises at end of session due to need for urinal and requested PT to leave. Continue to recommend comprehensive inpatient rehab (CIR) for post-acute therapy needs. If unable to go CIR, recommend SNF at d/c as patient is unsafe to return home alone and requires 24 hour supervision/assistance for safety.     Follow Up Recommendations  CIR;Supervision/Assistance - 24 hour     Equipment Recommendations  None recommended by PT    Recommendations for Other Services       Precautions / Restrictions Precautions Precautions: Fall Precaution Comments: HOH; HR < 130 (return to bed if >130) Restrictions Weight Bearing Restrictions: No    Mobility  Bed Mobility Overal bed mobility: Modified Independent                  Transfers Overall transfer level: Needs assistance Equipment used: Straight cane Transfers: Sit to/from Stand Sit to Stand: Min guard         General transfer comment: Min guard for steadying/safety.  Ambulation/Gait Ambulation/Gait assistance: Min assist Gait Distance (Feet): 300  Feet Assistive device: Straight cane Gait Pattern/deviations: Step-through pattern;Decreased stride length;Drifts right/left;Wide base of support Gait velocity: decreased   General Gait Details: Wide BOS throughout. x1 anterior LOB with minA to recover. Adamant about using SPC for ambulation   Stairs             Wheelchair Mobility    Modified Rankin (Stroke Patients Only) Modified Rankin (Stroke Patients Only) Pre-Morbid Rankin Score: No symptoms Modified Rankin: Moderately severe disability     Balance Overall balance assessment: Needs assistance Sitting-balance support: No upper extremity supported;Feet supported Sitting balance-Leahy Scale: Good     Standing balance support: Single extremity supported Standing balance-Leahy Scale: Poor Standing balance comment: Reliant on at least unilateral UE support with mobility.                            Cognition Arousal/Alertness: Awake/alert Behavior During Therapy: WFL for tasks assessed/performed Overall Cognitive Status: Impaired/Different from baseline Area of Impairment: Memory;Problem solving                     Memory: Decreased recall of precautions;Decreased short-term memory       Problem Solving: Slow processing;Requires verbal cues General Comments: Easily agitated and sudden mood changes. Likes to be in charge of session at times, but does take advice and cueing if it is direct and not in question form.      Exercises      General Comments General comments (skin integrity, edema, etc.): VSS      Pertinent Vitals/Pain Pain Assessment: No/denies pain    Home  Living                      Prior Function            PT Goals (current goals can now be found in the care plan section) Acute Rehab PT Goals Patient Stated Goal: To return home independently. PT Goal Formulation: With patient Time For Goal Achievement: 01/19/21 Potential to Achieve Goals: Good Progress  towards PT goals: Progressing toward goals    Frequency    Min 4X/week      PT Plan Current plan remains appropriate    Co-evaluation              AM-PAC PT "6 Clicks" Mobility   Outcome Measure  Help needed turning from your back to your side while in a flat bed without using bedrails?: None Help needed moving from lying on your back to sitting on the side of a flat bed without using bedrails?: None Help needed moving to and from a bed to a chair (including a wheelchair)?: A Little Help needed standing up from a chair using your arms (e.g., wheelchair or bedside chair)?: A Little Help needed to walk in hospital room?: A Little Help needed climbing 3-5 steps with a railing? : A Little 6 Click Score: 20    End of Session Equipment Utilized During Treatment: Gait belt Activity Tolerance: Patient tolerated treatment well Patient left: in bed;with call bell/phone within reach;with bed alarm set Nurse Communication: Mobility status PT Visit Diagnosis: Unsteadiness on feet (R26.81);Other abnormalities of gait and mobility (R26.89);Muscle weakness (generalized) (M62.81);Difficulty in walking, not elsewhere classified (R26.2);Other symptoms and signs involving the nervous system (U88.916)     Time: 9450-3888 PT Time Calculation (min) (ACUTE ONLY): 21 min  Charges:  $Therapeutic Activity: 8-22 mins                     Sharhonda Atwood A. Gilford Rile PT, DPT Acute Rehabilitation Services Pager [phone] Office [phone]    Linna Hoff 01/09/2021, 10:52 AM

## 2021-01-09 NOTE — Plan of Care (Signed)
  Problem: Education: Goal: Knowledge of patient specific risk factors addressed and post discharge goals established will improve Outcome: Progressing   Problem: Education: Goal: Knowledge of secondary prevention will improve Outcome: Progressing

## 2021-01-09 NOTE — Progress Notes (Signed)
[facility] for Infectious Disease  Date of Admission:  01/03/2021      Total days of antibiotics 5  Penicillin G 3/22 >> current   Gentamicin 3/21 >> current            ASSESSMENT: Donald Harrington is a 65 y.o. male with GI bleed/anemia, headache with new acute bilateral embolic strokes, neck pain and bacteremia due to streptococcus gordonii. 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 This was negative for vegetations, negative for valve dysfunction. It is possible that the CVAs are due more to poor adherence to anticoagulation, however given strep in the blood hard to prove these do not represent septic emboli. Would favor to continue the PV endocarditis treatment plan with 2 weeks of gent and IV Penicillin. No need for CT surgery to see as there is no surgical indications at this point on TEE.   Dentistry to see for extractions. With ongoing neck pain/discomfort will repeat MRI of c-spine to ensure there is no discitis here. 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   Will follow repeat blood cultures for timing of PICC line. We discussed prolonged antibiotics today with him, although he needs reinforcement.   Looks like PT recommending CIR - not clear he will agree with that recommendation.     PLAN: 1. Continue IV penicillin + gentamicin (2 weeks)  2. Repeat C spine MRI w/ contrast to ensure no discitis/abscess given bacteremia.  3. Dentistry to see  4. Hold on PICC for now    Principal Problem:   Bacteremia due to Streptococcus Active Problems:   Acute embolic stroke (HCC)   GI bleed   Acute blood loss anemia   Cough   PAF (paroxysmal atrial fibrillation) (HCC)   History of aortic valve replacement   History of mitral valve replacement   Duodenal ulcer   Acute gastric erosion   . sodium chloride   Intravenous Once  .  melatonin  5 mg Oral QHS  . metoprolol tartrate  25 mg Oral BID  . pantoprazole  40 mg Intravenous Q12H    SUBJECTIVE: "My neck still hurts - does not seem to be of anyone's concern." Tylenol isn't very helpful.  Dentist to see him today or tomorrow, which he accepts.  Mentions a male that checks in on him but does not feel like he needs any caregiver/support for current illness. Has lived in the same apartment for 15 years alone.    Review of Systems: Review of Systems  Constitutional: Negative for chills, fever, malaise/fatigue and weight loss.  Eyes: Negative for blurred vision, photophobia and pain.  Respiratory: Negative for cough and sputum production.   Cardiovascular: Negative for chest pain and leg swelling.  Gastrointestinal: Negative for abdominal pain, diarrhea and vomiting.  Genitourinary: Negative for dysuria and flank pain.  Musculoskeletal: Positive for neck pain. Negative for joint pain and myalgias.  Skin: Negative for rash.  Neurological: Negative for dizziness, tingling, focal weakness and headaches.  Psychiatric/Behavioral: Negative for depression and substance abuse. The patient is not nervous/anxious and does not have insomnia.     No Known Allergies   OBJECTIVE: Vitals:   01/08/21 2339 01/09/21 0348 01/09/21 0758 01/09/21 1202  BP: 125/71 131/72 130/74 120/85  Pulse: 78 76 66 64  Resp: 19 18 18 14   Temp: 97.8 F (36.6 C) 98.9 F (37.2 C)  98.1 F (36.7 C) 97.8 F (36.6 C)  TempSrc: Oral Oral Oral Oral  SpO2: 100% 95%    Weight:      Height:       Body mass index is 24.89 kg/m.  Physical Exam Vitals reviewed.  Constitutional:      Appearance: Normal appearance. He is not ill-appearing.  HENT:     Mouth/Throat:     Comments: Poor dentition Eyes:     General: No scleral icterus.    Pupils: Pupils are equal, round, and reactive to light.  Neck:     Comments: Limited lateral rotation of neck. Tenderness +  Cardiovascular:     Rate and  Rhythm: Normal rate and regular rhythm.     Heart sounds: No murmur heard.   Pulmonary:     Effort: Pulmonary effort is normal.     Breath sounds: Normal breath sounds.  Abdominal:     General: Bowel sounds are normal.     Palpations: Abdomen is soft.  Musculoskeletal:        General: Normal range of motion.  Skin:    General: Skin is warm and dry.     Capillary Refill: Capillary refill takes less than 2 seconds.     Comments: ecchymosis along arms.   Neurological:     Mental Status: He is alert and oriented to person, place, and time.     Lab Results Lab Results  Component Value Date   WBC 4.0 01/08/2021   HGB 9.1 (L) 01/08/2021   HCT 27.4 (L) 01/08/2021   MCV 101.1 (H) 01/08/2021   PLT 145 (L) 01/08/2021    Lab Results  Component Value Date   CREATININE 0.93 01/08/2021   BUN 10 01/08/2021   NA 138 01/08/2021   K 4.2 01/08/2021   CL 107 01/08/2021   CO2 27 01/08/2021    Lab Results  Component Value Date   ALT 9 01/08/2021   AST 12 (L) 01/08/2021   ALKPHOS 38 01/08/2021   BILITOT 0.6 01/08/2021     Microbiology: BCx 3/19 >> Strep gordonii 3/4 bottles (PCN MIC < 0.06) BCx 3/22 >. Neg, prelim     Janene Madeira, MSN, NP-C [facility] for Infectious Disease [email or web address] Pager: [phone] Office: [phone] [place]: [phone]

## 2021-01-09 NOTE — Progress Notes (Signed)
[facility] PROGRESS NOTE    Progress Note  Donald Harrington  VOJ:[id number] DOB: 28-Jun-1956 DOA: 01/03/2021 PCP: Patient, No Pcp Per     Brief Narrative:   Donald Harrington is an 65 y.o. 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  Reports no abdominal Harrington nausea or vomiting black stools or red stools.  He is found to be tachycardic blood cultures were sent which were positive for Streptococcus ID was consulted  Significant events: EGD on 01/06/2019 due to shortness of gastritis, duodenal ulcer but no evidence of bleeding. Status post 2 units packed red blood cells 01/08/2019 orthopantogram multiple dental caries with residual dentition lucency within the maxilla related to absent tooth 10 which may represent an residual periapical abscess. 01/06/2021 2D echocardiogram showed poor acoustic window EF of 55 MV bioprosthetic valve not well visualized with a mean gradient of 9 mmHg.  AV bioprosthetic valve not well visualized with a mean gradient of 30 mmHg  Antibiotics: 3.21.2022 rocephin 3.21.2022 Gentamacin  3.22.2022 PCN  Microbiology data: 01/04/2021 blood culture: Streptococcus Gondi 01/07/2021 blood cultures negative till date.  Procedures: None  Assessment/Plan:   Acute blood loss anemia/upper GI bleed: He reports taking Goody powder for neck Harrington. Status post 2 units of packed red blood cells hemoglobin has improved.  No signs of further bleeding. Hemoglobin has remained stable.  Acute multifocal embolic strokes: Likely in the setting of subtherapeutic INR, history of A. fib versus septic emboli/given bacteremia. Continue heparin for now.  Sepsis due to Streptococcus  bacteremia/concern for endocarditis/in the setting of periodontal disease: Sepsis physiology on 01/05/2021, now resolved. Blood cultures grew streptococcal Gondi, 2D echo was done that showed no vegetation. Surveillance Blood cultures have remained negative till date. TEE scheduled for 01/08/2021 showed no vegetation. Orthopantogram on admission showed possible abscess, appreciate dental consultation.  Paroxysmal atrial fibrillation: Continue heparin, currently in sinus rhythm.  Essential hypertension: Holding antihypertensive medication as he was hypotensive and septic on admission.    DVT prophylaxis: heparin Family Communication:none Status is: Inpatient  Remains inpatient appropriate because:Hemodynamically unstable   Dispo: The patient is from: Home              Anticipated d/c is to: SNF              Patient currently is not medically stable to d/c.   Difficult to place patient No    Code Status:     Code Status Orders  (From admission, onward)         Start     Ordered   01/03/21 2051  Full code  Continuous        01/03/21 2052        Code Status History    This patient has a current code status but no historical code status.   Advance Care Planning Activity        IV Access:    Peripheral IV   Procedures and diagnostic studies:   DG Orthopantogram  Result Date: 01/07/2021 CLINICAL DATA:  Endocarditis EXAM: ORTHOPANTOGRAM/PANORAMIC COMPARISON:  None. FINDINGS: The mandible is intact. The maxilla is intact. There is numerous absent dentition involving the maxillary and mandibular molars and the majority of a maxillary and  mandibular premolars.z dental caries are identified within tooth 8, the residual fragment of tooth 13, tooth 24, and tooth 25. A lucent lesion is seen within the maxilla within the defect related to the absent tooth 10 demonstrating relatively poorly circumscribed margins and no significant matrix which may represent a residual  periapical abscess. IMPRESSION: Multiple dental caries within the residual dentition as described above. Lucency within the maxilla within the defect related to the absent tooth 10 which may represent a a residual periapical abscess. Electronically Signed   By: [name]   On: 01/07/2021 11:20   ECHO TEE  Result Date: 01/08/2021    TRANSESOPHOGEAL ECHO REPORT   Patient Name:   Donald Harrington Date of Exam: 01/08/2021 Medical Rec #:  [id number]    Height:       70.5 in Accession #:    [id number]   Weight:       175.9 lb Date of Birth:  1956-05-15    BSA:          1.987 m Patient Age:    31 years     BP:           86/59 mmHg Patient Gender: M            HR:           91 bpm. Exam Location:  Inpatient Procedure: Transesophageal Echo, Cardiac Doppler and Color Doppler Indications:     Bacteremia  History:         Patient has prior history of Echocardiogram examinations, most                  recent 01/06/2021. Previous Myocardial Infarction, Stroke,                  Endocarditis and AVR. MVR, Arrythmias:Atrial Fibrillation,                  Signs/Symptoms:Bacteremia; Risk Factors:Hypertension.  Sonographer:     Dustin Flock Referring Phys:  [id number] Tami Lin [facility] Diagnosing Phys: Mertie Moores MD PROCEDURE: The transesophogeal probe was passed without difficulty through the esophogus of the patient. Sedation performed by performing physician. The patient was monitored while under deep sedation. Anesthestetic sedation was provided intravenously by  Anesthesiology: 233.59mg  of Propofol, 80mg  of Lidocaine. The patient developed no complications during the procedure. IMPRESSIONS  1. Left ventricular ejection fraction, by estimation, is 55 to 60%. The left ventricle has normal function.  2. Right ventricular systolic function is normal. The right ventricular size is normal.  3. No left atrial/left atrial appendage thrombus was detected.  4. The mitral valve has been repaired/replaced. No evidence of mitral  valve regurgitation. No evidence of mitral stenosis.  5. The tricuspid valve is abnormal. Tricuspid valve regurgitation is moderate.  6. The aortic valve has been repaired/replaced. Aortic valve regurgitation is not visualized. No aortic stenosis is present. FINDINGS  Left Ventricle: Left ventricular ejection fraction, by estimation, is 55 to 60%. The left ventricle has normal function. The left ventricular internal cavity size was normal in size. Right Ventricle: The right ventricular size is normal. No increase in right ventricular wall thickness. Right ventricular systolic function is normal. Left Atrium: Left atrial size was normal in size. No left atrial/left atrial appendage thrombus was detected. Right Atrium: Right atrial size was normal in size. Pericardium: There is no evidence of pericardial effusion. Mitral Valve: The mitral valve has been repaired/replaced. No evidence of mitral valve regurgitation. There is a bioprosthetic valve  present in the mitral position. No evidence of mitral valve stenosis. There is no evidence of mitral valve vegetation. Tricuspid Valve: The tricuspid valve is abnormal. Tricuspid valve regurgitation is moderate. Aortic Valve: The aortic valve has been repaired/replaced. Aortic valve regurgitation is not visualized. No aortic stenosis is present. There is a bioprosthetic valve present in the aortic position. There is no evidence of aortic valve vegetation. Pulmonic Valve: The pulmonic valve was grossly normal. Pulmonic valve regurgitation is trivial. Aorta: The aortic root and ascending aorta are structurally normal, with no evidence of dilitation. There is minimal (Grade I) plaque. IAS/Shunts: No atrial level shunt detected by color flow Doppler. Mertie Moores MD Electronically signed by Mertie Moores MD Signature Date/Time: 01/08/2021/3:13:00 PM    Final      Medical Consultants:    None.   Subjective:    Donald Harrington in a better mood this morning.  Objective:     Vitals:   01/08/21 2023 01/08/21 2339 01/09/21 0348 01/09/21 0758  BP: 97/86 125/71 131/72 (P) 130/74  Pulse: 95 78 76 (P) 66  Resp: 19 19 18  (P) 18  Temp: 98.9 F (37.2 C) 97.8 F (36.6 C) 98.9 F (37.2 C) (P) 98.1 F (36.7 C)  TempSrc: Oral Oral Oral (P) Oral  SpO2: 95% 100% 95%   Weight:      Height:       SpO2: 95 % O2 Flow Rate (L/min): 3 L/min FiO2 (%): 28 %   Intake/Output Summary (Last 24 hours) at 01/09/2021 0820 Last data filed at 01/09/2021 0653 Gross per 24 hour  Intake 2235.85 ml  Output 3325 ml  Net -1089.15 ml   Filed Weights   01/05/21 0309 01/05/21 0912 01/08/21 0311  Weight: 78.9 kg 74.8 kg 79.8 kg    Exam: General exam: In no acute distress. Respiratory system: Good air movement and clear to auscultation. Cardiovascular system: S1 & S2 heard, RRR. No JVD. Gastrointestinal system: Abdomen is nondistended, soft and nontender.  Extremities: No pedal edema. Skin: No rashes, lesions or ulcers Psychiatry: Judgement and insight appear normal. Mood & affect appropriate.  Data Reviewed:    Labs: Basic Metabolic Panel: Recent Labs  Lab 01/04/21 0322 01/05/21 0320 01/06/21 0410 01/07/21 0323 01/08/21 0640  NA 137 137 135 139 138  K 4.5 3.6 3.6 4.0 4.2  CL 103 105 105 109 107  CO2 24 21* 23 23 27   GLUCOSE 97 90 96 103* 92  BUN 15 15 15 12 10   CREATININE 1.29* 1.11 1.06 1.01 0.93  CALCIUM 8.1* 7.8* 7.7* 7.7* 8.0*  MG  --  2.0  --   --   --   PHOS  --  3.0  --   --   --    GFR Estimated Creatinine Clearance: 82 mL/min (by C-G formula based on SCr of 0.93 mg/dL). Liver Function Tests: Recent Labs  Lab 01/04/21 0322 01/05/21 0320 01/07/21 0323 01/08/21 0640  AST 17 14* 12* 12*  ALT 17 14 11 9   ALKPHOS 42 38 39 38  BILITOT 1.2 0.9 0.4 0.6  PROT 5.2* 4.9* 4.8* 4.9*  ALBUMIN 2.4* 2.3* 2.0* 2.1*   No results for input(s): LIPASE, AMYLASE in the last 168 hours. No results for input(s): AMMONIA in the last 168 hours. Coagulation  profile Recent Labs  Lab 01/05/21 0320  INR 1.5*   COVID-19 Labs  No results for input(s): DDIMER, FERRITIN, LDH, CRP in the last 72 hours.  Lab Results  Component Value Date  [place] NEGATIVE 01/05/2021    CBC: Recent Labs  Lab 01/04/21 0322 01/04/21 1511 01/05/21 0320 01/06/21 0410 01/07/21 0323 01/08/21 0640  WBC 3.5*  --  4.1 3.3* 3.7* 4.0  HGB 7.0* 7.8* 7.6* 8.2* 8.8* 9.1*  HCT 21.4* 24.0* 23.2* 25.7* 26.7* 27.4*  MCV 105.9*  --  102.2* 102.0* 101.1* 101.1*  PLT 125*  --  124* 120* 125* 145*   Cardiac Enzymes: No results for input(s): CKTOTAL, CKMB, CKMBINDEX, TROPONINI in the last 168 hours. BNP (last 3 results) No results for input(s): PROBNP in the last 8760 hours. CBG: No results for input(s): GLUCAP in the last 168 hours. D-Dimer: No results for input(s): DDIMER in the last 72 hours. Hgb A1c: No results for input(s): HGBA1C in the last 72 hours. Lipid Profile: No results for input(s): CHOL, HDL, LDLCALC, TRIG, CHOLHDL, LDLDIRECT in the last 72 hours. Thyroid function studies: No results for input(s): TSH, T4TOTAL, T3FREE, THYROIDAB in the last 72 hours.  Invalid input(s): FREET3 Anemia work up: No results for input(s): VITAMINB12, FOLATE, FERRITIN, TIBC, IRON, RETICCTPCT in the last 72 hours. Sepsis Labs: Recent Labs  Lab 01/05/21 0320 01/06/21 0410 01/07/21 0323 01/08/21 0640  WBC 4.1 3.3* 3.7* 4.0   Microbiology Recent Results (from the past 240 hour(s))  Culture, blood (Routine X 2) w Reflex to ID Panel     Status: Abnormal   Collection Time: 01/04/21  3:04 PM   Specimen: BLOOD LEFT HAND  Result Value Ref Range Status   Specimen Description BLOOD LEFT HAND  Final   Special Requests   Final    BOTTLES DRAWN AEROBIC AND ANAEROBIC Blood Culture results may not be optimal due to an inadequate volume of blood received in culture bottles   Culture  Setup Time   Final    GRAM POSITIVE COCCI IN CHAINS IN BOTH AEROBIC AND ANAEROBIC  BOTTLES CRITICAL RESULT CALLED TO, READ BACK BY AND VERIFIED WITH: J,MILLEN PHARMD @0851  01/05/21 EB Performed at [facility], [place] [address], [place], [place] [address]    Culture STREPTOCOCCUS GORDONII (A)  Final   Report Status 01/07/2021 FINAL  Final   Organism ID, Bacteria STREPTOCOCCUS GORDONII  Final      Susceptibility   Streptococcus gordonii - MIC*    PENICILLIN <=0.06 SENSITIVE Sensitive     CEFTRIAXONE <=0.12 SENSITIVE Sensitive     ERYTHROMYCIN 2 RESISTANT Resistant     LEVOFLOXACIN 1 SENSITIVE Sensitive     VANCOMYCIN 0.5 SENSITIVE Sensitive     * STREPTOCOCCUS GORDONII  Blood Culture ID Panel (Reflexed)     Status: Abnormal   Collection Time: 01/04/21  3:04 PM  Result Value Ref Range Status   Enterococcus faecalis NOT DETECTED NOT DETECTED Final   Enterococcus Faecium NOT DETECTED NOT DETECTED Final   Listeria monocytogenes NOT DETECTED NOT DETECTED Final   Staphylococcus species NOT DETECTED NOT DETECTED Final   Staphylococcus aureus (BCID) NOT DETECTED NOT DETECTED Final   Staphylococcus epidermidis NOT DETECTED NOT DETECTED Final   Staphylococcus lugdunensis NOT DETECTED NOT DETECTED Final   Streptococcus species DETECTED (A) NOT DETECTED Final    Comment: Not Enterococcus species, Streptococcus agalactiae, Streptococcus pyogenes, or Streptococcus pneumoniae. CRITICAL RESULT CALLED TO, READ BACK BY AND VERIFIED WITH: J,MILLEN PHARMD @0851  01/05/21 EB    Streptococcus agalactiae NOT DETECTED NOT DETECTED Final   Streptococcus pneumoniae NOT DETECTED NOT DETECTED Final   Streptococcus pyogenes NOT DETECTED NOT DETECTED Final   A.calcoaceticus-baumannii NOT DETECTED NOT DETECTED Final  Bacteroides fragilis NOT DETECTED NOT DETECTED Final   Enterobacterales NOT DETECTED NOT DETECTED Final   Enterobacter cloacae complex NOT DETECTED NOT DETECTED Final   Escherichia coli NOT DETECTED NOT DETECTED Final   Klebsiella aerogenes NOT DETECTED NOT DETECTED Final    Klebsiella oxytoca NOT DETECTED NOT DETECTED Final   Klebsiella pneumoniae NOT DETECTED NOT DETECTED Final   Proteus species NOT DETECTED NOT DETECTED Final   Salmonella species NOT DETECTED NOT DETECTED Final   Serratia marcescens NOT DETECTED NOT DETECTED Final   Haemophilus influenzae NOT DETECTED NOT DETECTED Final   Neisseria meningitidis NOT DETECTED NOT DETECTED Final   Pseudomonas aeruginosa NOT DETECTED NOT DETECTED Final   Stenotrophomonas maltophilia NOT DETECTED NOT DETECTED Final   Candida albicans NOT DETECTED NOT DETECTED Final   Candida auris NOT DETECTED NOT DETECTED Final   Candida glabrata NOT DETECTED NOT DETECTED Final   Candida krusei NOT DETECTED NOT DETECTED Final   Candida parapsilosis NOT DETECTED NOT DETECTED Final   Candida tropicalis NOT DETECTED NOT DETECTED Final   Cryptococcus neoformans/gattii NOT DETECTED NOT DETECTED Final    Comment: Performed at [facility], [place] [address], [place], [place] [address]  Culture, blood (Routine X 2) w Reflex to ID Panel     Status: Abnormal   Collection Time: 01/04/21  3:11 PM   Specimen: BLOOD LEFT HAND  Result Value Ref Range Status   Specimen Description BLOOD LEFT HAND  Final   Special Requests   Final    BOTTLES DRAWN AEROBIC ONLY Blood Culture adequate volume   Culture  Setup Time   Final    GRAM POSITIVE COCCI IN CHAINS AEROBIC BOTTLE ONLY CRITICAL VALUE NOTED.  VALUE IS CONSISTENT WITH PREVIOUSLY REPORTED AND CALLED VALUE.    Culture (A)  Final    STREPTOCOCCUS GORDONII SUSCEPTIBILITIES PERFORMED ON PREVIOUS CULTURE WITHIN THE LAST 5 DAYS. Performed at [facility], [place] [address], [place], [place] [address]    Report Status 01/07/2021 FINAL  Final  Resp Panel by RT-PCR (Flu A&B, Covid) Nasopharyngeal Swab     Status: None   Collection Time: 01/05/21  6:31 AM   Specimen: Nasopharyngeal Swab; Nasopharyngeal(NP) swabs in vial transport medium  Result Value Ref Range Status   SARS Coronavirus  2 by RT PCR NEGATIVE NEGATIVE Final    Comment: (NOTE) SARS-CoV-2 target nucleic acids are NOT DETECTED.  The SARS-CoV-2 RNA is generally detectable in upper respiratory specimens during the acute phase of infection. The lowest concentration of SARS-CoV-2 viral copies this assay can detect is 138 copies/mL. A negative result does not preclude SARS-Cov-2 infection and should not be used as the sole basis for treatment or other patient management decisions. A negative result may occur with  improper specimen collection/handling, submission of specimen other than nasopharyngeal swab, presence of viral mutation(s) within the areas targeted by this assay, and inadequate number of viral copies(<138 copies/mL). A negative result must be combined with clinical observations, patient history, and epidemiological information. The expected result is Negative.  Fact Sheet for Patients:  EntrepreneurPulse.com.au  Fact Sheet for Healthcare Providers:  IncredibleEmployment.be  This test is no t yet approved or cleared by the [place] FDA and  has been authorized for detection and/or diagnosis of SARS-CoV-2 by FDA under an Emergency Use Authorization (EUA). This EUA will remain  in effect (meaning this test can be used) for the duration of the COVID-19 declaration under Section 564(b)(1) of the Act, 21 U.S.C.section 360bbb-3(b)(1), unless the  authorization is terminated  or revoked sooner.       Influenza A by PCR NEGATIVE NEGATIVE Final   Influenza B by PCR NEGATIVE NEGATIVE Final    Comment: (NOTE) The Xpert Xpress SARS-CoV-2/FLU/RSV plus assay is intended as an aid in the diagnosis of influenza from Nasopharyngeal swab specimens and should not be used as a sole basis for treatment. Nasal washings and aspirates are unacceptable for Xpert Xpress SARS-CoV-2/FLU/RSV testing.  Fact Sheet for Patients: EntrepreneurPulse.com.au  Fact  Sheet for Healthcare Providers: IncredibleEmployment.be  This test is not yet approved or cleared by the [place] FDA and has been authorized for detection and/or diagnosis of SARS-CoV-2 by FDA under an Emergency Use Authorization (EUA). This EUA will remain in effect (meaning this test can be used) for the duration of the COVID-19 declaration under Section 564(b)(1) of the Act, 21 U.S.C. section 360bbb-3(b)(1), unless the authorization is terminated or revoked.  Performed at [facility], [place] [address] [place], [place] [address]   Culture, blood (Routine X 2) w Reflex to ID Panel     Status: None (Preliminary result)   Collection Time: 01/07/21 11:24 AM   Specimen: BLOOD RIGHT FOREARM  Result Value Ref Range Status   Specimen Description BLOOD RIGHT FOREARM  Final   Special Requests   Final    BOTTLES DRAWN AEROBIC AND ANAEROBIC Blood Culture results may not be optimal due to an inadequate volume of blood received in culture bottles   Culture   Final    NO GROWTH 2 DAYS Performed at [facility], [place] [address] [place], [place] [address]    Report Status PENDING  Incomplete  Culture, blood (Routine X 2) w Reflex to ID Panel     Status: None (Preliminary result)   Collection Time: 01/07/21 11:29 AM   Specimen: BLOOD RIGHT HAND  Result Value Ref Range Status   Specimen Description BLOOD RIGHT HAND  Final   Special Requests   Final    BOTTLES DRAWN AEROBIC AND ANAEROBIC Blood Culture results may not be optimal due to an inadequate volume of blood received in culture bottles   Culture   Final    NO GROWTH 2 DAYS Performed at [facility], [place] [address], [place], [place] [address]    Report Status PENDING  Incomplete     Medications:   . sodium chloride   Intravenous Once  . melatonin  5 mg Oral QHS  . metoprolol tartrate  25 mg Oral BID  . pantoprazole  40 mg Intravenous Q12H   Continuous Infusions: . sodium chloride 10 mL/hr  at 01/08/21 1039  . sodium chloride 20 mL/hr at 01/08/21 1552  . gentamicin 200 mg (01/08/21 2251)  . heparin 1,900 Units/hr (01/09/21 0551)  . penicillin g continuous IV infusion 12 Million Units (01/09/21 0549)      LOS: 6 days   [name]  [facility]  01/09/2021, 8:20 AM

## 2021-01-09 NOTE — Progress Notes (Signed)
ANTICOAGULATION CONSULT NOTE - Follow-Up Consult  Pharmacy Consult for Heparin Indication: atrial fibrillation and stroke  No Known Allergies  Patient Measurements: Height: 5' 10.5" (179.1 cm) Weight: 79.8 kg (175 lb 14.8 oz) IBW/kg (Calculated) : 74.15 Heparin Dosing Weight: 74 kg  Vital Signs: Temp: 98.9 F (37.2 C) (03/24 0348) Temp Source: Oral (03/24 0348) BP: 131/72 (03/24 0348) Pulse Rate: 76 (03/24 0348)  Labs: Recent Labs    01/07/21 0323 01/07/21 1125 01/08/21 0640 01/08/21 1920 01/09/21 0406  HGB 8.8*  --  9.1*  --   --   HCT 26.7*  --  27.4*  --   --   PLT 125*  --  145*  --   --   HEPARINUNFRC <0.10*   < > 0.15* 0.10* 0.16*  CREATININE 1.01  --  0.93  --   --    < > = values in this interval not displayed.    Estimated Creatinine Clearance: 82 mL/min (by C-G formula based on SCr of 0.93 mg/dL).  Assessment: 48 YOM on warfarin PTA for hx Afib, prior CVA who presented on 3/18 with new strokes on MRI, concern for GIB. The patient is s/p EGD on 3/20 which showed erosive gastropathy and non-bleeding duodenal ulcer. GI okayed restart of anticoagulation. Pharmacy consulted to start Heparin for bridging - no bolus and low goal with CVA + GIB eval. The MD does not plan to resume Warfarin at this time as thinks the patient would likely benefit from transition to Apixaban.   Heparin level this morning remains SUBherapeutic but now detectable after multiple rate increases.  L-AC IV site checked by RN yesterday - some puffiness noted, RN stated not drawing back blood well. Switched Heparin to alternative IV site on R-arm.   CBC stable with Hgb 8-9's, plts 145 - will continue to monitor. No bleeding noted per discussion with RN.  3/24 AM update:  Heparin level low but trending up this AM  Goal of Therapy:  Heparin level 0.3-0.5 units/ml Monitor platelets by anticoagulation protocol: Yes   Plan:  - Increase Heparin to 1900 units/hr  - Heparin level in 8 hours - No  warfarin restart for now - MD evaluating transition to Apixaban - Will continue to monitor for any signs/symptoms of bleeding  Narda Bonds, PharmD, BCPS Clinical Pharmacist Phone: [phone]

## 2021-01-09 NOTE — Progress Notes (Signed)
Department of Dental Medicine     INPATIENT CONSULTATION  Service Date:   01/09/2021 Admitted Date:  01/03/2021  Patient Name:  Donald Harrington Date of Birth:   Feb 19, 1956 Medical Record Number: [id number]  Referring Provider:              [name], MD  PLAN & RECOMMENDATIONS   > There are no current signs of acute odontogenic infection including abscess, edema or erythema.  The patient does have multiple teeth with caries and chronically infected retained root tips. >> Although it is not likely that the patient's bacteremia and endocarditis is due to dental infection, recommend multiple extractions of infected teeth and teeth with severe decay to decrease risk of recurrent systemic infection and pain. >>> Plan to discuss case with medical team and coordinate treatment as needed.  If patient is able to have teeth extracted under general anesthesia in the OR during admission, he will have to hold heparin 4-6 hours before the scheduled surgery and can resume 6-8 hours after completion.  Otherwise recommend following-up with patient after discharge and recovery.  >>  Recommend the patient establish care at a dental office of their choice for routine dental care including replacement of missing teeth, cleanings and exams. >>  Discussed in all findings and treatment options with the patient and they are agreeable to the plan.   Thank you for consulting with Hospital Dentistry and for the opportunity to participate in this patient's treatment.  Should you have any questions or concerns, please contact the [facility] at [phone].   01/09/2021     CONSULT NOTE    HISTORY OF PRESENT ILLNESS: >> Donald Harrington is a pleasant 65 y.o. male with h/o aortic valve replacement, mitral valve replacement, past h/o endocarditis in 2015, anemia d/t acute blood loss, paroxysmal atrial fibrillation, alcohol use, stroke, long-term use of anticoagulation (on Coumadin as outpatient but  currently on heparin during admission) who is currently admitted for work-up concern for endocarditis and confirmed Streptococcus bacteremia.  Hospital Dentistry was consulted to evaluate the patient due to possible endocarditis in the setting of poor dentition.  FINDINGS FROM 3/23 ORTHOPANOGRAM: The mandible is intact. The maxilla is intact. There is numerous absent dentition involving the maxillary and mandibular molars and the majority of a maxillary and mandibular premolars.z dental caries are identified within tooth 8, the residual fragment of tooth 13, tooth 24, and tooth 25. A lucent lesion is seen within the maxilla within the defect related to the absent tooth 10 demonstrating relatively poorly circumscribed margins and no significant matrix which may represent a residual periapical abscess.  DENTAL HISTORY: >The patient reports that he does not have a dentist that he sees regularly. He cannot remember the last time he went to a dentist or had teeth extracted. Prior to examination, patient reported that "it is embarrassing," referring to his current oral health and remaining dentition. He currently denies any dental/orofacial pain or sensitivity. >> Patient is able to manage oral secretions.  Patient denies dysphagia, odynophagia, dysphonia, and neck pain.  Patient denies fever, rigors and malaise.   CHIEF COMPLAINT: Dental consultation due to Streptococcus bacteremia and concern for endocarditis.   Patient Active Problem List   Diagnosis Date Noted  . Bacteremia due to Streptococcus   . Duodenal ulcer   . Acute gastric erosion   . Acute embolic stroke ([place]) 07/11/3006  . GI bleed 01/03/2021  . Acute blood loss anemia 01/03/2021  . Cough 01/03/2021  . PAF (  paroxysmal atrial fibrillation) ([place]) 01/03/2021  . History of aortic valve replacement 01/03/2021  . History of mitral valve replacement 01/03/2021   Past Medical History:  Diagnosis Date  . Alcohol abuse   . Bladder  cancer ([place])    remission s/p TURBT  . CAD (coronary artery disease), native coronary artery    MI in setting of endocarditis  . 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 Endocarditis    Mitral and Aortic valve 2015  . HTN (hypertension)   . Hypothyroidism   . NICM (nonischemic cardiomyopathy) ([place])    Normal EF as of 2020 echo  . PAF (paroxysmal atrial fibrillation) ([facility])    coumadin anticoagulation due to h/o valve replacements with bioprosthetic valves  . PUD (peptic ulcer disease)    causing GIB in 2015   Past Surgical History:  Procedure Laterality Date  . AORTIC VALVE REPLACEMENT  04/2014   bioprosthetic  . BIOPSY  01/05/2021   Procedure: BIOPSY;  Surgeon: Thornton Park, MD;  Location: [facility];  Service: Gastroenterology;;  . ESOPHAGOGASTRODUODENOSCOPY (EGD) WITH PROPOFOL N/A 01/05/2021   Procedure: ESOPHAGOGASTRODUODENOSCOPY (EGD) WITH PROPOFOL;  Surgeon: Thornton Park, MD;  Location: [facility];  Service: Gastroenterology;  Laterality: N/A;  . LAPAROSCOPIC APPENDECTOMY  2021  . 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 TRANSURETHRAL RESECTION OF BLADDER TUMOR     No Known Allergies Current Facility-Administered Medications  Medication Dose Route Frequency Provider Last Rate Last Admin  . 0.9 %  sodium chloride infusion (Manually program via Guardrails IV Fluids)   Intravenous Once Oletta Lamas, CRNA   Stopped at 01/05/21 2143  . 0.9 %  sodium chloride infusion   Intravenous Continuous [name], MD 10 mL/hr at 01/08/21 1039 Restarted at 01/08/21 1056  . 0.9 %  sodium chloride infusion   Intravenous Continuous Ledora Bottcher, [place] 20 mL/hr at 01/09/21 1507 Rate Change at 01/09/21 1507  . acetaminophen (TYLENOL) tablet 650 mg  650 mg Oral Q6H PRN Etta Quill, DO   650 mg at 01/09/21 2052   Or  . acetaminophen (TYLENOL) suppository 650 mg  650 mg Rectal Q6H PRN Etta Quill, DO      . gentamicin (GARAMYCIN) 200 mg in  dextrose 5 % 50 mL IVPB  200 mg Intravenous Q24H Caren Griffins, MD 110 mL/hr at 01/08/21 2251 200 mg at 01/08/21 2251  . heparin ADULT infusion 100 units/mL (25000 units/283mL)  2,050 Units/hr Intravenous Continuous Rolla Flatten, RPH 20.5 mL/hr at 01/09/21 2042 2,050 Units/hr at 01/09/21 2042  . melatonin tablet 5 mg  5 mg Oral QHS Adefeso, [place], DO   5 mg at 01/08/21 2247  . metoprolol tartrate (LOPRESSOR) tablet 25 mg  25 mg Oral BID Caren Griffins, MD   25 mg at 01/09/21 1009  . Muscle Rub CREA   Topical PRN Caren Griffins, MD   Given at 01/06/21 2224  . ondansetron (ZOFRAN) tablet 4 mg  4 mg Oral Q6H PRN Etta Quill, DO       Or  . ondansetron [facility]) injection 4 mg  4 mg Intravenous Q6H PRN Etta Quill, DO      . pantoprazole (PROTONIX) injection 40 mg  40 mg Intravenous Q12H Vena Rua, PA-C   40 mg at 01/09/21 1009  . penicillin G potassium 12 Million Units in dextrose 5 % 500 mL continuous infusion  12 Million Units Intravenous Q12H Mazomanie Callas,  NP 41.7 mL/hr at 01/09/21 1728 12 Million Units at 01/09/21 1728  . traMADol (ULTRAM) tablet 50 mg  50 mg Oral Q12H PRN Caren Griffins, MD   50 mg at 01/09/21 2052    LABS: Lab Results  Component Value Date   WBC 4.0 01/08/2021   HGB 9.1 (L) 01/08/2021   HCT 27.4 (L) 01/08/2021   MCV 101.1 (H) 01/08/2021   PLT 145 (L) 01/08/2021      Component Value Date/Time   NA 138 01/08/2021 0640   K 4.2 01/08/2021 0640   CL 107 01/08/2021 0640   CO2 27 01/08/2021 0640   GLUCOSE 92 01/08/2021 0640   BUN 10 01/08/2021 0640   CREATININE 0.93 01/08/2021 0640   CALCIUM 8.0 (L) 01/08/2021 0640   GFRNONAA >60 01/08/2021 0640   Lab Results  Component Value Date   INR 1.5 (H) 01/05/2021   No results found for: PTT  Social History   Socioeconomic History  . Marital status: Single    Spouse name: Not on file  . Number of children: Not on file  . Years of education: Not on file  . Highest education  level: Not on file  Occupational History  . Not on file  Tobacco Use  . Smoking status: Current Every Day Smoker    Packs/day: 1.00    Years: 40.00    Pack years: 40.00  . Smokeless tobacco: Never Used  Substance and Sexual Activity  . Alcohol use: Not Currently    Comment: last 2017  . Drug use: Never  . Sexual activity: Not on file  Other Topics Concern  . Not on file  Social History Narrative  . Not on file   Social Determinants of Health   Financial Resource Strain: Not on file  Food Insecurity: Not on file  Transportation Needs: Not on file  Physical Activity: Not on file  Stress: Not on file  Social Connections: Not on file  Intimate Partner Violence: Not on file   Family History  Problem Relation Age of Onset  . Thyroid disease Sister      REVIEW OF SYSTEMS: Reviewed with the patient as per HPI. PSYCH: Patient denies having dental phobia.  VITAL SIGNS: BP 110/66 (BP Location: Left Arm)   Pulse 100   Temp 99 F (37.2 C) (Oral)   Resp 18   Ht 5' 10.5" (1.791 m)   Wt 79.8 kg   SpO2 99%   BMI 24.89 kg/m    PHYSICAL EXAM: >> General:  Well-developed, comfortable and in no apparent distress. >> Neurological:  Alert and oriented to person, place and  time. >> Extraoral:  Facial symmetry present without any edema or erythema.  No swelling or lymphadenopathy. >> Intraoral:  Soft tissues appear well-perfused and mucous membranes moist.  FOM and vestibules soft and not raised. Oral cavity without mass or lesion. No signs of infection, parulis, sinus tract, edema or erythema evident upon exam.   DENTAL EXAM: All clinical findings charted.   >> Dentition:  Overall poor remaining dentition.  Missing teeth, caries, retained root tips.   >> The patient is maintaining poor oral hygiene.  >> Periodontal: Inflamed, erythematous gingival tissue. Generalized plaque accumulation, gingival recession. >> Caries: Multiple teeth with clinical caries- #8 and #9 with severe  caries. Several fractured/broken teeth due to compromised coronal tooth structure. Decay in all 4 quadrants of remaining dentition. >> Retained Root Tips: #12, #13, #15 and #21. >> Removable/Fixed Prosthodontics: Patient denies wearing partial dentures. >>  Occlusion: Unable to assess molar occlusion.  Non-functional and supra-erupted teeth #5 and #6.   RADIOGRAPHIC EXAM:  01/08/21 Orthopanogram interpreted. >> Condyles seated bilaterally in fossas.  No evidence of abnormal pathology.  All visualized osseous structures appear WNL. >> Generalized moderate horizontal bone loss consistent with moderate periodontitis. Missing teeth, caries- #7, #8 and #9 deep decay approximating the pulp. Retained root tips #12, #13, #15 and #21. Periapical radiolucencies evident on #12, #13 and #15.   ASSESSMENT:  1. Streptococcus bacteremia with concern for endocarditis 2. Inpatient dental consultation 3. H/o bioprosthetic valve replacement 4. Long-term use of anti-coagulation (on Coumadin)  5. Missing teeth 6. Caries 7. Retained root tips 8. Chronic apical periodontitis 9. Chronic periodontitis 10. Accretions on teeth 11. Gingival recession 12. Postoperative bleeding risk   PLAN AND RECOMMENDATIONS: > I discussed the risks, benefits, and complications of various scenarios with the patient in relationship to their medical and dental conditions, which included systemic infection such as endocarditis, bacteremia or other serious issues that could potentially occur or be worsening by dental/oral concerns and poor oral health.  I explained all significant findings of the dental consultation with the patient including multiple retained root tips, teeth with cavities that are chronically infected although they may not be causing symptoms or any acute effects, and the recommended care including multiple extractions of all grossly decayed and infected teeth in order to optimize them from a dental standpoint.  The  patient verbalized understanding of all findings, discussion, and recommendations. >> We then discussed various treatment options to include no treatment, multiple extractions with alveoloplasty, pre-prosthetic surgery as indicated, periodontal therapy, dental restorations, root canal therapy, crown and bridge therapy, implant therapy, and replacement of missing teeth as indicated.  The patient verbalized understanding of all options, and currently wishes to proceed with all recommended/indicated treatment including multiple extractions either during admission or after discharge pending medical team's recommendations. Recommend all treatment be completed in the operating room under general anesthesia due to the patient's medical conditions and extent of treatment that is indicated. >>>  Plan to discuss all findings and recommendations with medical team and coordinate future care as needed.   <> The patient tolerated today's visit well.  All questions and concerns were addressed and answered before conclusion of the consultation.   Luverne Benson [place], D.M.D.

## 2021-01-09 NOTE — Progress Notes (Signed)
ANTICOAGULATION CONSULT NOTE  Pharmacy Consult for Heparin Indication: atrial fibrillation and stroke  No Known Allergies  Patient Measurements: Height: 5' 10.5" (179.1 cm) Weight: 79.8 kg (175 lb 14.8 oz) IBW/kg (Calculated) : 74.15 Heparin Dosing Weight: 74 kg  Vital Signs: Temp: 99 F (37.2 C) (03/24 2033) Temp Source: Oral (03/24 2033) BP: 110/66 (03/24 2033) Pulse Rate: 100 (03/24 2033)  Labs: Recent Labs    01/07/21 0323 01/07/21 1125 01/08/21 0640 01/08/21 1920 01/09/21 0406 01/09/21 1349 01/09/21 2206  HGB 8.8*  --  9.1*  --   --   --   --   HCT 26.7*  --  27.4*  --   --   --   --   PLT 125*  --  145*  --   --   --   --   HEPARINUNFRC <0.10*   < > 0.15*   < > 0.16* 0.18* 0.24*  CREATININE 1.01  --  0.93  --   --   --   --    < > = values in this interval not displayed.    Estimated Creatinine Clearance: 82 mL/min (by C-G formula based on SCr of 0.93 mg/dL).  Assessment: 65 yo male with hx Afib and prior CVA,  admitted with new embolic CVA, for heparin  Goal of Therapy:  Heparin level 0.3-0.5 units/ml Monitor platelets by anticoagulation protocol: Yes   Plan:  Increase Heparin 2300 units/hr Follow-up am labs.   Phillis Knack, PharmD, BCPS

## 2021-01-10 ENCOUNTER — Encounter (HOSPITAL_COMMUNITY): Payer: Self-pay | Admitting: Cardiovascular Disease

## 2021-01-10 ENCOUNTER — Inpatient Hospital Stay (HOSPITAL_COMMUNITY): Payer: Medicare HMO

## 2021-01-10 ENCOUNTER — Inpatient Hospital Stay: Payer: Self-pay

## 2021-01-10 DIAGNOSIS — D62 Acute posthemorrhagic anemia: Secondary | ICD-10-CM | POA: Diagnosis not present

## 2021-01-10 DIAGNOSIS — K029 Dental caries, unspecified: Secondary | ICD-10-CM | POA: Diagnosis not present

## 2021-01-10 DIAGNOSIS — B955 Unspecified streptococcus as the cause of diseases classified elsewhere: Secondary | ICD-10-CM | POA: Diagnosis not present

## 2021-01-10 DIAGNOSIS — K045 Chronic apical periodontitis: Secondary | ICD-10-CM

## 2021-01-10 DIAGNOSIS — K056 Periodontal disease, unspecified: Secondary | ICD-10-CM

## 2021-01-10 DIAGNOSIS — R7881 Bacteremia: Secondary | ICD-10-CM | POA: Diagnosis not present

## 2021-01-10 DIAGNOSIS — I480 Paroxysmal atrial fibrillation: Secondary | ICD-10-CM | POA: Diagnosis not present

## 2021-01-10 DIAGNOSIS — I639 Cerebral infarction, unspecified: Secondary | ICD-10-CM | POA: Diagnosis not present

## 2021-01-10 LAB — CBC
HCT: 28.2 % — ABNORMAL LOW (ref 39.0–52.0)
Hemoglobin: 9 g/dL — ABNORMAL LOW (ref 13.0–17.0)
MCH: 33.3 pg (ref 26.0–34.0)
MCHC: 31.9 g/dL (ref 30.0–36.0)
MCV: 104.4 fL — ABNORMAL HIGH (ref 80.0–100.0)
Platelets: 156 10*3/uL (ref 150–400)
RBC: 2.7 MIL/uL — ABNORMAL LOW (ref 4.22–5.81)
RDW: 16.6 % — ABNORMAL HIGH (ref 11.5–15.5)
WBC: 4.9 10*3/uL (ref 4.0–10.5)
nRBC: 0 % (ref 0.0–0.2)

## 2021-01-10 LAB — BASIC METABOLIC PANEL
Anion gap: 3 — ABNORMAL LOW (ref 5–15)
BUN: 9 mg/dL (ref 8–23)
CO2: 27 mmol/L (ref 22–32)
Calcium: 8.3 mg/dL — ABNORMAL LOW (ref 8.9–10.3)
Chloride: 107 mmol/L (ref 98–111)
Creatinine, Ser: 1.11 mg/dL (ref 0.61–1.24)
GFR, Estimated: >60 mL/min (ref >60)
Glucose, Bld: 96 mg/dL (ref 70–99)
Potassium: 4.8 mmol/L (ref 3.5–5.1)
Sodium: 137 mmol/L (ref 135–145)

## 2021-01-10 LAB — HEPARIN LEVEL (UNFRACTIONATED): Heparin Unfractionated: 0.4 IU/mL (ref 0.30–0.70)

## 2021-01-10 IMAGING — MR MR CERVICAL SPINE W/O CM
4 of 6 series · 17 of 48 positions shown · non-contrast
Comparison: Prior MRI from [DATE].

CLINICAL DATA: Initial evaluation for acute neck pain, infection
suspected.

EXAM:
MRI CERVICAL SPINE WITHOUT CONTRAST
TECHNIQUE: Multiplanar, multisequence MR imaging of the cervical spine was
performed. No intravenous contrast was administered.

[Series 2: T1 · sagittal · 3.0mm · 0.43mm/px · 3 of 11 slices shown (1 of 2)]
[im 1/11]
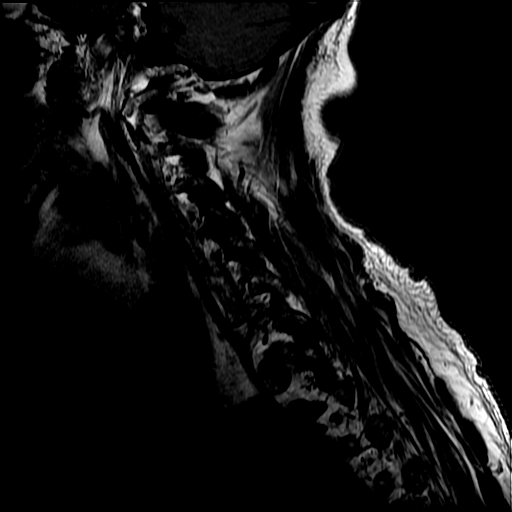
[im 6/11]
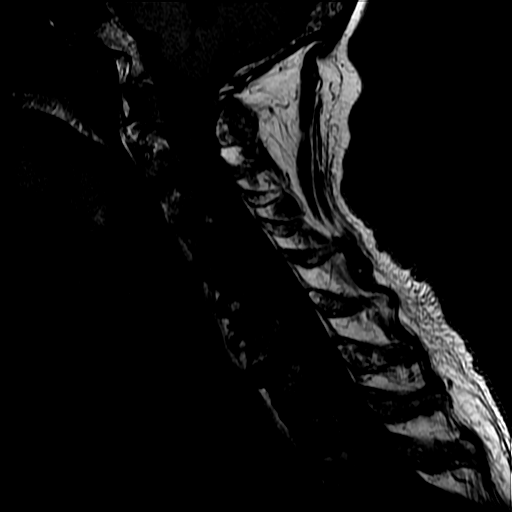
[im 11/11]
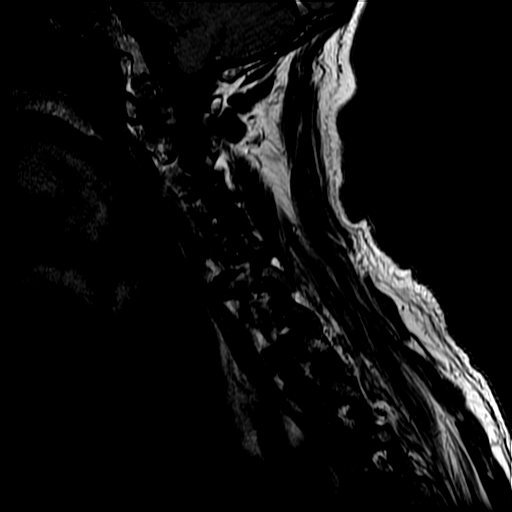

[Series 3: T2 · sagittal · 3.0mm · 0.43mm/px · 4 of 11 slices shown (1 of 2)]
[im 1/11]
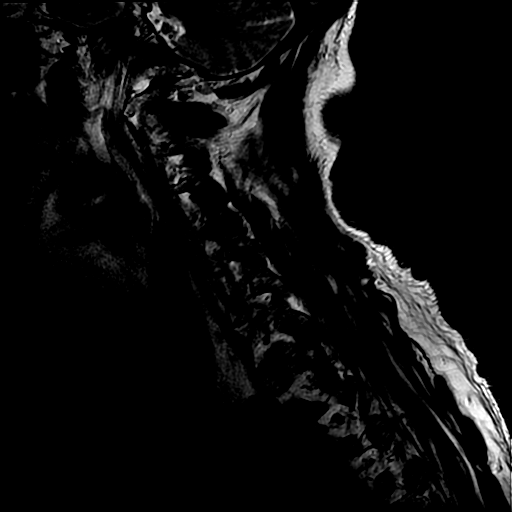
[im 4/11]
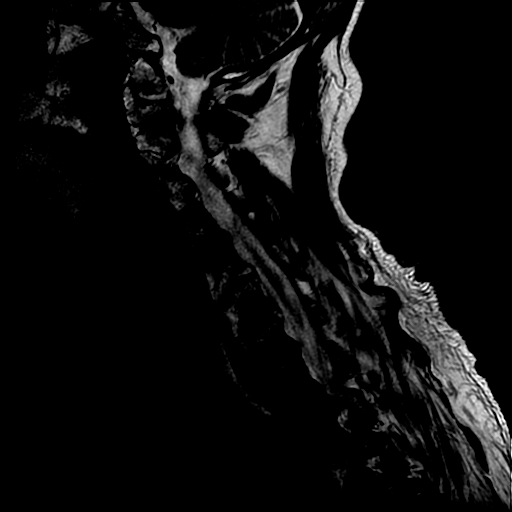
[im 7/11]
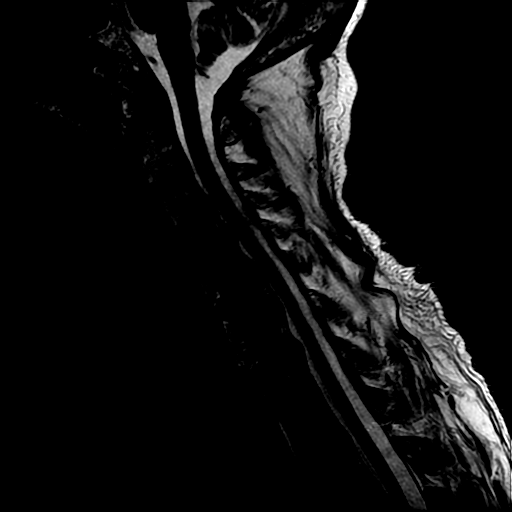
[im 11/11]
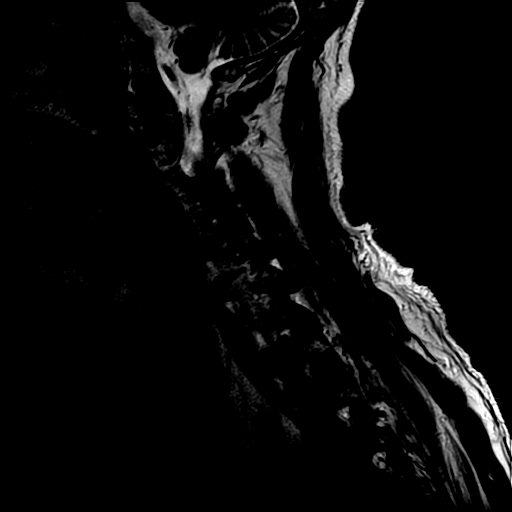

[Series 6: T1 · axial · non-contrast · 3.0mm · 0.41mm/px · z∈[-68,+12]mm · 3 of 29 slices shown (2 of 2)]
[im 3/29]
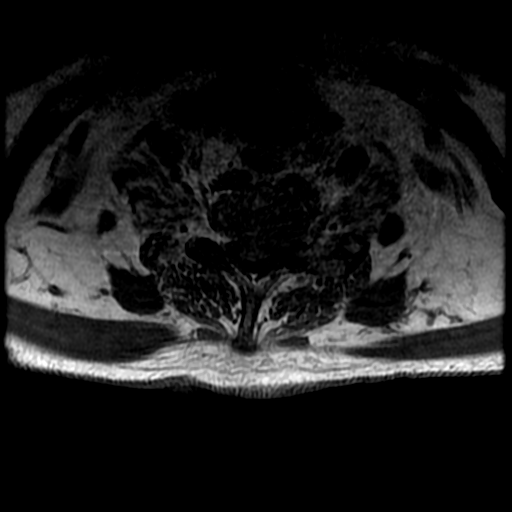
[im 15/29]
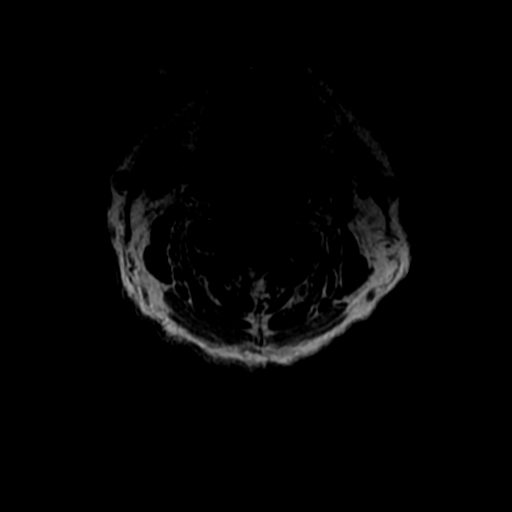
[im 26/29]
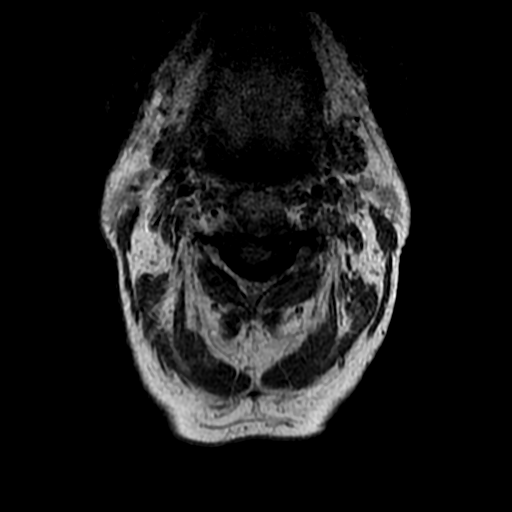

[Series 7: T2 · axial · 3.0mm · 0.41mm/px · z∈[-74,+14]mm · 7 of 33 slices shown (2 of 2)]
[im 1/33]
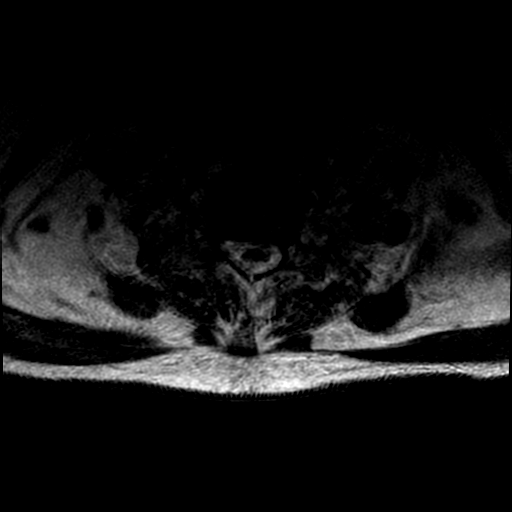
[im 6/33]
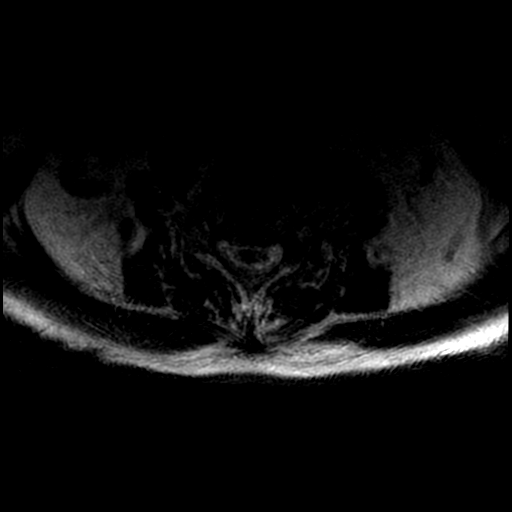
[im 9/33]
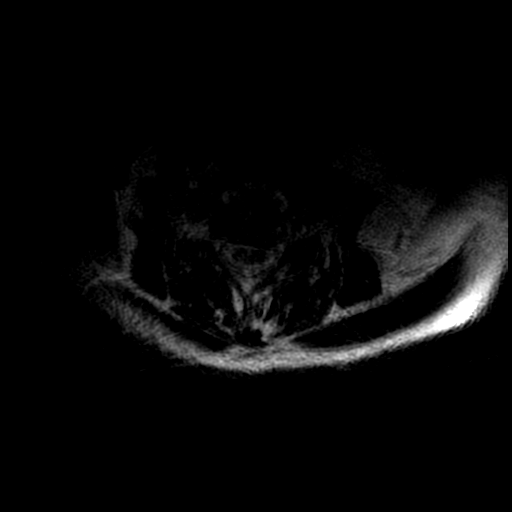
[im 15/33]
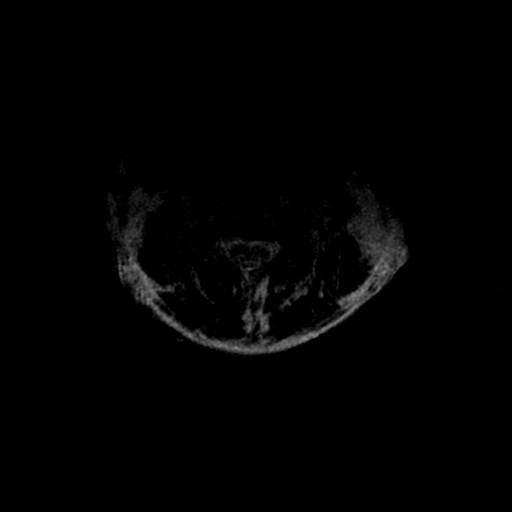
[im 18/33]
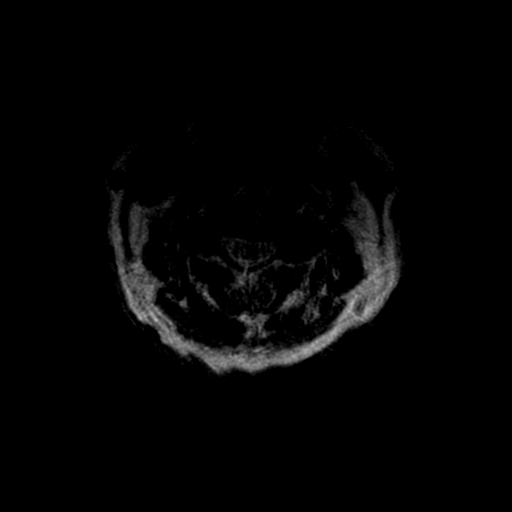
[im 24/33]
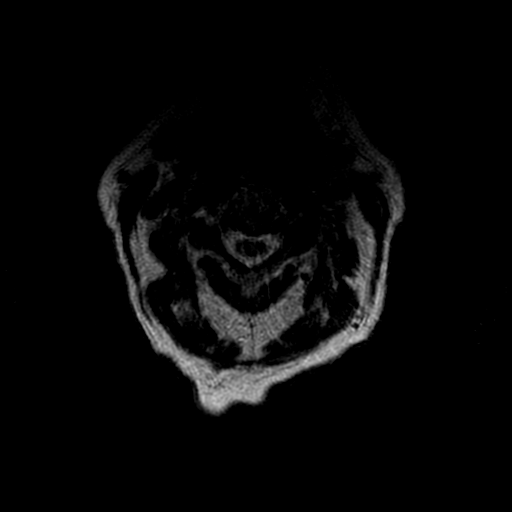
[im 30/33]
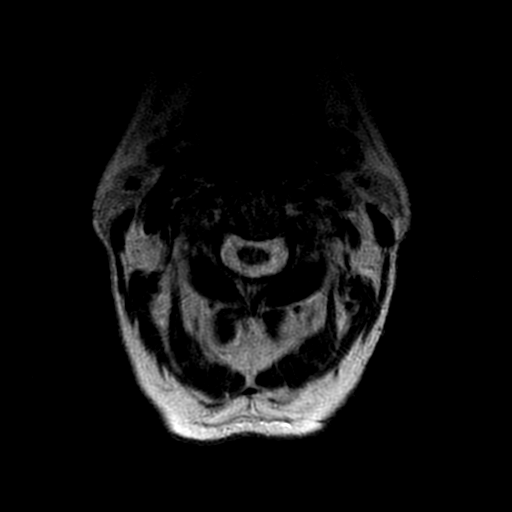

[17 of 48 positions shown; findings below may reference images not displayed]

FINDINGS: Alignment: Examination somewhat degraded by motion artifact.

Straightening with slight reversal of the normal cervical lordosis.
Trace anterolisthesis of C3 on C4, stable.

Vertebrae: Vertebral body height maintained without acute or
interval fracture. Bone marrow signal intensity diffusely
heterogeneous without worrisome osseous lesion. Discogenic reactive
endplate changes present about the C5-6 through C7-T1 interspaces,
with partial ankylosis across the C6-7 disc space. Appearance is
stable from previous. No signal changes to suggest osteomyelitis
discitis or septic arthritis.

Cord: Normal signal and morphology.  No epidural collections.

Posterior Fossa, vertebral arteries, paraspinal tissues: Visualized
brain and posterior fossa within normal limits. Craniocervical
junction normal. Paraspinous and prevertebral soft tissues within
normal limits. Normal flow voids seen within the vertebral arteries
bilaterally.

Disc levels:

C2-C3: Small central disc protrusion mildly indents the ventral
thecal sac, slightly asymmetric to the left. No significant spinal
stenosis or cord deformity. Superimposed mild left greater than
right facet degeneration without significant foraminal stenosis.
Appearance is stable.

C3-C4: Trace anterolisthesis. Central disc protrusion indents the
ventral thecal sac, contacting and mildly flattening the ventral
spinal cord. No cord signal changes or significant spinal stenosis.
Superimposed left greater than right uncovertebral and facet
hypertrophy with resultant mild left C4 foraminal stenosis. Right
neural foramina remains patent. Appearance is stable.

C4-C5: Degenerative intervertebral disc space narrowing with diffuse
disc osteophyte complex, eccentric to the left. Broad posterior
component flattens and partially effaces the ventral thecal sac with
resultant mild spinal stenosis. Mild cord flattening without cord
signal changes. Superimposed bilateral facet hypertrophy with
resultant moderate to severe bilateral C5 foraminal narrowing.
Appearance is stable.

C5-C6: Degenerative intervertebral disc space narrowing with diffuse
disc osteophyte complex, asymmetric to the left. Broad posterior
component flattens and partially faces the ventral thecal sac. Mild
flattening of the ventral cord, greater on the left. No cord signal
changes. Mild spinal stenosis is stable. Moderate bilateral C6
foraminal narrowing, worse on the right, also stable.

C6-C7: Advanced degenerative intervertebral disc space narrowing
with partial ankylosis across the C6-7 interspace. Right greater
than left uncovertebral spurring. Posterior disc osteophyte flattens
and partially effaces the ventral thecal sac without significant
stenosis or cord impingement. Superimposed mild facet hypertrophy.
Mild bilateral C7 foraminal stenosis is relatively stable.

C7-T1: Degenerative intervertebral disc space narrowing with diffuse
disc osteophyte complex. Flattening and partial effacement of the
ventral thecal sac without significant spinal stenosis or cord
deformity. Superimposed bilateral facet hypertrophy. Moderate left
with mild right C8 foraminal narrowing, stable.

Visualized upper thoracic spine demonstrates no significant finding.
IMPRESSION: 1. No MRI evidence for acute infection or other abnormality within
the cervical spine.
2. Multilevel cervical spondylosis with resultant mild spinal
stenosis at C4-5 and C5-6.
3. Multifactorial degenerative changes with resultant multilevel
foraminal narrowing as above. Notable findings include moderate to
severe bilateral C5 foraminal narrowing, with moderate bilateral C6
and left C8 foraminal stenosis.

## 2021-01-10 MED ORDER — APIXABAN 5 MG PO TABS
5.0000 mg | ORAL_TABLET | Freq: Two times a day (BID) | ORAL | Status: DC
  Administered 2021-01-10 – 2021-01-20 (×22): 5 mg via ORAL
  Filled 2021-01-10 (×22): qty 1

## 2021-01-10 MED ORDER — SODIUM CHLORIDE 0.9% FLUSH: 10.0000 mL | INTRAVENOUS | Status: DC | PRN

## 2021-01-10 MED ORDER — CHLORHEXIDINE GLUCONATE CLOTH 2 % EX PADS
6.0000 | MEDICATED_PAD | Freq: Every day | CUTANEOUS | Status: DC
  Administered 2021-01-10 – 2021-01-20 (×9): 6 via TOPICAL

## 2021-01-10 NOTE — Progress Notes (Signed)
Occupational Therapy Treatment Patient Details Name: Donald Harrington MRN: [id number] DOB: [birth year] Today's Date: 01/10/2021    History of present illness 65 y.o. male presenting from outpatient MRI showing multifocal acute embolic strokes presents (subcentimeter bilateral cerebral and cerebellar infarcts). Also presenting with acute anemia and dark stools concerning for a GI bleed. Chest x-ray impression of COPD and bibasilar atelectasis or infiltrates, new since prior study. 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   OT comments  OT treatment session with focus on self-care re-education, ADL transfers, and OOB activity tolerance. Patient making progress toward goals demonstrating ability to ambulate household and community distances with use of SPC and Min guard this date. Patient with continued decreased safety awareness, poor frustration tolerance and continues to be limited by deficits listed below 2/2 diagnosis above. Continued recommendation for CIR. OT will continue to follow acutely.    Follow Up Recommendations  CIR    Equipment Recommendations  Tub/shower seat    Recommendations for Other Services      Precautions / Restrictions Precautions Precautions: Fall Precaution Comments: HOH; HR < 130 (return to bed if >130) Restrictions Weight Bearing Restrictions: No       Mobility Bed Mobility Overal bed mobility: Modified Independent             General bed mobility comments: Supine <> EOB with Mod I.    Transfers Overall transfer level: Needs assistance Equipment used: Rolling walker (2 wheeled);1 person hand held assist Transfers: Sit to/from Stand Sit to Stand: Min guard         General transfer comment: Min guard for steadying/safety.    Balance Overall balance assessment: Needs  assistance Sitting-balance support: No upper extremity supported;Feet supported Sitting balance-Leahy Scale: Good     Standing balance support: Bilateral upper extremity supported Standing balance-Leahy Scale: Poor Standing balance comment: Reliant on at least unilateral UE support with mobility.                           ADL either performed or assessed with clinical judgement   ADL Overall ADL's : Needs assistance/impaired                     Lower Body Dressing: Min guard;Sit to/from stand Lower Body Dressing Details (indicate cue type and reason): Able to don pants in sitting/standing with Min guard for safety. Increased time/effort to thread BLE.             Functional mobility during ADLs: Min guard;Minimal assistance;Rolling walker General ADL Comments: Pt presenting with decreased balance, strength, and functional use of LUE.     Vision       Perception     Praxis      Cognition Arousal/Alertness: Awake/alert Behavior During Therapy: WFL for tasks assessed/performed Overall Cognitive Status: Impaired/Different from baseline Area of Impairment: Memory;Problem solving;Safety/judgement                     Memory: Decreased recall of precautions;Decreased short-term memory   Safety/Judgement: Decreased awareness of safety   Problem Solving: Slow processing;Requires verbal cues General Comments: Patient with low frustration tolerance and quick mood changes. Fixates on situations and placement of objects in room. Requires thorough explanation of plan before initiation of movement. Patient with decreased awareness of safety  refusing use of RW but wanting to hold SPC in one hand and IV pole in the other. Doesn't realize that this would present at fall risk.        Exercises     Shoulder Instructions       General Comments      Pertinent Vitals/ Pain       Pain Assessment: No/denies pain  Home Living                                           Prior Functioning/Environment              Frequency  Min 3X/week        Progress Toward Goals  OT Goals(current goals can now be found in the care plan section)     Acute Rehab OT Goals Patient Stated Goal: To return home independently. OT Goal Formulation: With patient Time For Goal Achievement: 01/20/21 Potential to Achieve Goals: Good ADL Goals Pt Will Perform Grooming: with modified independence;standing Pt Will Perform Lower Body Dressing: with modified independence;sit to/from stand;sitting/lateral leans Pt Will Transfer to Toilet: with modified independence;ambulating;regular height toilet Pt Will Perform Tub/Shower Transfer: with supervision;Shower transfer;shower seat;ambulating Pt/caregiver will Perform Home Exercise Program: Increased strength;Left upper extremity;With Supervision;With written HEP provided Additional ADL Goal #1: Pt will perform simple IADL with 2-3 cues  Plan Discharge plan remains appropriate;Frequency remains appropriate    Co-evaluation                 AM-PAC OT "6 Clicks" Daily Activity     Outcome Measure   Help from another person eating meals?: None Help from another person taking care of personal grooming?: A Little Help from another person toileting, which includes using toliet, bedpan, or urinal?: A Little Help from another person bathing (including washing, rinsing, drying)?: A Little Help from another person to put on and taking off regular upper body clothing?: A Little Help from another person to put on and taking off regular lower body clothing?: A Little 6 Click Score: 19    End of Session Equipment Utilized During Treatment: Gait belt;Rolling walker  OT Visit Diagnosis: Unsteadiness on feet (R26.81);Other abnormalities of gait and mobility (R26.89);Muscle weakness (generalized) (M62.81);Hemiplegia and hemiparesis;Pain Hemiplegia - Right/Left: Left Hemiplegia -  dominant/non-dominant: Non-Dominant Hemiplegia - caused by: Cerebral infarction   Activity Tolerance Patient tolerated treatment well   Patient Left in chair;with call bell/phone within reach;with chair alarm set   Nurse Communication Mobility status        Time: 7989-2119 OT Time Calculation (min): 20 min  Charges: OT General Charges $OT Visit: 1 Visit OT Treatments $Self Care/Home Management : 8-22 mins  Marcena Dias H. OTR/L Supplemental OT, Department of rehab services [phone]   Chrysa Rampy R H. 01/10/2021, 1:27 PM

## 2021-01-10 NOTE — Progress Notes (Signed)
Pharmacy Antibiotic Note  Donald Harrington is a 65 y.o. male admitted on 01/03/2021 with strokes  Pharmacy has been consulted for gentamicin dosing.  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 Will monitor trough levels, goal <1  3/25 AM update:  Gentamicin trough is <0.5, no changes needed    Plan: Cont Gentamicin 200mg  IV q24h Penicillin 12 million units q12h Continue to monitor Scr Re-check gent trough as needed  Height: 5' 10.5" (179.1 cm) Weight: 79.8 kg (175 lb 14.8 oz) IBW/kg (Calculated) : 74.15  Temp (24hrs), Avg:98.4 F (36.9 C), Min:97.8 F (36.6 C), Max:99 F (37.2 C)  Recent Labs  Lab 01/04/21 0322 01/05/21 0320 01/06/21 0410 01/07/21 0323 01/08/21 0640 01/09/21 2206  WBC 3.5* 4.1 3.3* 3.7* 4.0  --   CREATININE 1.29* 1.11 1.06 1.01 0.93  --   GENTTROUGH  --   --   --   --   --  <0.5*    Estimated Creatinine Clearance: 82 mL/min (by C-G formula based on SCr of 0.93 mg/dL).    No Known Allergies  Antimicrobials this admission: Ceftriaxone 3/20 >>3/22 Penicillin 3/22>> Gentamicin 3/21>>  Microbiology results: 3/19 BCx: strep gordonii  Narda Bonds, PharmD, BCPS Clinical Pharmacist Phone: [phone]

## 2021-01-10 NOTE — Progress Notes (Signed)
Inpatient Rehab Admissions Coordinator:   Awaiting determination from [facility] on CIR auth.  Will f/u with patient on Monday.   Shann Medal, PT, DPT Admissions Coordinator [phone] 01/10/21  5:03 PM

## 2021-01-10 NOTE — Progress Notes (Signed)
Patient bleeding from new PICC at insertion site.  Pressure dressing applied.  Will continue to [employer]

## 2021-01-10 NOTE — Progress Notes (Signed)
ANTICOAGULATION CONSULT NOTE - Follow-Up Consult  Pharmacy Consult for Heparin >> Apixaban Indication: atrial fibrillation and stroke  No Known Allergies  Patient Measurements: Height: 5' 10.5" (179.1 cm) Weight: 82.8 kg (182 lb 8.7 oz) IBW/kg (Calculated) : 74.15 Heparin Dosing Weight: 74 kg  Vital Signs: Temp: 98.2 F (36.8 C) (03/25 0753) Temp Source: Oral (03/25 0753) BP: 117/83 (03/25 0753) Pulse Rate: 69 (03/25 0753)  Labs: Recent Labs    01/08/21 0640 01/08/21 1920 01/09/21 1349 01/09/21 2206 01/10/21 0325  HGB 9.1*  --   --   --  9.0*  HCT 27.4*  --   --   --  28.2*  PLT 145*  --   --   --  156  HEPARINUNFRC 0.15*   < > 0.18* 0.24* 0.40  CREATININE 0.93  --   --   --   --    < > = values in this interval not displayed.    Estimated Creatinine Clearance: 82 mL/min (by C-G formula based on SCr of 0.93 mg/dL).  Assessment: 58 YOM on warfarin PTA for hx Afib, prior CVA who presented on 3/18 with new strokes on MRI, concern for GIB. The patient is s/p EGD on 3/20 which showed erosive gastropathy and non-bleeding duodenal ulcer. GI okayed restart of anticoagulation. Pharmacy consulted to start Heparin for bridging - no bolus and low goal with CVA + GIB eval. The MD does not plan to resume Warfarin at this time as thinks the patient would likely benefit from transition to Apixaban.   Earlier this week the L-AC IV site was reviewed with the RN and was noted to have some puffiness and was not drawing back blood well. Switched Heparin to alternative IV site on R-arm - now in dedicated RUA IV-site.   Heparin level this morning was therapeutic (HL 0.4, goal of 0.3-0.5) however now with plans to transition to Apixaban - confirmed with MD no dental extractions planned this admission. Dose of 5 mg bid appropriate for age/wt/renal fxn.   Goal of Therapy:  Heparin level 0.3-0.5 units/ml Monitor platelets by anticoagulation protocol: Yes   Plan:  - D/c Heparin and levels -  Start Apixaban 5 mg bid - first dose now - AVS placed in chart, will plan to educate prior to discharge - Pharmacy will sign off protocol but continue to monitor peripherally to monitor for signs/symptoms of bleeding and appropriate dosing.   Thank you for allowing pharmacy to be a part of this patients care.  Alycia Rossetti, PharmD, BCPS Clinical Pharmacist Clinical phone for 01/10/2021: [phone] 01/10/2021 8:35 AM   **Pharmacist phone directory can now be found on [email or web address] ([email or web address]).  Listed under [place].

## 2021-01-10 NOTE — Progress Notes (Signed)
Peripherally Inserted Central Catheter Placement  The IV Nurse has discussed with the patient and/or persons authorized to consent for the patient, the purpose of this procedure and the potential benefits and risks involved with this procedure.  The benefits include less needle sticks, lab draws from the catheter, and the patient may be discharged home with the catheter. Risks include, but not limited to, infection, bleeding, blood clot (thrombus formation), and puncture of an artery; nerve damage and irregular heartbeat and possibility to perform a PICC exchange if needed/ordered by physician.  Alternatives to this procedure were also discussed.  Bard Power PICC patient education guide, fact sheet on infection prevention and patient information card has been provided to patient /or left at bedside.    PICC Placement Documentation  PICC Single Lumen 01/10/21 PICC Right Brachial 38 cm 0 cm (Active)  Indication for Insertion or Continuance of Line Prolonged intravenous therapies 01/10/21 1401  Exposed Catheter (cm) 0 cm 01/10/21 1401  Site Assessment Clean;Dry;Intact 01/10/21 1401  Line Status Flushed;Blood return noted;Saline locked 01/10/21 1401  Dressing Type Transparent 01/10/21 1401  Dressing Status Clean;Dry;Intact 01/10/21 1401  Antimicrobial disc in place? Yes 01/10/21 1401  Dressing Change Due 01/17/21 01/10/21 1401       Donald Harrington 01/10/2021, 2:03 PM

## 2021-01-10 NOTE — Progress Notes (Signed)
[facility] for Infect[facility]  Date of Admission:  01/03/2021      Total days of antibiotics 5  Penicillin G 3/22 >> current   Gentamicin 3/21 >> current            ASSESSMENT: Donald Harrington is a 65 y.o. male with GI bleed/anemia, headache with new acute bilateral embolic strokes, neck pain and bacteremia due to streptococcus gordonii. 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 This was negative for vegetations, negative for valve dysfunction. It is possible that the CVAs are due more to poor adherence to anticoagulation, however given strep in the blood hard to prove these do not represent septic emboli. Would favor to continue the PV endocarditis treatment plan with 2 weeks of gent and IV Penicillin.  Dentistry has seen patient and planning for extraction this admission if able to be coordinated.     Repeat MRI of c-spine to ensure there is no discitis here given ongoing neck pain is pending. 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   Repeat blood cultures are negative as of 01/07/21      PLAN: 1. Continue IV penicillin + gentamicin (2 weeks) followed by 4 additional weeks of penicillin alone for 6 weeks total 2. Repeat C spine MRI w/ contrast to ensure no discitis/abscess given bacteremia.  3. Dentistry eval appreciated.  Agree that tooth extraction is best given that his bacteremia was from an odontogenic source 4. Place PICC  Dr Baxter Flattery available as needed over the weekend.  Otherwise Dr Linus Salmons or Werner Lean will be back on Monday    Principal Problem:   Bacteremia due to Streptococcus Active Problems:   Acute embolic stroke [facility])   GI bleed   Acute blood loss anemia   Cough   PAF (paroxysmal atrial fibrillation) (HCC)   History of aortic valve replacement   History of mitral valve replacement   Duodenal ulcer   Acute  gastric erosion   Dental caries   Chronic apical periodontitis   Chronic periodontal disease   . sodium chloride   Intravenous Once  . apixaban  5 mg Oral BID  . melatonin  5 mg Oral QHS  . metoprolol tartrate  25 mg Oral BID  . pantoprazole  40 mg Intravenous Q12H    SUBJECTIVE: Still has neck pain but using a heat pad for now No fevers, chills No n/v/d Tolerating abx   No Known Allergies   OBJECTIVE: Vitals:   01/10/21 0400 01/10/21 0500 01/10/21 0753 01/10/21 0907  BP: 123/64  117/83 105/70  Pulse: 72  69 85  Resp: (!) 25  14   Temp: 98.2 F (36.8 C)  98.2 F (36.8 C)   TempSrc: Oral  Oral   SpO2: 96%     Weight:  82.8 kg    Height:       Body mass index is 25.82 kg/m.  Physical Exam Vitals reviewed.  Constitutional:      Appearance: Normal appearance. He is not ill-appearing.  HENT:     Mouth/Throat:     Comments: Poor dentition Eyes:     General: No scleral icterus.    Extraocular Movements: Extraocular movements intact.  Pulmonary:     Effort: Pulmonary effort is normal. No respiratory distress.  Musculoskeletal:        General: Normal range of motion.  Skin:  General: Skin is warm and dry.  Neurological:     General: No focal deficit present.     Mental Status: He is alert and oriented to person, place, and time. Mental status is at baseline.  Psychiatric:        Mood and Affect: Mood normal.        Behavior: Behavior normal.     Lab Results Lab Results  Component Value Date   WBC 4.9 01/10/2021   HGB 9.0 (L) 01/10/2021   HCT 28.2 (L) 01/10/2021   MCV 104.4 (H) 01/10/2021   PLT 156 01/10/2021    Lab Results  Component Value Date   CREATININE 1.11 01/10/2021   BUN 9 01/10/2021   NA 137 01/10/2021   K 4.8 01/10/2021   CL 107 01/10/2021   CO2 27 01/10/2021    Lab Results  Component Value Date   ALT 9 01/08/2021   AST 12 (L) 01/08/2021   ALKPHOS 38 01/08/2021   BILITOT 0.6 01/08/2021     Microbiology: BCx 3/19 >> Strep  gordonii 3/4 bottles (PCN MIC < 0.06) BCx 3/22 >. Neg, prelim     Rayn[facility] 01/10/2021, 11:47 AM

## 2021-01-10 NOTE — PMR Pre-admission (Incomplete)
PMR Admission Coordinator Pre-Admission Assessment  Patient: Donald Harrington is an 65 y.o., male MRN: [id number] DOB: [birth year] Height: 5' 10.5" (179.1 cm) Weight: 82.8 kg  Insurance Information HMO:     PPO: yes     PCP:      IPA:      80/20:      OTHER:  PRIMARY: [employer]      Policy#: [id number]      Subscriber: *** CM Name: ***      Phone#: ***     Fax#: *** Pre-Cert#: ***      Employer: *** Benefits:  Phone #: ***     Name: *** Irene Shipper. Date: ***     Deduct: ***      Out of Pocket Max: ***      Life Max: *** CIR: ***      SNF: *** Outpatient: ***     Co-Pay: *** Home Health: ***      Co-Pay: *** DME: ***     Co-Pay: *** Providers: *** SECONDARY: ***      Policy#: ***     Phone#: ***  Financial Counselor: ***      Phone#: ***  The "Data Collection Information Summary" for patients in Inpatient Rehabilitation Facilities with attached "Privacy Act [place] Records" was provided and verbally reviewed with: {CHL IP Patient Family GU:542706237}  Emergency Contact Information Contact Information    Name Relation Home Work Mobile   [place] Daughter   [phone]      Current Medical History  Patient Admitting Diagnosis: *** History of Present Illness: *** Complete NIHSS TOTAL: 1  Patient's medical record from *** has been reviewed by the rehabilitation admission coordinator and physician.  Past Medical History  Past Medical History:  Diagnosis Date  . Alcohol abuse   . Bladder cancer ([place])    remission s/p TURBT  . CAD (coronary artery disease), native coronary artery    MI in setting of endocarditis  . 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 Endocarditis    Mitral and Aortic valve 2015  . HTN (hypertension)   . Hypothyroidism   . NICM (nonischemic cardiomyopathy) ([place])    Normal EF as of 2020 echo  . PAF (paroxysmal atrial fibrillation) ([facility])    coumadin anticoagulation due to h/o valve replacements with bioprosthetic valves  . PUD  (peptic ulcer disease)    causing GIB in 2015    Family History   family history includes Thyroid disease in his sister.  Prior Rehab/Hospitalizations Has the patient had prior rehab or hospitalizations prior to admission? {Yes/No/Unknown:304600602}  Has the patient had major surgery during 100 days prior to admission? {Yes/No/Unknown:304600602}   Current Medications  Current Facility-Administered Medications:  .  0.9 %  sodium chloride infusion (Manually program via Guardrails IV Fluids), , Intravenous, Once, Nahser, Wonda Cheng, MD, Stopped at 01/05/21 2143 .  0.9 %  sodium chloride infusion, , Intravenous, Continuous, Nahser, Wonda Cheng, MD, Last Rate: 10 mL/hr at 01/08/21 1039, Restarted at 01/08/21 1056 .  0.9 %  sodium chloride infusion, , Intravenous, Continuous, [facility], Tami Lin, [place], Last Rate: 20 mL/hr at 01/10/21 0200, Infusion Verify at 01/10/21 0200 .  acetaminophen (TYLENOL) tablet 650 mg, 650 mg, Oral, Q6H PRN, 650 mg at 01/10/21 1520 **OR** acetaminophen (TYLENOL) suppository 650 mg, 650 mg, Rectal, Q6H PRN, Nahser, Wonda Cheng, MD .  apixaban (ELIQUIS) tablet 5 mg, 5 mg, Oral, BID, Rolla Flatten, RPH, 5 mg at 01/10/21 1053 .  Chlorhexidine Gluconate Cloth 2 % PADS 6 each, 6 each, Topical, Daily, [name], MD, 6 each at 01/10/21 1521 .  gentamicin (GARAMYCIN) 200 mg in dextrose 5 % 50 mL IVPB, 200 mg, Intravenous, Q24H, Nahser, Wonda Cheng, MD, Last Rate: 110 mL/hr at 01/10/21 0200, Infusion Verify at 01/10/21 0200 .  melatonin tablet 5 mg, 5 mg, Oral, QHS, Nahser, Wonda Cheng, MD, 5 mg at 01/09/21 2219 .  metoprolol tartrate (LOPRESSOR) tablet 25 mg, 25 mg, Oral, BID, Nahser, Wonda Cheng, MD, 25 mg at 01/10/21 0932 .  Muscle Rub CREA, , Topical, PRN, Nahser, Wonda Cheng, MD, Given at 01/10/21 [phone] .  ondansetron (ZOFRAN) tablet 4 mg, 4 mg, Oral, Q6H PRN **OR** ondansetron (ZOFRAN) injection 4 mg, 4 mg, Intravenous, Q6H PRN, Nahser, Wonda Cheng, MD .  pantoprazole (PROTONIX)  injection 40 mg, 40 mg, Intravenous, Q12H, Nahser, Wonda Cheng, MD, 40 mg at 01/10/21 0907 .  penicillin G potassium 12 Million Units in dextrose 5 % 500 mL continuous infusion, 12 Million Units, Intravenous, Q12H, Nahser, Wonda Cheng, MD, Last Rate: 41.7 mL/hr at 01/10/21 0548, 12 Million Units at 01/10/21 0548 .  sodium chloride flush (NS) 0.9 % injection 10-40 mL, 10-40 mL, Intracatheter, PRN, [name], MD .  traMADol Veatrice Bourbon) tablet 50 mg, 50 mg, Oral, Q12H PRN, Nahser, Wonda Cheng, MD, 50 mg at 01/10/21 0907  Patients Current Diet:  Diet Order            Diet Heart Room service appropriate? Yes; Fluid consistency: Thin  Diet effective now                 Precautions / Restrictions Precautions Precautions: Fall Precaution Comments: HOH; HR < 130 (return to bed if >130) Restrictions Weight Bearing Restrictions: No   Has the patient had 2 or more falls or a fall with injury in the past year? {Yes/No/Unknown:304600602}  Prior Activity Level Limited Community (1-2x/wk): independent, was driving short distances (up to 20 minutes, though apparently was told by his PCP he should not be driving), no DME used in the house but [facility] in the community  Prior Functional Level Self Care: Did the patient need help bathing, dressing, using the toilet or eating? {Prior Functional DUKGU:542706237}  Indoor Mobility: Did the patient need assistance with walking from room to room (with or without device)? {Prior Functional SEGBT:517616073}  Stairs: Did the patient need assistance with internal or external stairs (with or without device)? {Prior Functional XTGGY:694854627}  Functional Cognition: Did the patient need help planning regular tasks such as shopping or remembering to take medications? {Prior Functional OJJKK:938182993}  Home Assistive Devices / Equipment Home Equipment: Cane - single point,Walker - 2 wheels  Prior Device Use: Indicate devices/aids used by the patient prior to current  illness, exacerbation or injury? {Prior Device ZJIR:678938101}  Current Functional Level Cognition  Arousal/Alertness: Awake/alert Overall Cognitive Status: Impaired/Different from baseline Orientation Level: Oriented X4 Safety/Judgement: Decreased awareness of safety General Comments: Patient with low frustration tolerance and quick mood changes. Fixates on situations and placement of objects in room. Requires thorough explanation of plan before initiation of movement. Patient with decreased awareness of safety refusing use of RW but wanting to hold SPC in one hand and IV pole in the other. Doesn't realize that this would present at fall risk. Attention: Sustained Sustained Attention: Appears intact Memory:  (difficult to assess) Behaviors: Poor frustration tolerance Safety/Judgment: Appears intact    Extremity Assessment (includes Sensation/Coordination)  Upper Extremity Assessment: LUE deficits/detail LUE  Deficits / Details: Reports numbness at fingertips. Poor FM coorindation. Poor grasp and overall strength LUE Coordination: decreased fine motor,decreased gross motor  Lower Extremity Assessment: Defer to PT evaluation RLE Deficits / Details: MMT scores of the following: hip flexion 4+, knee extension 5, knee flexion 4, ankle dorsiflexion 5 RLE Sensation:  (denies numbness/tingling) RLE Coordination: decreased gross motor LLE Deficits / Details: MMT scores of the following: hip flexion 4, knee extension 5, knee flexion 4-, ankle dorsiflexion 5 LLE Sensation:  (denies numbness/tingling) LLE Coordination: decreased gross motor (slower, more purposeful movements than R with foot tapping and heel-shin rub)    ADLs  Overall ADL's : Needs assistance/impaired Eating/Feeding: Set up,Supervision/ safety,Sitting Grooming: Min guard,Standing Grooming Details (indicate cue type and reason): Hand washing standing at sink level with Min guard for steadying. Upper Body Bathing: Min  guard,Sitting Lower Body Bathing: Min guard,Sit to/from stand Upper Body Dressing : Min guard,Sitting Lower Body Dressing: Min guard,Sit to/from stand Lower Body Dressing Details (indicate cue type and reason): Able to don pants in sitting/standing with Min guard for safety. Increased time/effort to thread BLE. Toilet Transfer: Glass blower/designer Details (indicate cue type and reason): RW over standard commode in bathroom. Toileting- Water quality scientist and Hygiene: Min guard,Sit to/from stand Toileting - Water quality scientist Details (indicate cue type and reason): Min guard for steadying/safety. Functional mobility during ADLs: Min guard,Minimal assistance,Rolling walker General ADL Comments: Pt presenting with decreased balance, strength, and functional use of LUE.    Mobility  Overal bed mobility: Modified Independent General bed mobility comments: Supine <> EOB with Mod I.    Transfers  Overall transfer level: Needs assistance Equipment used: Rolling walker (2 wheeled),1 person hand held assist Transfers: Sit to/from Stand Sit to Stand: Min guard General transfer comment: Min guard for steadying/safety.    Ambulation / Gait / Stairs / Wheelchair Mobility  Ambulation/Gait Ambulation/Gait assistance: Herbalist (Feet): 300 Feet Assistive device: Straight cane Gait Pattern/deviations: Step-through pattern,Decreased stride length,Drifts right/left,Wide base of support General Gait Details: Wide BOS throughout. x1 anterior LOB with minA to recover. Adamant about using SPC for ambulation Gait velocity: decreased Gait velocity interpretation: <1.8 ft/sec, indicate of risk for recurrent falls    Posture / Balance Balance Overall balance assessment: Needs assistance Sitting-balance support: No upper extremity supported,Feet supported Sitting balance-Leahy Scale: Good Standing balance support: Bilateral upper extremity supported Standing  balance-Leahy Scale: Poor Standing balance comment: Reliant on at least unilateral UE support with mobility.    Special needs/care consideration {Special Care Needs/Care Considerations:304600603}   Previous Home Environment (from acute therapy documentation) Living Arrangements: Alone  Lives With: Alone Available Help at Discharge:  (no friends or family nearby) Type of Home: Apartment Home Layout: One level Home Access: Level entry Bathroom Shower/Tub: Multimedia programmer: Standard Additional Comments: Reports apartment is handicap accessible.  Discharge Living Setting Plans for Discharge Living Setting: Patient's home Type of Home at Discharge: Apartment Discharge Home Layout: One level Discharge Home Access: Level entry Discharge Bathroom Shower/Tub: Walk-in shower Discharge Bathroom Toilet: Standard Discharge Bathroom Accessibility: Yes How Accessible: Accessible via walker,Accessible via wheelchair Does the patient have any problems obtaining your medications?: No  Social/Family/Support Systems Anticipated Caregiver: pt does not have anyone to provide assist at discharge.  He reports he could call a neighbor in an emergency. Caregiver Availability: Other (Comment) (no support)  Goals Patient/Family Goal for Rehab: PT/OT mod I, SLP n/a Expected length of stay: 9-12 days Pt/Family Agrees to Admission and willing to  participate: Yes Program Orientation Provided & Reviewed with Pt/Caregiver Including Roles  & Responsibilities: Yes  Barriers to Discharge: Lack of/limited family support,Insurance for SNF coverage,Home environment access/layout  Decrease burden of Care through IP rehab admission: {Inpatient Rehab Care:20780}  Possible need for SNF placement upon discharge: ***  Patient Condition: {PATIENT'S CONDITION:22832}  Preadmission Screen Completed By:  Donald Harrington, 01/10/2021 5:07  PM ______________________________________________________________________   Discussed status with Dr. Marland Kitchen on *** at *** and received approval for admission today.  Admission Coordinator:  Donald Harrington, PT, time Marland KitchenSudie Grumbling ***   Assessment/Plan: Diagnosis: 1. Does the need for close, 24 hr/day Medical supervision in concert with the patient's rehab needs make it unreasonable for this patient to be served in a less intensive setting? {yes_no_potentially:3041433} 2. Co-Morbidities requiring supervision/potential complications: *** 3. Due to {due LF:8101751}, does the patient require 24 hr/day rehab nursing? {yes_no_potentially:3041433} 4. Does the patient require coordinated care of a physician, rehab nurse, PT, OT, and SLP to address physical and functional deficits in the context of the above medical diagnosis(es)? {yes_no_potentially:3041433} Addressing deficits in the following areas: {deficits:3041436} 5. Can the patient actively participate in an intensive therapy program of at least 3 hrs of therapy 5 days a week? {yes_no_potentially:3041433} 6. The potential for patient to make measurable gains while on inpatient rehab is {potential:3041437} 7. Anticipated functional outcomes upon discharge from inpatient rehab: {functional outcomes:304600100} PT, {functional outcomes:304600100} OT, {functional outcomes:304600100} SLP 8. Estimated rehab length of stay to reach the above functional goals is: *** 9. Anticipated discharge destination: {anticipated dc setting:21604} 10. Overall Rehab/Functional Prognosis: {potential:3041437}   MD Signature: ***

## 2021-01-10 NOTE — Progress Notes (Signed)
[facility] PROGRESS NOTE    Progress Note  [name]  NWG:[id number] DOB: [birth year] DOA: 01/03/2021 PCP: Patient, No Pcp Per     Brief Narrative:   Donald Harrington is an 65 y.o. 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  Reports no abdominal pain nausea or vomiting black stools or red stools.  He is found to be tachycardic blood cultures were sent which were positive for Streptococcus ID was consulted  Significant events: EGD on 01/06/2019 due to shortness of gastritis, duodenal ulcer but no evidence of bleeding. Status post 2 units packed red blood cells 01/08/2019 orthopantogram multiple dental caries with residual dentition lucency within the maxilla related to absent tooth 10 which may represent an residual periapical abscess. 01/06/2021 2D echocardiogram showed poor acoustic window EF of 55 MV bioprosthetic valve not well visualized with a mean gradient of 9 mmHg.  AV bioprosthetic valve not well visualized with a mean gradient of 30 mmHg  Antibiotics: 3.21.2022 rocephin 3.21.2022 Gentamacin  3.22.2022 PCN  Microbiology data: 01/04/2021 blood culture: Streptococcus Gondi 01/07/2021 blood cultures negative till date.  Procedures: 01/08/2021 TEE no vegetation  Assessment/Plan:   Acute blood loss anemia/upper GI bleed: He reports taking Goody powder for neck pain. Status post 2 units of packed red blood cells hemoglobin has improved.  No signs of further bleeding. Hemoglobin has remained stable.  Acute multifocal embolic strokes: Likely in the setting of subtherapeutic INR, history of A. fib versus septic emboli/given bacteremia. Continue heparin for  now. Etiology of his embolic stroke remains uncertain, question septic versus subtherapeutic INR in the setting of paroxysmal atrial fibrillation.  Sepsis due to Streptococcus bacteremia/concern for endocarditis/in the setting of periodontal disease: Sepsis physiology on 01/05/2021, now resolved. He will need 2 weeks of IV gentamicin and penicillin followed by 4 weeks of penicillin alone. Dentist have been consulted awaiting further recommendation.  Paroxysmal atrial fibrillation: In sinus rhythm we will leave on IV heparin until planned by dentist have been developed.  Essential hypertension: Holding antihypertensive medication as he was hypotensive and septic on admission.    DVT prophylaxis: heparin Family Communication:none Status is: Inpatient  Remains inpatient appropriate because:Hemodynamically unstable   Dispo: The patient is from: Home              Anticipated d/c is to: SNF              Patient currently is not medically stable to d/c.   Difficult to place patient No    Code Status:     Code Status Orders  (From admission, onward)         Start     Ordered   01/03/21 2051  Full code  Continuous        01/03/21 2052        Code Status History    This patient has a current code status but no historical code status.   Advance Care Planning Activity        IV Access:    Peripheral IV   Procedures and diagnostic studies:   ECHO TEE  Result Date: 01/08/2021    TRANSESOPHOGEAL ECHO REPORT   Patient Name:   Donald Harrington Juste Date of Exam:  01/08/2021 Medical Rec #:  [id number]    Height:       70.5 in Accession #:    [id number]   Weight:       175.9 lb Date of Birth:  01/28/1956    BSA:          1.987 m Patient Age:    50 years     BP:           86/59 mmHg Patient Gender: M            HR:           91 bpm. Exam Location:  Inpatient Procedure: Transesophageal Echo, Cardiac Doppler and Color Doppler Indications:     Bacteremia  History:         Patient has prior  history of Echocardiogram examinations, most                  recent 01/06/2021. Previous Myocardial Infarction, Stroke,                  Endocarditis and AVR. MVR, Arrythmias:Atrial Fibrillation,                  Signs/Symptoms:Bacteremia; Risk Factors:Hypertension.  Sonographer:     Dustin Flock Referring Phys:  [id number] Tami Lin [facility] Diagnosing Phys: Mertie Moores MD PROCEDURE: The transesophogeal probe was passed without difficulty through the esophogus of the patient. Sedation performed by performing physician. The patient was monitored while under deep sedation. Anesthestetic sedation was provided intravenously by  Anesthesiology: 233.59mg  of Propofol, 80mg  of Lidocaine. The patient developed no complications during the procedure. IMPRESSIONS  1. Left ventricular ejection fraction, by estimation, is 55 to 60%. The left ventricle has normal function.  2. Right ventricular systolic function is normal. The right ventricular size is normal.  3. No left atrial/left atrial appendage thrombus was detected.  4. The mitral valve has been repaired/replaced. No evidence of mitral valve regurgitation. No evidence of mitral stenosis.  5. The tricuspid valve is abnormal. Tricuspid valve regurgitation is moderate.  6. The aortic valve has been repaired/replaced. Aortic valve regurgitation is not visualized. No aortic stenosis is present. FINDINGS  Left Ventricle: Left ventricular ejection fraction, by estimation, is 55 to 60%. The left ventricle has normal function. The left ventricular internal cavity size was normal in size. Right Ventricle: The right ventricular size is normal. No increase in right ventricular wall thickness. Right ventricular systolic function is normal. Left Atrium: Left atrial size was normal in size. No left atrial/left atrial appendage thrombus was detected. Right Atrium: Right atrial size was normal in size. Pericardium: There is no evidence of pericardial effusion. Mitral Valve: The mitral  valve has been repaired/replaced. No evidence of mitral valve regurgitation. There is a bioprosthetic valve present in the mitral position. No evidence of mitral valve stenosis. There is no evidence of mitral valve vegetation. Tricuspid Valve: The tricuspid valve is abnormal. Tricuspid valve regurgitation is moderate. Aortic Valve: The aortic valve has been repaired/replaced. Aortic valve regurgitation is not visualized. No aortic stenosis is present. There is a bioprosthetic valve present in the aortic position. There is no evidence of aortic valve vegetation. Pulmonic Valve: The pulmonic valve was grossly normal. Pulmonic valve regurgitation is trivial. Aorta: The aortic root and ascending aorta are structurally normal, with no evidence of dilitation. There is minimal (Grade I) plaque. IAS/Shunts: No atrial level shunt detected by color flow Doppler. Mertie Moores MD Electronically signed by Mertie Moores MD Signature Date/Time: 01/08/2021/3:13:00  PM    Final      Medical Consultants:    None.   Subjective:    Hagan Vanauken relates he continues to feel better today.  Objective:    Vitals:   01/10/21 0000 01/10/21 0400 01/10/21 0500 01/10/21 0753  BP: 110/79 123/64  117/83  Pulse: 68 72  69  Resp: 15 (!) 25  14  Temp: 98.3 F (36.8 C) 98.2 F (36.8 C)  98.2 F (36.8 C)  TempSrc: Oral Oral  Oral  SpO2: 95% 96%    Weight:   82.8 kg   Height:       SpO2: 96 % O2 Flow Rate (L/min): 3 L/min FiO2 (%): 28 %   Intake/Output Summary (Last 24 hours) at 01/10/2021 0837 Last data filed at 01/10/2021 7893 Gross per 24 hour  Intake 1345.1 ml  Output 1900 ml  Net -554.9 ml   Filed Weights   01/05/21 0912 01/08/21 0311 01/10/21 0500  Weight: 74.8 kg 79.8 kg 82.8 kg    Exam: General exam: In no acute distress. Respiratory system: Good air movement and clear to auscultation. Cardiovascular system: S1 & S2 heard, RRR. No JVD. Gastrointestinal system: Abdomen is nondistended, soft and  nontender.  Extremities: No pedal edema. Skin: No rashes, lesions or ulcers Psychiatry: Judgement and insight appear normal. Mood & affect appropriate.  Data Reviewed:    Labs: Basic Metabolic Panel: Recent Labs  Lab 01/04/21 0322 01/05/21 0320 01/06/21 0410 01/07/21 0323 01/08/21 0640  NA 137 137 135 139 138  K 4.5 3.6 3.6 4.0 4.2  CL 103 105 105 109 107  CO2 24 21* 23 23 27   GLUCOSE 97 90 96 103* 92  BUN 15 15 15 12 10   CREATININE 1.29* 1.11 1.06 1.01 0.93  CALCIUM 8.1* 7.8* 7.7* 7.7* 8.0*  MG  --  2.0  --   --   --   PHOS  --  3.0  --   --   --    GFR Estimated Creatinine Clearance: 82 mL/min (by C-G formula based on SCr of 0.93 mg/dL). Liver Function Tests: Recent Labs  Lab 01/04/21 0322 01/05/21 0320 01/07/21 0323 01/08/21 0640  AST 17 14* 12* 12*  ALT 17 14 11 9   ALKPHOS 42 38 39 38  BILITOT 1.2 0.9 0.4 0.6  PROT 5.2* 4.9* 4.8* 4.9*  ALBUMIN 2.4* 2.3* 2.0* 2.1*   No results for input(s): LIPASE, AMYLASE in the last 168 hours. No results for input(s): AMMONIA in the last 168 hours. Coagulation profile Recent Labs  Lab 01/05/21 0320  INR 1.5*   COVID-19 Labs  No results for input(s): DDIMER, FERRITIN, LDH, CRP in the last 72 hours.  Lab Results  Component Value Date   [place] NEGATIVE 01/05/2021    CBC: Recent Labs  Lab 01/05/21 0320 01/06/21 0410 01/07/21 0323 01/08/21 0640 01/10/21 0325  WBC 4.1 3.3* 3.7* 4.0 4.9  HGB 7.6* 8.2* 8.8* 9.1* 9.0*  HCT 23.2* 25.7* 26.7* 27.4* 28.2*  MCV 102.2* 102.0* 101.1* 101.1* 104.4*  PLT 124* 120* 125* 145* 156   Cardiac Enzymes: No results for input(s): CKTOTAL, CKMB, CKMBINDEX, TROPONINI in the last 168 hours. BNP (last 3 results) No results for input(s): PROBNP in the last 8760 hours. CBG: No results for input(s): GLUCAP in the last 168 hours. D-Dimer: No results for input(s): DDIMER in the last 72 hours. Hgb A1c: No results for input(s): HGBA1C in the last 72 hours. Lipid Profile: No  results for input(s): CHOL, HDL,  LDLCALC, TRIG, CHOLHDL, LDLDIRECT in the last 72 hours. Thyroid function studies: No results for input(s): TSH, T4TOTAL, T3FREE, THYROIDAB in the last 72 hours.  Invalid input(s): FREET3 Anemia work up: No results for input(s): VITAMINB12, FOLATE, FERRITIN, TIBC, IRON, RETICCTPCT in the last 72 hours. Sepsis Labs: Recent Labs  Lab 01/06/21 0410 01/07/21 0323 01/08/21 0640 01/10/21 0325  WBC 3.3* 3.7* 4.0 4.9   Microbiology Recent Results (from the past 240 hour(s))  Culture, blood (Routine X 2) w Reflex to ID Panel     Status: Abnormal   Collection Time: 01/04/21  3:04 PM   Specimen: BLOOD LEFT HAND  Result Value Ref Range Status   Specimen Description BLOOD LEFT HAND  Final   Special Requests   Final    BOTTLES DRAWN AEROBIC AND ANAEROBIC Blood Culture results may not be optimal due to an inadequate volume of blood received in culture bottles   Culture  Setup Time   Final    GRAM POSITIVE COCCI IN CHAINS IN BOTH AEROBIC AND ANAEROBIC BOTTLES CRITICAL RESULT CALLED TO, READ BACK BY AND VERIFIED WITH: J,MILLEN PHARMD @0851  01/05/21 EB Performed at [facility], [place] [address], [place], [place] [address]    Culture STREPTOCOCCUS GORDONII (A)  Final   Report Status 01/07/2021 FINAL  Final   Organism ID, Bacteria STREPTOCOCCUS GORDONII  Final      Susceptibility   Streptococcus gordonii - MIC*    PENICILLIN <=0.06 SENSITIVE Sensitive     CEFTRIAXONE <=0.12 SENSITIVE Sensitive     ERYTHROMYCIN 2 RESISTANT Resistant     LEVOFLOXACIN 1 SENSITIVE Sensitive     VANCOMYCIN 0.5 SENSITIVE Sensitive     * STREPTOCOCCUS GORDONII  Blood Culture ID Panel (Reflexed)     Status: Abnormal   Collection Time: 01/04/21  3:04 PM  Result Value Ref Range Status   Enterococcus faecalis NOT DETECTED NOT DETECTED Final   Enterococcus Faecium NOT DETECTED NOT DETECTED Final   Listeria monocytogenes NOT DETECTED NOT DETECTED Final   Staphylococcus species NOT  DETECTED NOT DETECTED Final   Staphylococcus aureus (BCID) NOT DETECTED NOT DETECTED Final   Staphylococcus epidermidis NOT DETECTED NOT DETECTED Final   Staphylococcus lugdunensis NOT DETECTED NOT DETECTED Final   Streptococcus species DETECTED (A) NOT DETECTED Final    Comment: Not Enterococcus species, Streptococcus agalactiae, Streptococcus pyogenes, or Streptococcus pneumoniae. CRITICAL RESULT CALLED TO, READ BACK BY AND VERIFIED WITH: J,MILLEN PHARMD @0851  01/05/21 EB    Streptococcus agalactiae NOT DETECTED NOT DETECTED Final   Streptococcus pneumoniae NOT DETECTED NOT DETECTED Final   Streptococcus pyogenes NOT DETECTED NOT DETECTED Final   A.calcoaceticus-baumannii NOT DETECTED NOT DETECTED Final   Bacteroides fragilis NOT DETECTED NOT DETECTED Final   Enterobacterales NOT DETECTED NOT DETECTED Final   Enterobacter cloacae complex NOT DETECTED NOT DETECTED Final   Escherichia coli NOT DETECTED NOT DETECTED Final   Klebsiella aerogenes NOT DETECTED NOT DETECTED Final   Klebsiella oxytoca NOT DETECTED NOT DETECTED Final   Klebsiella pneumoniae NOT DETECTED NOT DETECTED Final   Proteus species NOT DETECTED NOT DETECTED Final   Salmonella species NOT DETECTED NOT DETECTED Final   Serratia marcescens NOT DETECTED NOT DETECTED Final   Haemophilus influenzae NOT DETECTED NOT DETECTED Final   Neisseria meningitidis NOT DETECTED NOT DETECTED Final   Pseudomonas aeruginosa NOT DETECTED NOT DETECTED Final   Stenotrophomonas maltophilia NOT DETECTED NOT DETECTED Final   Candida albicans NOT DETECTED NOT DETECTED Final   Candida auris NOT DETECTED NOT DETECTED Final  Candida glabrata NOT DETECTED NOT DETECTED Final   Candida krusei NOT DETECTED NOT DETECTED Final   Candida parapsilosis NOT DETECTED NOT DETECTED Final   Candida tropicalis NOT DETECTED NOT DETECTED Final   Cryptococcus neoformans/gattii NOT DETECTED NOT DETECTED Final    Comment: Performed at [facility], [place] [address], [place], [place] [address]  Culture, blood (Routine X 2) w Reflex to ID Panel     Status: Abnormal   Collection Time: 01/04/21  3:11 PM   Specimen: BLOOD LEFT HAND  Result Value Ref Range Status   Specimen Description BLOOD LEFT HAND  Final   Special Requests   Final    BOTTLES DRAWN AEROBIC ONLY Blood Culture adequate volume   Culture  Setup Time   Final    GRAM POSITIVE COCCI IN CHAINS AEROBIC BOTTLE ONLY CRITICAL VALUE NOTED.  VALUE IS CONSISTENT WITH PREVIOUSLY REPORTED AND CALLED VALUE.    Culture (A)  Final    STREPTOCOCCUS GORDONII SUSCEPTIBILITIES PERFORMED ON PREVIOUS CULTURE WITHIN THE LAST 5 DAYS. Performed at [facility], [place] [address] [place], [place] [address]    Report Status 01/07/2021 FINAL  Final  Resp Panel by RT-PCR (Flu A&B, Covid) Nasopharyngeal Swab     Status: None   Collection Time: 01/05/21  6:31 AM   Specimen: Nasopharyngeal Swab; Nasopharyngeal(NP) swabs in vial transport medium  Result Value Ref Range Status   SARS Coronavirus 2 by RT PCR NEGATIVE NEGATIVE Final    Comment: (NOTE) SARS-CoV-2 target nucleic acids are NOT DETECTED.  The SARS-CoV-2 RNA is generally detectable in upper respiratory specimens during the acute phase of infection. The lowest concentration of SARS-CoV-2 viral copies this assay can detect is 138 copies/mL. A negative result does not preclude SARS-Cov-2 infection and should not be used as the sole basis for treatment or other patient management decisions. A negative result may occur with  improper specimen collection/handling, submission of specimen other than nasopharyngeal swab, presence of viral mutation(s) within the areas targeted by this assay, and inadequate number of viral copies(<138 copies/mL). A negative result must be combined with clinical observations, patient history, and epidemiological information. The expected result is Negative.  Fact Sheet for Patients:   EntrepreneurPulse.com.au  Fact Sheet for Healthcare Providers:  IncredibleEmployment.be  This test is no t yet approved or cleared by the [place] FDA and  has been authorized for detection and/or diagnosis of SARS-CoV-2 by FDA under an Emergency Use Authorization (EUA). This EUA will remain  in effect (meaning this test can be used) for the duration of the COVID-19 declaration under Section 564(b)(1) of the Act, 21 U.S.C.section 360bbb-3(b)(1), unless the authorization is terminated  or revoked sooner.       Influenza A by PCR NEGATIVE NEGATIVE Final   Influenza B by PCR NEGATIVE NEGATIVE Final    Comment: (NOTE) The Xpert Xpress SARS-CoV-2/FLU/RSV plus assay is intended as an aid in the diagnosis of influenza from Nasopharyngeal swab specimens and should not be used as a sole basis for treatment. Nasal washings and aspirates are unacceptable for Xpert Xpress SARS-CoV-2/FLU/RSV testing.  Fact Sheet for Patients: EntrepreneurPulse.com.au  Fact Sheet for Healthcare Providers: IncredibleEmployment.be  This test is not yet approved or cleared by the [place] FDA and has been authorized for detection and/or diagnosis of SARS-CoV-2 by FDA under an Emergency Use Authorization (EUA). This EUA will remain in effect (meaning this test can be used) for the duration of the COVID-19 declaration under Section 564(b)(1) of  the Act, 21 U.S.C. section 360bbb-3(b)(1), unless the authorization is terminated or revoked.  Performed at [facility], [place] [address], [place], [place] [address]   Culture, blood (Routine X 2) w Reflex to ID Panel     Status: None (Preliminary result)   Collection Time: 01/07/21 11:24 AM   Specimen: BLOOD RIGHT FOREARM  Result Value Ref Range Status   Specimen Description BLOOD RIGHT FOREARM  Final   Special Requests   Final    BOTTLES DRAWN AEROBIC AND ANAEROBIC Blood  Culture results may not be optimal due to an inadequate volume of blood received in culture bottles   Culture   Final    NO GROWTH 3 DAYS Performed at [facility], [place] [address] [place], [place] [address]    Report Status PENDING  Incomplete  Culture, blood (Routine X 2) w Reflex to ID Panel     Status: None (Preliminary result)   Collection Time: 01/07/21 11:29 AM   Specimen: BLOOD RIGHT HAND  Result Value Ref Range Status   Specimen Description BLOOD RIGHT HAND  Final   Special Requests   Final    BOTTLES DRAWN AEROBIC AND ANAEROBIC Blood Culture results may not be optimal due to an inadequate volume of blood received in culture bottles   Culture   Final    NO GROWTH 3 DAYS Performed at [facility], [place] [address], [place], [place] [address]    Report Status PENDING  Incomplete     Medications:   . sodium chloride   Intravenous Once  . melatonin  5 mg Oral QHS  . metoprolol tartrate  25 mg Oral BID  . pantoprazole  40 mg Intravenous Q12H   Continuous Infusions: . sodium chloride 10 mL/hr at 01/08/21 1039  . sodium chloride 20 mL/hr at 01/10/21 0200  . gentamicin 110 mL/hr at 01/10/21 0200  . heparin 2,300 Units/hr (01/10/21 0200)  . penicillin g continuous IV infusion 12 Million Units (01/10/21 0548)      LOS: 7 days   [name]  [facility]  01/10/2021, 8:37 AM

## 2021-01-11 DIAGNOSIS — D62 Acute posthemorrhagic anemia: Secondary | ICD-10-CM | POA: Diagnosis not present

## 2021-01-11 DIAGNOSIS — R7881 Bacteremia: Secondary | ICD-10-CM | POA: Diagnosis not present

## 2021-01-11 DIAGNOSIS — B955 Unspecified streptococcus as the cause of diseases classified elsewhere: Secondary | ICD-10-CM | POA: Diagnosis not present

## 2021-01-11 LAB — CBC
HCT: 28.1 % — ABNORMAL LOW (ref 39.0–52.0)
Hemoglobin: 8.9 g/dL — ABNORMAL LOW (ref 13.0–17.0)
MCH: 32.6 pg (ref 26.0–34.0)
MCHC: 31.7 g/dL (ref 30.0–36.0)
MCV: 102.9 fL — ABNORMAL HIGH (ref 80.0–100.0)
Platelets: 164 10*3/uL (ref 150–400)
RBC: 2.73 MIL/uL — ABNORMAL LOW (ref 4.22–5.81)
RDW: 16.5 % — ABNORMAL HIGH (ref 11.5–15.5)
WBC: 4.6 10*3/uL (ref 4.0–10.5)
nRBC: 0 % (ref 0.0–0.2)

## 2021-01-11 MED ORDER — LEVOTHYROXINE SODIUM 25 MCG PO TABS
25.0000 ug | ORAL_TABLET | Freq: Every morning | ORAL | Status: DC
  Administered 2021-01-12 – 2021-01-20 (×9): 25 ug via ORAL
  Filled 2021-01-11 (×9): qty 1

## 2021-01-11 MED ORDER — SODIUM CHLORIDE 0.9 % IV BOLUS
500.0000 mL | Freq: Once | INTRAVENOUS | Status: AC
  Administered 2021-01-11: 500 mL via INTRAVENOUS

## 2021-01-11 MED ORDER — FUROSEMIDE 40 MG PO TABS
40.0000 mg | ORAL_TABLET | Freq: Every day | ORAL | Status: DC
  Administered 2021-01-11 – 2021-01-12 (×2): 40 mg via ORAL
  Filled 2021-01-11 (×2): qty 1

## 2021-01-11 MED ORDER — POTASSIUM CHLORIDE CRYS ER 10 MEQ PO TBCR
10.0000 meq | EXTENDED_RELEASE_TABLET | Freq: Every day | ORAL | Status: DC
  Administered 2021-01-11 – 2021-01-20 (×10): 10 meq via ORAL
  Filled 2021-01-11 (×10): qty 1

## 2021-01-11 MED ORDER — METOPROLOL SUCCINATE ER 50 MG PO TB24
50.0000 mg | ORAL_TABLET | Freq: Every day | ORAL | Status: DC
  Administered 2021-01-11 – 2021-01-20 (×10): 50 mg via ORAL
  Filled 2021-01-11 (×10): qty 1

## 2021-01-11 MED ORDER — SODIUM CHLORIDE 0.9 % IV BOLUS
1000.0000 mL | Freq: Once | INTRAVENOUS | Status: AC
  Administered 2021-01-11: 1000 mL via INTRAVENOUS

## 2021-01-11 NOTE — Anesthesia Postprocedure Evaluation (Signed)
Anesthesia Post Note  Patient: Donald Harrington  Procedure(s) Performed: TRANSESOPHAGEAL ECHOCARDIOGRAM (TEE) (N/A )     Patient location during evaluation: Endoscopy Anesthesia Type: MAC Level of consciousness: awake Pain management: pain level controlled Vital Signs Assessment: post-procedure vital signs reviewed and stable Respiratory status: spontaneous breathing Cardiovascular status: stable Postop Assessment: no apparent nausea or vomiting Anesthetic complications: no   No complications documented.  Last Vitals:  Vitals:   01/11/21 0312 01/11/21 0700  BP: 113/68 (!) 126/91  Pulse: 71 82  Resp: 17 20  Temp: (!) 36.4 C 36.9 C  SpO2: 94% 97%    Last Pain:  Vitals:   01/11/21 0847  TempSrc:   PainSc: Burke Jr

## 2021-01-11 NOTE — Progress Notes (Signed)
[facility] PROGRESS NOTE    Progress Note  Atzin Buchta  BZJ:[id number] DOB: [birth year] DOA: 01/03/2021 PCP: Patient, No Pcp Per     Brief Narrative:   Spiros Greenfeld is an 65 y.o. 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  Reports no abdominal pain nausea or vomiting black stools or red stools.  He is found to be tachycardic blood cultures were sent which were positive for Streptococcus ID was consulted  Significant events: EGD on 01/06/2019 due to shortness of gastritis, duodenal ulcer but no evidence of bleeding. Status post 2 units packed red blood cells 01/08/2019 orthopantogram multiple dental caries with residual dentition lucency within the maxilla related to absent tooth 10 which may represent an residual periapical abscess. 01/06/2021 2D echocardiogram showed poor acoustic window EF of 55 MV bioprosthetic valve not well visualized with a mean gradient of 9 mmHg.  AV bioprosthetic valve not well visualized with a mean gradient of 30 mmHg  Patient is medically stable awaiting inpatient rehab placement. Antibiotics: 3.21.2022 rocephin 3.21.2022 Gentamacin  3.22.2022 PCN  Microbiology data: 01/04/2021 blood culture: Streptococcus Gondi 01/07/2021 blood cultures negative till date.  Procedures: 01/08/2021 TEE no vegetation  Assessment/Plan:   Acute blood loss anemia/upper GI bleed: He reports taking Goody powder for neck pain. Status post 2 units of packed red blood cells hemoglobin has improved.  No signs of further bleeding. Hemoglobin has remained stable.  Acute multifocal embolic strokes: Likely in the setting of subtherapeutic INR, history of A. fib versus  septic emboli/given bacteremia. Continue heparin for now. Etiology of his embolic stroke remains uncertain, question septic versus subtherapeutic INR in the setting of paroxysmal atrial fibrillation.  Sepsis due to Streptococcus bacteremia/concern for endocarditis/in the setting of periodontal disease: Sepsis physiology on 01/05/2021, now resolved. He will need 2 weeks of IV gentamicin and penicillin followed by 4 weeks of penicillin alone. Dental recommended no surgical intervention at this point, they recommended follow-up as an outpatient.  Paroxysmal atrial fibrillation: In sinus rhythm we will leave on IV heparin until planned by dentist have been developed.  Essential hypertension: Holding antihypertensive medication as he was hypotensive and septic on admission.    DVT prophylaxis: heparin Family Communication:none Status is: Inpatient  Remains inpatient appropriate because:Hemodynamically unstable   Dispo: The patient is from: Home              Anticipated d/c is to: SNF              Patient currently is not medically stable to d/c.   Difficult to place patient No    Code Status:     Code Status Orders  (From admission, onward)         Start     Ordered   01/03/21 2051  Full code  Continuous        01/03/21 2052        Code Status History    This patient has a current code status but no historical code status.   Advance Care Planning Activity        IV Access:    Peripheral IV   Procedures and diagnostic studies:   MR CERVICAL SPINE WO CONTRAST  Result Date:  01/10/2021 CLINICAL DATA:  Initial evaluation for acute neck pain, infection suspected. EXAM: MRI CERVICAL SPINE WITHOUT CONTRAST TECHNIQUE: Multiplanar, multisequence MR imaging of the cervical spine was performed. No intravenous contrast was administered. COMPARISON:  Prior MRI from 12/05/2020. FINDINGS: Alignment: Examination somewhat degraded by motion artifact. Straightening with slight  reversal of the normal cervical lordosis. Trace anterolisthesis of C3 on C4, stable. Vertebrae: Vertebral body height maintained without acute or interval fracture. Bone marrow signal intensity diffusely heterogeneous without worrisome osseous lesion. Discogenic reactive endplate changes present about the C5-6 through C7-T1 interspaces, with partial ankylosis across the C6-7 disc space. Appearance is stable from previous. No signal changes to suggest osteomyelitis discitis or septic arthritis. Cord: Normal signal and morphology.  No epidural collections. Posterior Fossa, vertebral arteries, paraspinal tissues: Visualized brain and posterior fossa within normal limits. Craniocervical junction normal. Paraspinous and prevertebral soft tissues within normal limits. Normal flow voids seen within the vertebral arteries bilaterally. Disc levels: C2-C3: Small central disc protrusion mildly indents the ventral thecal sac, slightly asymmetric to the left. No significant spinal stenosis or cord deformity. Superimposed mild left greater than right facet degeneration without significant foraminal stenosis. Appearance is stable. C3-C4: Trace anterolisthesis. Central disc protrusion indents the ventral thecal sac, contacting and mildly flattening the ventral spinal cord. No cord signal changes or significant spinal stenosis. Superimposed left greater than right uncovertebral and facet hypertrophy with resultant mild left C4 foraminal stenosis. Right neural foramina remains patent. Appearance is stable. C4-C5: Degenerative intervertebral disc space narrowing with diffuse disc osteophyte complex, eccentric to the left. Broad posterior component flattens and partially effaces the ventral thecal sac with resultant mild spinal stenosis. Mild cord flattening without cord signal changes. Superimposed bilateral facet hypertrophy with resultant moderate to severe bilateral C5 foraminal narrowing. Appearance is stable. C5-C6: Degenerative  intervertebral disc space narrowing with diffuse disc osteophyte complex, asymmetric to the left. Broad posterior component flattens and partially faces the ventral thecal sac. Mild flattening of the ventral cord, greater on the left. No cord signal changes. Mild spinal stenosis is stable. Moderate bilateral C6 foraminal narrowing, worse on the right, also stable. C6-C7: Advanced degenerative intervertebral disc space narrowing with partial ankylosis across the C6-7 interspace. Right greater than left uncovertebral spurring. Posterior disc osteophyte flattens and partially effaces the ventral thecal sac without significant stenosis or cord impingement. Superimposed mild facet hypertrophy. Mild bilateral C7 foraminal stenosis is relatively stable. C7-T1: Degenerative intervertebral disc space narrowing with diffuse disc osteophyte complex. Flattening and partial effacement of the ventral thecal sac without significant spinal stenosis or cord deformity. Superimposed bilateral facet hypertrophy. Moderate left with mild right C8 foraminal narrowing, stable. Visualized upper thoracic spine demonstrates no significant finding. IMPRESSION: 1. No MRI evidence for acute infection or other abnormality within the cervical spine. 2. Multilevel cervical spondylosis with resultant mild spinal stenosis at C4-5 and C5-6. 3. Multifactorial degenerative changes with resultant multilevel foraminal narrowing as above. Notable findings include moderate to severe bilateral C5 foraminal narrowing, with moderate bilateral C6 and left C8 foraminal stenosis. Electronically Signed   By: Jeannine Boga M.D.   On: 01/10/2021 23:15   [place] EKG SITE RITE  Result Date: 01/10/2021 If Site Rite image not attached, placement could not be confirmed due to current cardiac rhythm.    Medical Consultants:    None.   Subjective:    Son Barkan relates he continues to feel better today.  Objective:    Vitals:   01/10/21 2328  01/11/21 0312 01/11/21 0500 01/11/21 0700  BP: 115/70 113/68  (!) 126/91  Pulse: 75 71  82  Resp: 10 17  20   Temp: 98.3 F (36.8 C) (!) 97.5 F (36.4 C)  98.4 F (36.9 C)  TempSrc: Oral Oral  Oral  SpO2: 98% 94%  97%  Weight:   82.1 kg   Height:       SpO2: 97 % O2 Flow Rate (L/min): 3 L/min FiO2 (%): 28 %   Intake/Output Summary (Last 24 hours) at 01/11/2021 0828 Last data filed at 01/11/2021 0244 Gross per 24 hour  Intake --  Output 1150 ml  Net -1150 ml   Filed Weights   01/08/21 0311 01/10/21 0500 01/11/21 0500  Weight: 79.8 kg 82.8 kg 82.1 kg    Exam: General exam: In no acute distress. Respiratory system: Good air movement and clear to auscultation. Cardiovascular system: S1 & S2 heard, RRR. No JVD. Gastrointestinal system: Abdomen is nondistended, soft and nontender.  Extremities: No pedal edema. Skin: No rashes, lesions or ulcers Psychiatry: Judgement and insight appear normal. Mood & affect appropriate.  Data Reviewed:    Labs: Basic Metabolic Panel: Recent Labs  Lab 01/05/21 0320 01/06/21 0410 01/07/21 0323 01/08/21 0640 01/10/21 0325  NA 137 135 139 138 137  K 3.6 3.6 4.0 4.2 4.8  CL 105 105 109 107 107  CO2 21* 23 23 27 27   GLUCOSE 90 96 103* 92 96  BUN 15 15 12 10 9   CREATININE 1.11 1.06 1.01 0.93 1.11  CALCIUM 7.8* 7.7* 7.7* 8.0* 8.3*  MG 2.0  --   --   --   --   PHOS 3.0  --   --   --   --    GFR Estimated Creatinine Clearance: 68.7 mL/min (by C-G formula based on SCr of 1.11 mg/dL). Liver Function Tests: Recent Labs  Lab 01/05/21 0320 01/07/21 0323 01/08/21 0640  AST 14* 12* 12*  ALT 14 11 9   ALKPHOS 38 39 38  BILITOT 0.9 0.4 0.6  PROT 4.9* 4.8* 4.9*  ALBUMIN 2.3* 2.0* 2.1*   No results for input(s): LIPASE, AMYLASE in the last 168 hours. No results for input(s): AMMONIA in the last 168 hours. Coagulation profile Recent Labs  Lab 01/05/21 0320  INR 1.5*   COVID-19 Labs  No results for input(s): DDIMER, FERRITIN, LDH,  CRP in the last 72 hours.  Lab Results  Component Value Date   [place] NEGATIVE 01/05/2021    CBC: Recent Labs  Lab 01/06/21 0410 01/07/21 0323 01/08/21 0640 01/10/21 0325 01/11/21 0321  WBC 3.3* 3.7* 4.0 4.9 4.6  HGB 8.2* 8.8* 9.1* 9.0* 8.9*  HCT 25.7* 26.7* 27.4* 28.2* 28.1*  MCV 102.0* 101.1* 101.1* 104.4* 102.9*  PLT 120* 125* 145* 156 164   Cardiac Enzymes: No results for input(s): CKTOTAL, CKMB, CKMBINDEX, TROPONINI in the last 168 hours. BNP (last 3 results) No results for input(s): PROBNP in the last 8760 hours. CBG: No results for input(s): GLUCAP in the last 168 hours. D-Dimer: No results for input(s): DDIMER in the last 72 hours. Hgb A1c: No results for input(s): HGBA1C in the last 72 hours. Lipid Profile: No results for input(s): CHOL, HDL, LDLCALC, TRIG, CHOLHDL, LDLDIRECT in the last 72 hours. Thyroid function studies: No results for input(s): TSH, T4TOTAL, T3FREE, THYROIDAB in the last 72 hours.  Invalid input(s): FREET3 Anemia work up: No results for input(s): VITAMINB12, FOLATE, FERRITIN, TIBC, IRON, RETICCTPCT in the last 72 hours. Sepsis Labs: Recent Labs  Lab 01/07/21 [phone] 01/08/21 [phone]  01/10/21 0325 01/11/21 0321  WBC 3.7* 4.0 4.9 4.6   Microbiology Recent Results (from the past 240 hour(s))  Culture, blood (Routine X 2) w Reflex to ID Panel     Status: Abnormal   Collection Time: 01/04/21  3:04 PM   Specimen: BLOOD LEFT HAND  Result Value Ref Range Status   Specimen Description BLOOD LEFT HAND  Final   Special Requests   Final    BOTTLES DRAWN AEROBIC AND ANAEROBIC Blood Culture results may not be optimal due to an inadequate volume of blood received in culture bottles   Culture  Setup Time   Final    GRAM POSITIVE COCCI IN CHAINS IN BOTH AEROBIC AND ANAEROBIC BOTTLES CRITICAL RESULT CALLED TO, READ BACK BY AND VERIFIED WITH: J,MILLEN PHARMD @0851  01/05/21 EB Performed at [facility], [place] [address] [place], [place]  [address]    Culture STREPTOCOCCUS GORDONII (A)  Final   Report Status 01/07/2021 FINAL  Final   Organism ID, Bacteria STREPTOCOCCUS GORDONII  Final      Susceptibility   Streptococcus gordonii - MIC*    PENICILLIN <=0.06 SENSITIVE Sensitive     CEFTRIAXONE <=0.12 SENSITIVE Sensitive     ERYTHROMYCIN 2 RESISTANT Resistant     LEVOFLOXACIN 1 SENSITIVE Sensitive     VANCOMYCIN 0.5 SENSITIVE Sensitive     * STREPTOCOCCUS GORDONII  Blood Culture ID Panel (Reflexed)     Status: Abnormal   Collection Time: 01/04/21  3:04 PM  Result Value Ref Range Status   Enterococcus faecalis NOT DETECTED NOT DETECTED Final   Enterococcus Faecium NOT DETECTED NOT DETECTED Final   Listeria monocytogenes NOT DETECTED NOT DETECTED Final   Staphylococcus species NOT DETECTED NOT DETECTED Final   Staphylococcus aureus (BCID) NOT DETECTED NOT DETECTED Final   Staphylococcus epidermidis NOT DETECTED NOT DETECTED Final   Staphylococcus lugdunensis NOT DETECTED NOT DETECTED Final   Streptococcus species DETECTED (A) NOT DETECTED Final    Comment: Not Enterococcus species, Streptococcus agalactiae, Streptococcus pyogenes, or Streptococcus pneumoniae. CRITICAL RESULT CALLED TO, READ BACK BY AND VERIFIED WITH: J,MILLEN PHARMD @0851  01/05/21 EB    Streptococcus agalactiae NOT DETECTED NOT DETECTED Final   Streptococcus pneumoniae NOT DETECTED NOT DETECTED Final   Streptococcus pyogenes NOT DETECTED NOT DETECTED Final   A.calcoaceticus-baumannii NOT DETECTED NOT DETECTED Final   Bacteroides fragilis NOT DETECTED NOT DETECTED Final   Enterobacterales NOT DETECTED NOT DETECTED Final   Enterobacter cloacae complex NOT DETECTED NOT DETECTED Final   Escherichia coli NOT DETECTED NOT DETECTED Final   Klebsiella aerogenes NOT DETECTED NOT DETECTED Final   Klebsiella oxytoca NOT DETECTED NOT DETECTED Final   Klebsiella pneumoniae NOT DETECTED NOT DETECTED Final   Proteus species NOT DETECTED NOT DETECTED Final   Salmonella  species NOT DETECTED NOT DETECTED Final   Serratia marcescens NOT DETECTED NOT DETECTED Final   Haemophilus influenzae NOT DETECTED NOT DETECTED Final   Neisseria meningitidis NOT DETECTED NOT DETECTED Final   Pseudomonas aeruginosa NOT DETECTED NOT DETECTED Final   Stenotrophomonas maltophilia NOT DETECTED NOT DETECTED Final   Candida albicans NOT DETECTED NOT DETECTED Final   Candida auris NOT DETECTED NOT DETECTED Final   Candida glabrata NOT DETECTED NOT DETECTED Final   Candida krusei NOT DETECTED NOT DETECTED Final   Candida parapsilosis NOT DETECTED NOT DETECTED Final   Candida tropicalis NOT DETECTED NOT DETECTED Final   Cryptococcus neoformans/gattii NOT DETECTED NOT DETECTED Final    Comment: Performed at [facility], [address] [place], [place] [address]  Culture, blood (Routine X 2) w Reflex to ID Panel     Status: Abnormal   Collection Time: 01/04/21  3:11 PM   Specimen: BLOOD LEFT HAND  Result Value Ref Range Status   Specimen Description BLOOD LEFT HAND  Final   Special Requests   Final    BOTTLES DRAWN AEROBIC ONLY Blood Culture adequate volume   Culture  Setup Time   Final    GRAM POSITIVE COCCI IN CHAINS AEROBIC BOTTLE ONLY CRITICAL VALUE NOTED.  VALUE IS CONSISTENT WITH PREVIOUSLY REPORTED AND CALLED VALUE.    Culture (A)  Final    STREPTOCOCCUS GORDONII SUSCEPTIBILITIES PERFORMED ON PREVIOUS CULTURE WITHIN THE LAST 5 DAYS. Performed at [facility], [place] [address], [place], [place] [address]    Report Status 01/07/2021 FINAL  Final  Resp Panel by RT-PCR (Flu A&B, Covid) Nasopharyngeal Swab     Status: None   Collection Time: 01/05/21  6:31 AM   Specimen: Nasopharyngeal Swab; Nasopharyngeal(NP) swabs in vial transport medium  Result Value Ref Range Status   SARS Coronavirus 2 by RT PCR NEGATIVE NEGATIVE Final    Comment: (NOTE) SARS-CoV-2 target nucleic acids are NOT DETECTED.  The SARS-CoV-2 RNA is generally detectable in upper  respiratory specimens during the acute phase of infection. The lowest concentration of SARS-CoV-2 viral copies this assay can detect is 138 copies/mL. A negative result does not preclude SARS-Cov-2 infection and should not be used as the sole basis for treatment or other patient management decisions. A negative result may occur with  improper specimen collection/handling, submission of specimen other than nasopharyngeal swab, presence of viral mutation(s) within the areas targeted by this assay, and inadequate number of viral copies(<138 copies/mL). A negative result must be combined with clinical observations, patient history, and epidemiological information. The expected result is Negative.  Fact Sheet for Patients:  EntrepreneurPulse.com.au  Fact Sheet for Healthcare Providers:  IncredibleEmployment.be  This test is no t yet approved or cleared by the [place] FDA and  has been authorized for detection and/or diagnosis of SARS-CoV-2 by FDA under an Emergency Use Authorization (EUA). This EUA will remain  in effect (meaning this test can be used) for the duration of the COVID-19 declaration under Section 564(b)(1) of the Act, 21 U.S.C.section 360bbb-3(b)(1), unless the authorization is terminated  or revoked sooner.       Influenza A by PCR NEGATIVE NEGATIVE Final   Influenza B by PCR NEGATIVE NEGATIVE Final    Comment: (NOTE) The Xpert Xpress SARS-CoV-2/FLU/RSV plus assay is intended as an aid in the diagnosis of influenza from Nasopharyngeal swab specimens and should not be used as a sole basis for treatment. Nasal washings and aspirates are unacceptable for Xpert Xpress SARS-CoV-2/FLU/RSV testing.  Fact Sheet for Patients: EntrepreneurPulse.com.au  Fact Sheet for Healthcare Providers: IncredibleEmployment.be  This test is not yet approved or cleared by the [place] FDA and has been  authorized for detection and/or diagnosis of SARS-CoV-2 by FDA under an Emergency Use Authorization (EUA). This EUA will remain in effect (meaning this test can be used) for the duration of the COVID-19 declaration under Section 564(b)(1) of the Act, 21 U.S.C. section 360bbb-3(b)(1), unless the authorization is terminated or revoked.  Performed at [facility], [place] [address], [place], [place] [address]   Culture, blood (Routine X 2) w Reflex to ID Panel     Status: None (Preliminary result)   Collection Time: 01/07/21 11:24 AM  Specimen: BLOOD RIGHT FOREARM  Result Value Ref Range Status   Specimen Description BLOOD RIGHT FOREARM  Final   Special Requests   Final    BOTTLES DRAWN AEROBIC AND ANAEROBIC Blood Culture results may not be optimal due to an inadequate volume of blood received in culture bottles   Culture   Final    NO GROWTH 3 DAYS Performed at [facility], [place] [address], [place], [place] [address]    Report Status PENDING  Incomplete  Culture, blood (Routine X 2) w Reflex to ID Panel     Status: None (Preliminary result)   Collection Time: 01/07/21 11:29 AM   Specimen: BLOOD RIGHT HAND  Result Value Ref Range Status   Specimen Description BLOOD RIGHT HAND  Final   Special Requests   Final    BOTTLES DRAWN AEROBIC AND ANAEROBIC Blood Culture results may not be optimal due to an inadequate volume of blood received in culture bottles   Culture   Final    NO GROWTH 3 DAYS Performed at [facility], [place] [address] [place],  [address]    Report Status PENDING  Incomplete     Medications:   . sodium chloride   Intravenous Once  . apixaban  5 mg Oral BID  . Chlorhexidine Gluconate Cloth  6 each Topical Daily  . melatonin  5 mg Oral QHS  . metoprolol tartrate  25 mg Oral BID  . pantoprazole  40 mg Intravenous Q12H   Continuous Infusions: . sodium chloride 10 mL/hr at 01/08/21 1039  . sodium chloride 20 mL/hr at 01/10/21 0200  .  gentamicin 200 mg (01/10/21 2214)  . penicillin g continuous IV infusion 12 Million Units (01/11/21 0507)      LOS: 8 days   [name]  [facility]  01/11/2021, 8:28 AM

## 2021-01-11 NOTE — Plan of Care (Signed)
  Problem: Education: Goal: Knowledge of secondary prevention will improve Outcome: Progressing Goal: Knowledge of patient specific risk factors addressed and post discharge goals established will improve Outcome: Progressing   Problem: Education: Goal: Knowledge of General Education information will improve Description: Including pain rating scale, medication(s)/side effects and non-pharmacologic comfort measures Outcome: Progressing   Problem: Health Behavior/Discharge Planning: Goal: Ability to manage health-related needs will improve Outcome: Progressing   Problem: Clinical Measurements: Goal: Ability to maintain clinical measurements within normal limits will improve Outcome: Progressing Goal: Will remain free from infection Outcome: Progressing Goal: Diagnostic test results will improve Outcome: Progressing Goal: Respiratory complications will improve Outcome: Progressing Goal: Cardiovascular complication will be avoided Outcome: Progressing   Problem: Activity: Goal: Risk for activity intolerance will decrease Outcome: Progressing   Problem: Nutrition: Goal: Adequate nutrition will be maintained Outcome: Progressing   Problem: Coping: Goal: Level of anxiety will decrease Outcome: Progressing   Problem: Elimination: Goal: Will not experience complications related to bowel motility Outcome: Progressing Goal: Will not experience complications related to urinary retention Outcome: Progressing

## 2021-01-11 NOTE — Progress Notes (Signed)
Pt's BP soft and MD made aware, new orders given   01/11/21 2151  Assess: MEWS Score  Temp 98.6 F (37 C)  BP (!) 80/55  Assess: MEWS Score  MEWS Temp 0  MEWS Systolic 2  MEWS Pulse 0  MEWS RR 0  MEWS LOC 0  MEWS Score 2  MEWS Score Color Yellow  Assess: if the MEWS score is Yellow or Red  Were vital signs taken at a resting state? Yes  Focused Assessment Change from prior assessment (see assessment flowsheet)  Early Detection of Sepsis Score *See Row Information* Low  MEWS guidelines implemented *See Row Information* Yes  Treat  MEWS Interventions Escalated (See documentation below)  Escalate  MEWS: Escalate Yellow: discuss with charge nurse/RN and consider discussing with provider and RRT  Notify: Charge Nurse/RN  Name of Charge Nurse/RN Notified Phil RN  Date Charge Nurse/RN Notified 01/11/21  Time Charge Nurse/RN Notified 2200  Notify: Provider  Provider Name/Title Opyd MD  Date Provider Notified 01/11/21  Time Provider Notified 2200  Notification Type Page  Notification Reason Change in status  Provider response See new orders  Date of Provider Response 01/11/21  Time of Provider Response 2205

## 2021-01-12 DIAGNOSIS — D62 Acute posthemorrhagic anemia: Secondary | ICD-10-CM | POA: Diagnosis not present

## 2021-01-12 DIAGNOSIS — R7881 Bacteremia: Secondary | ICD-10-CM | POA: Diagnosis not present

## 2021-01-12 DIAGNOSIS — B955 Unspecified streptococcus as the cause of diseases classified elsewhere: Secondary | ICD-10-CM | POA: Diagnosis not present

## 2021-01-12 LAB — CBC
HCT: 28.3 % — ABNORMAL LOW (ref 39.0–52.0)
Hemoglobin: 9.4 g/dL — ABNORMAL LOW (ref 13.0–17.0)
MCH: 33.7 pg (ref 26.0–34.0)
MCHC: 33.2 g/dL (ref 30.0–36.0)
MCV: 101.4 fL — ABNORMAL HIGH (ref 80.0–100.0)
Platelets: 172 10*3/uL (ref 150–400)
RBC: 2.79 MIL/uL — ABNORMAL LOW (ref 4.22–5.81)
RDW: 16.2 % — ABNORMAL HIGH (ref 11.5–15.5)
WBC: 4.5 10*3/uL (ref 4.0–10.5)
nRBC: 0 % (ref 0.0–0.2)

## 2021-01-12 LAB — BASIC METABOLIC PANEL
Anion gap: 6 (ref 5–15)
BUN: 9 mg/dL (ref 8–23)
CO2: 28 mmol/L (ref 22–32)
Calcium: 8.6 mg/dL — ABNORMAL LOW (ref 8.9–10.3)
Chloride: 102 mmol/L (ref 98–111)
Creatinine, Ser: 1.14 mg/dL (ref 0.61–1.24)
GFR, Estimated: >60 mL/min (ref >60)
Glucose, Bld: 88 mg/dL (ref 70–99)
Potassium: 4.2 mmol/L (ref 3.5–5.1)
Sodium: 136 mmol/L (ref 135–145)

## 2021-01-12 LAB — CULTURE, BLOOD (ROUTINE X 2)
Culture: NO GROWTH
Culture: NO GROWTH

## 2021-01-12 NOTE — Progress Notes (Signed)
[facility] PROGRESS NOTE    Progress Note  Ashish Rossetti  XBW:[id number] DOB: [birth year] DOA: 01/03/2021 PCP: Patient, No Pcp Per     Brief Narrative:   Donald Harrington is an 65 y.o. 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  Reports no abdominal pain nausea or vomiting black stools or red stools.  He is found to be tachycardic blood cultures were sent which were positive for Streptococcus ID was consulted  Significant events: EGD on 01/06/2019 due to shortness of gastritis, duodenal ulcer but no evidence of bleeding. Status post 2 units packed red blood cells 01/08/2019 orthopantogram multiple dental caries with residual dentition lucency within the maxilla related to absent tooth 10 which may represent an residual periapical abscess. 01/06/2021 2D echocardiogram showed poor acoustic window EF of 55 MV bioprosthetic valve not well visualized with a mean gradient of 9 mmHg.  AV bioprosthetic valve not well visualized with a mean gradient of 30 mmHg  Patient is medically stable awaiting inpatient rehab placement. Antibiotics: 3.21.2022 rocephin 3.21.2022 Gentamacin  3.22.2022 PCN  Microbiology data: 01/04/2021 blood culture: Streptococcus Gondi 01/07/2021 blood cultures negative till date.  Procedures: 01/08/2021 TEE no vegetation  Assessment/Plan:   Acute blood loss anemia/upper GI bleed: He reports taking Goody powder for neck pain. Status post 2 units of packed red blood cells hemoglobin has improved.  No signs of further bleeding. Hemoglobin has remained stable.  Acute multifocal embolic strokes: Likely in the setting of subtherapeutic INR, history of A. fib versus  septic emboli/given bacteremia. Continue heparin for now. Etiology of his embolic stroke remains uncertain, question septic versus subtherapeutic INR in the setting of paroxysmal atrial fibrillation.  Sepsis due to Streptococcus bacteremia/concern for endocarditis/in the setting of periodontal disease: Sepsis physiology on 01/05/2021, now resolved. He will need 2 weeks of IV gentamicin and penicillin followed by 4 weeks of penicillin alone. Dental recommended no surgical intervention at this point, they recommended follow-up as an outpatient.  Paroxysmal atrial fibrillation: In sinus rhythm rate controlled, cont Eliquis  Essential hypertension: COnt antihypertensive medications Bp is well controlled.   DVT prophylaxis: heparin Family Communication:none Status is: Inpatient  Remains inpatient appropriate because:Hemodynamically unstable   Dispo: The patient is from: Home              Anticipated d/c is to: SNF              Patient currently is not medically stable to d/c.   Difficult to place patient No    Code Status:     Code Status Orders  (From admission, onward)         Start     Ordered   01/03/21 2051  Full code  Continuous        01/03/21 2052        Code Status History    This patient has a current code status but no historical code status.   Advance Care Planning Activity        IV Access:    Peripheral IV   Procedures and diagnostic studies:   MR CERVICAL SPINE WO CONTRAST  Result Date: 01/10/2021 CLINICAL DATA:  Initial evaluation for acute neck pain, infection suspected. EXAM: MRI  CERVICAL SPINE WITHOUT CONTRAST TECHNIQUE: Multiplanar, multisequence MR imaging of the cervical spine was performed. No intravenous contrast was administered. COMPARISON:  Prior MRI from 12/05/2020. FINDINGS: Alignment: Examination somewhat degraded by motion artifact. Straightening with slight reversal of the normal cervical lordosis. Trace anterolisthesis of C3 on  C4, stable. Vertebrae: Vertebral body height maintained without acute or interval fracture. Bone marrow signal intensity diffusely heterogeneous without worrisome osseous lesion. Discogenic reactive endplate changes present about the C5-6 through C7-T1 interspaces, with partial ankylosis across the C6-7 disc space. Appearance is stable from previous. No signal changes to suggest osteomyelitis discitis or septic arthritis. Cord: Normal signal and morphology.  No epidural collections. Posterior Fossa, vertebral arteries, paraspinal tissues: Visualized brain and posterior fossa within normal limits. Craniocervical junction normal. Paraspinous and prevertebral soft tissues within normal limits. Normal flow voids seen within the vertebral arteries bilaterally. Disc levels: C2-C3: Small central disc protrusion mildly indents the ventral thecal sac, slightly asymmetric to the left. No significant spinal stenosis or cord deformity. Superimposed mild left greater than right facet degeneration without significant foraminal stenosis. Appearance is stable. C3-C4: Trace anterolisthesis. Central disc protrusion indents the ventral thecal sac, contacting and mildly flattening the ventral spinal cord. No cord signal changes or significant spinal stenosis. Superimposed left greater than right uncovertebral and facet hypertrophy with resultant mild left C4 foraminal stenosis. Right neural foramina remains patent. Appearance is stable. C4-C5: Degenerative intervertebral disc space narrowing with diffuse disc osteophyte complex, eccentric to the left. Broad posterior component flattens and partially effaces the ventral thecal sac with resultant mild spinal stenosis. Mild cord flattening without cord signal changes. Superimposed bilateral facet hypertrophy with resultant moderate to severe bilateral C5 foraminal narrowing. Appearance is stable. C5-C6: Degenerative intervertebral disc space narrowing with diffuse disc osteophyte complex,  asymmetric to the left. Broad posterior component flattens and partially faces the ventral thecal sac. Mild flattening of the ventral cord, greater on the left. No cord signal changes. Mild spinal stenosis is stable. Moderate bilateral C6 foraminal narrowing, worse on the right, also stable. C6-C7: Advanced degenerative intervertebral disc space narrowing with partial ankylosis across the C6-7 interspace. Right greater than left uncovertebral spurring. Posterior disc osteophyte flattens and partially effaces the ventral thecal sac without significant stenosis or cord impingement. Superimposed mild facet hypertrophy. Mild bilateral C7 foraminal stenosis is relatively stable. C7-T1: Degenerative intervertebral disc space narrowing with diffuse disc osteophyte complex. Flattening and partial effacement of the ventral thecal sac without significant spinal stenosis or cord deformity. Superimposed bilateral facet hypertrophy. Moderate left with mild right C8 foraminal narrowing, stable. Visualized upper thoracic spine demonstrates no significant finding. IMPRESSION: 1. No MRI evidence for acute infection or other abnormality within the cervical spine. 2. Multilevel cervical spondylosis with resultant mild spinal stenosis at C4-5 and C5-6. 3. Multifactorial degenerative changes with resultant multilevel foraminal narrowing as above. Notable findings include moderate to severe bilateral C5 foraminal narrowing, with moderate bilateral C6 and left C8 foraminal stenosis. Electronically Signed   By: Jeannine Boga M.D.   On: 01/10/2021 23:15   [place] EKG SITE RITE  Result Date: 01/10/2021 If Site Rite image not attached, placement could not be confirmed due to current cardiac rhythm.    Medical Consultants:    None.   Subjective:    Nicholi Ghuman no complains  Objective:    Vitals:   01/12/21 0200 01/12/21 0337 01/12/21 0431 01/12/21 0516  BP: 126/73 98/64  108/63  Pulse:  74    Resp:  18    Temp:  98.5 F (36.9 C)    TempSrc:  Oral    SpO2:  93%    Weight:   79.5 kg   Height:       SpO2: 93 % O2 Flow Rate (L/min): 3 L/min FiO2 (%): 28 %   Intake/Output Summary (Last 24 hours) at 01/12/2021 0733 Last data filed at 01/12/2021 0438 Gross per 24 hour  Intake 3311.67 ml  Output 1075 ml  Net 2236.67 ml   Filed Weights   01/10/21 0500 01/11/21 0500 01/12/21 0431  Weight: 82.8 kg 82.1 kg 79.5 kg    Exam: General exam: In no acute distress. Respiratory system: Good air movement and clear to auscultation. Cardiovascular system: S1 & S2 heard, RRR. No JVD. Gastrointestinal system: Abdomen is nondistended, soft and nontender.  Extremities: No pedal edema. Skin: No rashes, lesions or ulcers Psychiatry: Judgement and insight appear normal. Mood & affect appropriate.  Data Reviewed:    Labs: Basic Metabolic Panel: Recent Labs  Lab 01/06/21 0410 01/07/21 0323 01/08/21 0640 01/10/21 0325 01/12/21 0213  NA 135 139 138 137 136  K 3.6 4.0 4.2 4.8 4.2  CL 105 109 107 107 102  CO2 23 23 27 27 28   GLUCOSE 96 103* 92 96 88  BUN 15 12 10 9 9   CREATININE 1.06 1.01 0.93 1.11 1.14  CALCIUM 7.7* 7.7* 8.0* 8.3* 8.6*   GFR Estimated Creatinine Clearance: 66.9 mL/min (by C-G formula based on SCr of 1.14 mg/dL). Liver Function Tests: Recent Labs  Lab 01/07/21 0323 01/08/21 0640  AST 12* 12*  ALT 11 9  ALKPHOS 39 38  BILITOT 0.4 0.6  PROT 4.8* 4.9*  ALBUMIN 2.0* 2.1*   No results for input(s): LIPASE, AMYLASE in the last 168 hours. No results for input(s): AMMONIA in the last 168 hours. Coagulation profile No results for input(s): INR, PROTIME in the last 168 hours. COVID-19 Labs  No results for input(s): DDIMER, FERRITIN, LDH, CRP in the last 72 hours.  Lab Results  Component Value Date   [place] NEGATIVE 01/05/2021    CBC: Recent Labs  Lab 01/07/21 0323 01/08/21 0640 01/10/21 0325 01/11/21 0321 01/12/21 0213  WBC 3.7* 4.0 4.9 4.6 4.5  HGB 8.8* 9.1*  9.0* 8.9* 9.4*  HCT 26.7* 27.4* 28.2* 28.1* 28.3*  MCV 101.1* 101.1* 104.4* 102.9* 101.4*  PLT 125* 145* 156 164 172   Cardiac Enzymes: No results for input(s): CKTOTAL, CKMB, CKMBINDEX, TROPONINI in the last 168 hours. BNP (last 3 results) No results for input(s): PROBNP in the last 8760 hours. CBG: No results for input(s): GLUCAP in the last 168 hours. D-Dimer: No results for input(s): DDIMER in the last 72 hours. Hgb A1c: No results for input(s): HGBA1C in the last 72 hours. Lipid Profile: No results for input(s): CHOL, HDL, LDLCALC, TRIG, CHOLHDL, LDLDIRECT in the last 72 hours. Thyroid function studies: No results for input(s): TSH, T4TOTAL, T3FREE, THYROIDAB in the last 72 hours.  Invalid input(s): FREET3 Anemia work up: No results for input(s): VITAMINB12, FOLATE, FERRITIN, TIBC, IRON, RETICCTPCT in the last 72 hours. Sepsis Labs: Recent Labs  Lab 01/08/21 0640 01/10/21 0325 01/11/21 0321 01/12/21 0213  WBC 4.0 4.9 4.6 4.5   Microbiology Recent Results (from the past 240 hour(s))  Culture, blood (Routine X 2) w Reflex to ID Panel     Status: Abnormal   Collection Time: 01/04/21  3:04 PM   Specimen: BLOOD LEFT HAND  Result Value Ref Range Status   Specimen Description BLOOD LEFT HAND  Final   Special Requests   Final    BOTTLES DRAWN AEROBIC AND ANAEROBIC Blood Culture results may not be optimal due to an inadequate volume of blood received in culture bottles   Culture  Setup Time   Final    GRAM POSITIVE COCCI IN CHAINS IN BOTH AEROBIC AND ANAEROBIC BOTTLES CRITICAL RESULT CALLED TO, READ BACK BY AND VERIFIED WITH: J,MILLEN PHARMD @0851  01/05/21 EB Performed at [facility], [address], [place], [place] [address]    Culture STREPTOCOCCUS GORDONII (A)  Final   Report Status 01/07/2021 FINAL  Final   Organism ID, Bacteria STREPTOCOCCUS GORDONII  Final      Susceptibility   Streptococcus gordonii - MIC*    PENICILLIN <=0.06 SENSITIVE Sensitive      CEFTRIAXONE <=0.12 SENSITIVE Sensitive     ERYTHROMYCIN 2 RESISTANT Resistant     LEVOFLOXACIN 1 SENSITIVE Sensitive     VANCOMYCIN 0.5 SENSITIVE Sensitive     * STREPTOCOCCUS GORDONII  Blood Culture ID Panel (Reflexed)     Status: Abnormal   Collection Time: 01/04/21  3:04 PM  Result Value Ref Range Status   Enterococcus faecalis NOT DETECTED NOT DETECTED Final   Enterococcus Faecium NOT DETECTED NOT DETECTED Final   Listeria monocytogenes NOT DETECTED NOT DETECTED Final   Staphylococcus species NOT DETECTED NOT DETECTED Final   Staphylococcus aureus (BCID) NOT DETECTED NOT DETECTED Final   Staphylococcus epidermidis NOT DETECTED NOT DETECTED Final   Staphylococcus lugdunensis NOT DETECTED NOT DETECTED Final   Streptococcus species DETECTED (A) NOT DETECTED Final    Comment: Not Enterococcus species, Streptococcus agalactiae, Streptococcus pyogenes, or Streptococcus pneumoniae. CRITICAL RESULT CALLED TO, READ BACK BY AND VERIFIED WITH: J,MILLEN PHARMD @0851  01/05/21 EB    Streptococcus agalactiae NOT DETECTED NOT DETECTED Final   Streptococcus pneumoniae NOT DETECTED NOT DETECTED Final   Streptococcus pyogenes NOT DETECTED NOT DETECTED Final   A.calcoaceticus-baumannii NOT DETECTED NOT DETECTED Final   Bacteroides fragilis NOT DETECTED NOT DETECTED Final   Enterobacterales NOT DETECTED NOT DETECTED Final   Enterobacter cloacae complex NOT DETECTED NOT DETECTED Final   Escherichia coli NOT DETECTED NOT DETECTED Final   Klebsiella aerogenes NOT DETECTED NOT DETECTED Final   Klebsiella oxytoca NOT DETECTED NOT DETECTED Final   Klebsiella pneumoniae NOT DETECTED NOT DETECTED Final   Proteus species NOT DETECTED NOT DETECTED Final   Salmonella species NOT DETECTED NOT DETECTED Final   Serratia marcescens NOT DETECTED NOT DETECTED Final   Haemophilus influenzae NOT DETECTED NOT DETECTED Final   Neisseria meningitidis NOT DETECTED NOT DETECTED Final   Pseudomonas aeruginosa NOT DETECTED  NOT DETECTED Final   Stenotrophomonas maltophilia NOT DETECTED NOT DETECTED Final   Candida albicans NOT DETECTED NOT DETECTED Final   Candida auris NOT DETECTED NOT DETECTED Final   Candida glabrata NOT DETECTED NOT DETECTED Final   Candida krusei NOT DETECTED NOT DETECTED Final   Candida parapsilosis NOT DETECTED NOT DETECTED Final   Candida tropicalis NOT DETECTED NOT DETECTED Final   Cryptococcus neoformans/gattii NOT DETECTED NOT DETECTED Final    Comment: Performed at [facility], [address], [place], [place] [address]  Culture, blood (Routine X 2) w Reflex to ID Panel     Status: Abnormal   Collection Time: 01/04/21  3:11 PM   Specimen: BLOOD LEFT HAND  Result Value Ref Range Status   Specimen Description BLOOD LEFT HAND  Final   Special Requests   Final    BOTTLES DRAWN AEROBIC  ONLY Blood Culture adequate volume   Culture  Setup Time   Final    GRAM POSITIVE COCCI IN CHAINS AEROBIC BOTTLE ONLY CRITICAL VALUE NOTED.  VALUE IS CONSISTENT WITH PREVIOUSLY REPORTED AND CALLED VALUE.    Culture (A)  Final    STREPTOCOCCUS GORDONII SUSCEPTIBILITIES PERFORMED ON PREVIOUS CULTURE WITHIN THE LAST 5 DAYS. Performed at [facility], [place] [address], [place], [place] [address]    Report Status 01/07/2021 FINAL  Final  Resp Panel by RT-PCR (Flu A&B, Covid) Nasopharyngeal Swab     Status: None   Collection Time: 01/05/21  6:31 AM   Specimen: Nasopharyngeal Swab; Nasopharyngeal(NP) swabs in vial transport medium  Result Value Ref Range Status   SARS Coronavirus 2 by RT PCR NEGATIVE NEGATIVE Final    Comment: (NOTE) SARS-CoV-2 target nucleic acids are NOT DETECTED.  The SARS-CoV-2 RNA is generally detectable in upper respiratory specimens during the acute phase of infection. The lowest concentration of SARS-CoV-2 viral copies this assay can detect is 138 copies/mL. A negative result does not preclude SARS-Cov-2 infection and should not be used as the sole basis for  treatment or other patient management decisions. A negative result may occur with  improper specimen collection/handling, submission of specimen other than nasopharyngeal swab, presence of viral mutation(s) within the areas targeted by this assay, and inadequate number of viral copies(<138 copies/mL). A negative result must be combined with clinical observations, patient history, and epidemiological information. The expected result is Negative.  Fact Sheet for Patients:  EntrepreneurPulse.com.au  Fact Sheet for Healthcare Providers:  IncredibleEmployment.be  This test is no t yet approved or cleared by the [place] FDA and  has been authorized for detection and/or diagnosis of SARS-CoV-2 by FDA under an Emergency Use Authorization (EUA). This EUA will remain  in effect (meaning this test can be used) for the duration of the COVID-19 declaration under Section 564(b)(1) of the Act, 21 U.S.C.section 360bbb-3(b)(1), unless the authorization is terminated  or revoked sooner.       Influenza A by PCR NEGATIVE NEGATIVE Final   Influenza B by PCR NEGATIVE NEGATIVE Final    Comment: (NOTE) The Xpert Xpress SARS-CoV-2/FLU/RSV plus assay is intended as an aid in the diagnosis of influenza from Nasopharyngeal swab specimens and should not be used as a sole basis for treatment. Nasal washings and aspirates are unacceptable for Xpert Xpress SARS-CoV-2/FLU/RSV testing.  Fact Sheet for Patients: EntrepreneurPulse.com.au  Fact Sheet for Healthcare Providers: IncredibleEmployment.be  This test is not yet approved or cleared by the [place] FDA and has been authorized for detection and/or diagnosis of SARS-CoV-2 by FDA under an Emergency Use Authorization (EUA). This EUA will remain in effect (meaning this test can be used) for the duration of the COVID-19 declaration under Section 564(b)(1) of the Act, 21  U.S.C. section 360bbb-3(b)(1), unless the authorization is terminated or revoked.  Performed at [facility], [place] [address], [place], [place] [address]   Culture, blood (Routine X 2) w Reflex to ID Panel     Status: None (Preliminary result)   Collection Time: 01/07/21 11:24 AM   Specimen: BLOOD RIGHT FOREARM  Result Value Ref Range Status   Specimen Description BLOOD RIGHT FOREARM  Final   Special Requests   Final    BOTTLES DRAWN AEROBIC AND ANAEROBIC Blood Culture results may not be optimal due to an inadequate volume of blood received in culture bottles   Culture   Final    NO GROWTH  4 DAYS Performed at [facility], [place] [address] [place], [place] [address]    Report Status PENDING  Incomplete  Culture, blood (Routine X 2) w Reflex to ID Panel     Status: None (Preliminary result)   Collection Time: 01/07/21 11:29 AM   Specimen: BLOOD RIGHT HAND  Result Value Ref Range Status   Specimen Description BLOOD RIGHT HAND  Final   Special Requests   Final    BOTTLES DRAWN AEROBIC AND ANAEROBIC Blood Culture results may not be optimal due to an inadequate volume of blood received in culture bottles   Culture   Final    NO GROWTH 4 DAYS Performed at [facility], [place] [address], [place], [place] [address]    Report Status PENDING  Incomplete     Medications:   . sodium chloride   Intravenous Once  . apixaban  5 mg Oral BID  . Chlorhexidine Gluconate Cloth  6 each Topical Daily  . furosemide  40 mg Oral Daily  . levothyroxine  25 mcg Oral q AM  . melatonin  5 mg Oral QHS  . metoprolol succinate  50 mg Oral Daily  . pantoprazole  40 mg Intravenous Q12H  . potassium chloride  10 mEq Oral Daily   Continuous Infusions: . sodium chloride 10 mL/hr at 01/08/21 1039  . gentamicin Stopped (01/11/21 2200)  . penicillin g continuous IV infusion 12 Million Units (01/12/21 0510)      LOS: 9 days   [name]  [facility]  01/12/2021, 7:33 AM

## 2021-01-12 NOTE — Progress Notes (Signed)
RN notified around 1600 by NT that patient's BP was low, 84/57 on third check, pulse 70, lying in bed, nonsymptomatic. MD notified, Tramadol PRN med held, no new orders.   BP rechecked at 1640, BP 84/57, pulse 78. MD paged again. Orders to increase IV fluids from 10 mL/hr to 20 mL/hr and continued monitoring.

## 2021-01-12 NOTE — Plan of Care (Signed)
  Problem: Education: Goal: Knowledge of secondary prevention will improve Outcome: Progressing Goal: Knowledge of patient specific risk factors addressed and post discharge goals established will improve Outcome: Progressing   Problem: Education: Goal: Knowledge of General Education information will improve Description: Including pain rating scale, medication(s)/side effects and non-pharmacologic comfort measures Outcome: Progressing   Problem: Health Behavior/Discharge Planning: Goal: Ability to manage health-related needs will improve Outcome: Progressing   Problem: Clinical Measurements: Goal: Ability to maintain clinical measurements within normal limits will improve Outcome: Progressing Goal: Will remain free from infection Outcome: Progressing Goal: Diagnostic test results will improve Outcome: Progressing Goal: Respiratory complications will improve Outcome: Progressing Goal: Cardiovascular complication will be avoided Outcome: Progressing   Problem: Activity: Goal: Risk for activity intolerance will decrease Outcome: Progressing   Problem: Nutrition: Goal: Adequate nutrition will be maintained Outcome: Progressing   Problem: Coping: Goal: Level of anxiety will decrease Outcome: Progressing   Problem: Elimination: Goal: Will not experience complications related to bowel motility Outcome: Progressing Goal: Will not experience complications related to urinary retention Outcome: Progressing

## 2021-01-12 NOTE — Progress Notes (Signed)
BP soft and MD was paged, awaiting new orders

## 2021-01-13 DIAGNOSIS — M542 Cervicalgia: Secondary | ICD-10-CM | POA: Diagnosis not present

## 2021-01-13 DIAGNOSIS — D62 Acute posthemorrhagic anemia: Secondary | ICD-10-CM | POA: Diagnosis not present

## 2021-01-13 DIAGNOSIS — R519 Headache, unspecified: Secondary | ICD-10-CM | POA: Diagnosis not present

## 2021-01-13 DIAGNOSIS — K045 Chronic apical periodontitis: Secondary | ICD-10-CM

## 2021-01-13 DIAGNOSIS — B954 Other streptococcus as the cause of diseases classified elsewhere: Secondary | ICD-10-CM

## 2021-01-13 DIAGNOSIS — Z5989 Other problems related to housing and economic circumstances: Secondary | ICD-10-CM

## 2021-01-13 DIAGNOSIS — R7881 Bacteremia: Secondary | ICD-10-CM | POA: Diagnosis not present

## 2021-01-13 DIAGNOSIS — I480 Paroxysmal atrial fibrillation: Secondary | ICD-10-CM | POA: Diagnosis not present

## 2021-01-13 DIAGNOSIS — K253 Acute gastric ulcer without hemorrhage or perforation: Secondary | ICD-10-CM | POA: Diagnosis not present

## 2021-01-13 LAB — GLUCOSE, CAPILLARY: Glucose-Capillary: 114 mg/dL — ABNORMAL HIGH (ref 70–99)

## 2021-01-13 MED ORDER — APIXABAN 5 MG PO TABS: 5.0000 mg | ORAL_TABLET | Freq: Two times a day (BID) | ORAL | 3 refills | Status: DC

## 2021-01-13 MED ORDER — MELATONIN 5 MG PO TABS: 5.0000 mg | ORAL_TABLET | Freq: Every day | ORAL | 0 refills | Status: AC

## 2021-01-13 MED ORDER — GENTAMICIN SULFATE 40 MG/ML IJ SOLN: 200.0000 mg | INTRAVENOUS | Status: DC

## 2021-01-13 MED ORDER — PANTOPRAZOLE SODIUM 40 MG PO TBEC
40.0000 mg | DELAYED_RELEASE_TABLET | Freq: Two times a day (BID) | ORAL | Status: DC
  Administered 2021-01-13 – 2021-01-20 (×16): 40 mg via ORAL
  Filled 2021-01-13 (×16): qty 1

## 2021-01-13 MED ORDER — DEXTROSE 5 % IV SOLN: 12.0000 10*6.[IU] | Freq: Two times a day (BID) | INTRAVENOUS | Status: AC

## 2021-01-13 NOTE — Discharge Summary (Signed)
Physician Discharge Summary  Donald Harrington RWE:[id number] DOB: 03-10-1956 DOA: 01/03/2021  PCP: Patient, No Pcp Per  Admit date: 01/03/2021 Discharge date: 01/13/2021  Admitted From: home Disposition:  CIR  Recommendations for Outpatient Follow-up:  1. Follow up with PCP in 1-2 weeks 2. Please obtain BMP/CBC in one week 3. He will need to continue IV penicillin and gentamicin for 2 weeks then 4 additional weeks of penicillin for a total of 6 weeks of antibiotic coverage.   Home Health:no Equipment/Devices:none  Discharge Condition:Stable CODE STATUS:Full Diet recommendation: Heart Healthy   Brief/Interim Summary: 65 y.o. 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  Reports no abdominal pain nausea or vomiting black stools or red stools.  He is found to be tachycardic blood cultures were sent which were positive for Streptococcus ID was consulted  Significant events: EGD on 01/06/2019 due to shortness of gastritis, duodenal ulcer but no evidence of bleeding. Status post 2 units packed red blood cells 01/08/2019 orthopantogram multiple dental caries with residual dentition lucency within the maxilla related to absent tooth 10 which may represent an residual periapical abscess. 01/06/2021 2D echocardiogram showed poor acoustic window EF of 55 MV bioprosthetic valve not well visualized with a mean gradient of 9 mmHg.  AV bioprosthetic valve not well visualized with a mean gradient of 30 mmHg  Antibiotics: 3.21.2022 rocephin 3.21.2022 Gentamacin  3.22.2022 PCN  Microbiology data: 01/04/2021 blood culture: Streptococcus Gondi 01/07/2021 blood cultures negative till  date.  Procedures: 01/08/2021 TEE no vegetation Discharge Diagnoses:  Principal Problem:   Bacteremia due to Streptococcus Active Problems:   Acute embolic stroke [facility])   GI bleed   Acute blood loss anemia   Cough   PAF (paroxysmal atrial fibrillation) (HCC)   History of aortic valve replacement   History of mitral valve replacement   Duodenal ulcer   Acute gastric erosion   Dental caries   Chronic apical periodontitis   Chronic periodontal disease   Acute blood loss anemia/upper GI bleed: Patient was taking Goody powder at home he status post 2 units of packed red blood cells, EGD on 01/05/2021 showed duodenal ulcer but no evidence of bleeding. He was placed on Protonix p.o. twice daily which she tolerated well had no signs of bleeding in house. Will need to follow-up with GI as an outpatient.  Acute multifocal embolism: Likely in the setting of subtherapeutic INR with a history of A. fib. He was transitioned to oral Xarelto which she will continue as an outpatient.  Sepsis due to Streptococcus bacteremia in the setting of periodontal disease: Sepsis physiology resolved. He was started on IV gentamicin and penicillins which she will continue for 2 weeks.  Infectious disease was consulted who agreed with plan and recommended 2 weeks of penicillin and gentamicin and an additional 4 weeks of penicillin on for total of 6 weeks. Orthopantogram was done that showed possible abscess dental was consulted recommended no surgical intervention as an inpatient will need further follow-up as an outpatient. He was done that showed no vegetation.  Paroxysmal atrial fibrillation: Rate control on metoprolol continue Eliquis.  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   Discharge Instructions  Discharge Instructions    Ambulatory referral  to Neurology   Complete by: As directed    Follow up with stroke clinic NP (Jessica Vanschaick or Cecille Rubin, if both not  available, consider Zachery Dauer, or Ahern) at [facility] in about 4 weeks. Thanks.   Diet - low sodium heart healthy   Complete by: As directed    Increase activity slowly   Complete by: As directed      Allergies as of 01/13/2021   No Known Allergies     Medication List    STOP taking these medications   warfarin 5 MG tablet Commonly known as: COUMADIN     TAKE these medications   apixaban 5 MG Tabs tablet Commonly known as: ELIQUIS Take 1 tablet (5 mg total) by mouth 2 (two) times daily.   cyclobenzaprine 10 MG tablet Commonly known as: FLEXERIL Take 10 mg by mouth daily as needed.   Euthyrox 25 MCG tablet Generic drug: levothyroxine Take 25 mcg by mouth daily.   furosemide 20 MG tablet Commonly known as: LASIX Take 40 mg by mouth daily.   gentamicin 200 mg in dextrose 5 % 50 mL Inject 200 mg into the vein daily.   Goodys Extra Strength [id number] MG Pack Generic drug: Aspirin-Acetaminophen-Caffeine Take 1 packet by mouth daily as needed (pain).   lovastatin 20 MG tablet Commonly known as: MEVACOR Take 20 mg by mouth at bedtime.   melatonin 5 MG Tabs Take 1 tablet (5 mg total) by mouth at bedtime.   metoprolol succinate 50 MG 24 hr tablet Commonly known as: TOPROL-XL Take 50 mg by mouth daily.   penicillin G potassium 12 Million Units in dextrose 5 % 500 mL Inject 12 Million Units into the vein every 12 (twelve) hours.   potassium chloride 10 MEQ tablet Commonly known as: KLOR-CON Take 10 mEq by mouth daily.   thiamine 250 MG tablet Take 250 mg by mouth daily.       Follow-up Information    [facility]. Schedule an appointment as soon as possible for a visit in 4 week(s).   Specialty: Neurology Contact information: [address] [place] [phone]             No Known Allergies  Consultations:  Infectious disease  Gastroenterology   Procedures/Studies: DG  Orthopantogram  Result Date: 01/07/2021 CLINICAL DATA:  Endocarditis EXAM: ORTHOPANTOGRAM/PANORAMIC COMPARISON:  None. FINDINGS: The mandible is intact. The maxilla is intact. There is numerous absent dentition involving the maxillary and mandibular molars and the majority of a maxillary and mandibular premolars.z dental caries are identified within tooth 8, the residual fragment of tooth 13, tooth 24, and tooth 25. A lucent lesion is seen within the maxilla within the defect related to the absent tooth 10 demonstrating relatively poorly circumscribed margins and no significant matrix which may represent a residual periapical abscess. IMPRESSION: Multiple dental caries within the residual dentition as described above. Lucency within the maxilla within the defect related to the absent tooth 10 which may represent a a residual periapical abscess. Electronically Signed   By: [name]   On: 01/07/2021 11:20   MR ANGIO HEAD WO CONTRAST  Result Date: 01/04/2021 CLINICAL DATA:  Multiple embolic strokes. EXAM: MRA HEAD WITHOUT CONTRAST TECHNIQUE: Angiographic images of the Circle of Willis were obtained using MRA technique without intravenous contrast. COMPARISON:  MRI yesterday. FINDINGS: Both internal carotid arteries are patent through the skull base and siphon regions. The right ICA supplies the right middle cerebral artery  territory and both anterior cerebral artery territories. No evidence of large vessel occlusion. No correctable proximal stenosis. Left internal carotid artery supplies the left middle cerebral artery territory and the left PCA. No large vessel occlusion. Both vertebral arteries are patent to the basilar. No basilar stenosis. Posterior circulation branch vessels show flow. Left PCA takes fetal origin from the anterior circulation, as noted above. IMPRESSION: No large or medium vessel occlusion or correctable proximal stenosis. Electronically Signed   By: Nelson Chimes M.D.   On:  01/04/2021 16:47   MR CERVICAL SPINE WO CONTRAST  Result Date: 01/10/2021 CLINICAL DATA:  Initial evaluation for acute neck pain, infection suspected. EXAM: MRI CERVICAL SPINE WITHOUT CONTRAST TECHNIQUE: Multiplanar, multisequence MR imaging of the cervical spine was performed. No intravenous contrast was administered. COMPARISON:  Prior MRI from 12/05/2020. FINDINGS: Alignment: Examination somewhat degraded by motion artifact. Straightening with slight reversal of the normal cervical lordosis. Trace anterolisthesis of C3 on C4, stable. Vertebrae: Vertebral body height maintained without acute or interval fracture. Bone marrow signal intensity diffusely heterogeneous without worrisome osseous lesion. Discogenic reactive endplate changes present about the C5-6 through C7-T1 interspaces, with partial ankylosis across the C6-7 disc space. Appearance is stable from previous. No signal changes to suggest osteomyelitis discitis or septic arthritis. Cord: Normal signal and morphology.  No epidural collections. Posterior Fossa, vertebral arteries, paraspinal tissues: Visualized brain and posterior fossa within normal limits. Craniocervical junction normal. Paraspinous and prevertebral soft tissues within normal limits. Normal flow voids seen within the vertebral arteries bilaterally. Disc levels: C2-C3: Small central disc protrusion mildly indents the ventral thecal sac, slightly asymmetric to the left. No significant spinal stenosis or cord deformity. Superimposed mild left greater than right facet degeneration without significant foraminal stenosis. Appearance is stable. C3-C4: Trace anterolisthesis. Central disc protrusion indents the ventral thecal sac, contacting and mildly flattening the ventral spinal cord. No cord signal changes or significant spinal stenosis. Superimposed left greater than right uncovertebral and facet hypertrophy with resultant mild left C4 foraminal stenosis. Right neural foramina remains  patent. Appearance is stable. C4-C5: Degenerative intervertebral disc space narrowing with diffuse disc osteophyte complex, eccentric to the left. Broad posterior component flattens and partially effaces the ventral thecal sac with resultant mild spinal stenosis. Mild cord flattening without cord signal changes. Superimposed bilateral facet hypertrophy with resultant moderate to severe bilateral C5 foraminal narrowing. Appearance is stable. C5-C6: Degenerative intervertebral disc space narrowing with diffuse disc osteophyte complex, asymmetric to the left. Broad posterior component flattens and partially faces the ventral thecal sac. Mild flattening of the ventral cord, greater on the left. No cord signal changes. Mild spinal stenosis is stable. Moderate bilateral C6 foraminal narrowing, worse on the right, also stable. C6-C7: Advanced degenerative intervertebral disc space narrowing with partial ankylosis across the C6-7 interspace. Right greater than left uncovertebral spurring. Posterior disc osteophyte flattens and partially effaces the ventral thecal sac without significant stenosis or cord impingement. Superimposed mild facet hypertrophy. Mild bilateral C7 foraminal stenosis is relatively stable. C7-T1: Degenerative intervertebral disc space narrowing with diffuse disc osteophyte complex. Flattening and partial effacement of the ventral thecal sac without significant spinal stenosis or cord deformity. Superimposed bilateral facet hypertrophy. Moderate left with mild right C8 foraminal narrowing, stable. Visualized upper thoracic spine demonstrates no significant finding. IMPRESSION: 1. No MRI evidence for acute infection or other abnormality within the cervical spine. 2. Multilevel cervical spondylosis with resultant mild spinal stenosis at C4-5 and C5-6. 3. Multifactorial degenerative changes with resultant multilevel foraminal narrowing as above.  Notable findings include moderate to severe bilateral C5  foraminal narrowing, with moderate bilateral C6 and left C8 foraminal stenosis. Electronically Signed   By: Jeannine Boga M.D.   On: 01/10/2021 23:15   DG CHEST PORT 1 VIEW  Result Date: 01/05/2021 CLINICAL DATA:  Shortness of breath EXAM: PORTABLE CHEST 1 VIEW COMPARISON:  01/03/2021 FINDINGS: Prior median sternotomy and valve replacement. Heart is normal size. Hyperinflation/COPD. Bibasilar opacities. No effusions. No acute bony abnormality. IMPRESSION: COPD. Bibasilar atelectasis or infiltrates, new since prior study. Electronically Signed   By: Rolm Baptise M.D.   On: 01/05/2021 15:35   DG CHEST PORT 1 VIEW  Result Date: 01/03/2021 CLINICAL DATA:  Cough.  Acute blood loss. EXAM: PORTABLE CHEST 1 VIEW COMPARISON:  January 03, 2021 FINDINGS: Stable sternotomy wires and cardiomegaly. The hila and mediastinum are unremarkable. Skin fold over the upper lateral left chest. No pneumothorax. No nodules or masses. Mild interstitial prominence without overt edema. No focal infiltrate. IMPRESSION: Probable mild pulmonary venous congestion. No other acute abnormalities. Electronically Signed   By: Dorise Bullion III M.D   On: 01/03/2021 21:21   ECHOCARDIOGRAM COMPLETE  Result Date: 01/06/2021    ECHOCARDIOGRAM REPORT   Patient Name:   Donald Harrington Date of Exam: 01/05/2021 Medical Rec #:  [id number]    Height:       70.5 in Accession #:    [id number]   Weight:       165.0 lb Date of Birth:  [birth year]    BSA:          1.934 m Patient Age:    31 years     BP:           109/68 mmHg Patient Gender: M            HR:           110 bpm. Exam Location:  Inpatient Procedure: 2D Echo Indications:    Stroke  History:        Patient has no prior history of Echocardiogram examinations.                 Aortic and mitral replacement in 2015 at [facility] due                 to endocarditis; Aortic Valve Disease, Mitral Valve Disease and                 Endocarditis.  Sonographer:    Merrie Roof RDCS Referring  Phys: [place]  1. Poor acoustic windows. Cannot exclude vegetations. Would recomm TEE to further evaluate if clinically indicated.  2. Left ventricular ejection fraction, by estimation, is 55 to 60%. The left ventricle has normal function. The left ventricle has no regional wall motion abnormalities. Indeterminate diastolic filling due to E-A fusion.  3. Right ventricular systolic function is normal. The right ventricular size is normal.  4. Left atrial size was mild to moderately dilated.  5. MV prosthesis (Epic bioprosthesis, 05/01/14) Not well visualized. Peak and mean gradients through the valveare 19 and 9 mm Hx respectively. Compared to echo report from 06/05/19 [facility]) mild increase in mean gradient (6 to 9 mm). The mitral valve has been repaired/replaced. Trivial mitral valve regurgitation.  6. AV prosthesis (23 mm Hancock bioprosthesis, 05/01/14) present Not well visualized. Peak and mean gradients through the valve are 51 and 30 mm Hg respectively Comparee to reprot for 8/117/20 [facility]), mean graidnet is increased. . The aortic  valve has been repaired/replaced. Aortic valve regurgitation is trivial.  7. The inferior vena cava is normal in size with greater than 50% respiratory variability, suggesting right atrial pressure of 3 mmHg. FINDINGS  Left Ventricle: Left ventricular ejection fraction, by estimation, is 55 to 60%. The left ventricle has normal function. The left ventricle has no regional wall motion abnormalities. The left ventricular internal cavity size was normal in size. There is  no left ventricular hypertrophy. Indeterminate diastolic filling due to E-A fusion. Right Ventricle: The right ventricular size is normal. Right vetricular wall thickness was not assessed. Right ventricular systolic function is normal. Left Atrium: Left atrial size was mild to moderately dilated. Right Atrium: Right atrial size was normal in size. Pericardium: There is no evidence of pericardial  effusion. Mitral Valve: MV prosthesis (Epic bioprosthesis, 05/01/14) Not well visualized peak and mean gradients through the valveare 19 and 9 mm Hx respectively. Compared to echo reprot for 06/05/19 [facility]) mild increase in mean gradient (6 to 9 mm). The mitral valve has been repaired/replaced. Trivial mitral valve regurgitation. MV peak gradient, 18.5 mmHg. The mean mitral valve gradient is 9.0 mmHg. Tricuspid Valve: The tricuspid valve is grossly normal. Tricuspid valve regurgitation is mild. Aortic Valve: AV prosthesis (23 mm Hancock bioprosthesis, 05/01/14) present Not well visualized. Peak and mean gradients through the valve are 51 and 30 mm Hg respectively Comparee to reprot for 8/117/20 [facility]), mean graidnet is increased. The aortic valve has been repaired/replaced. Aortic valve regurgitation is trivial. Aortic valve mean gradient measures 30.0 mmHg. Aortic valve peak gradient measures 50.7 mmHg. Aortic valve area, by VTI measures 0.43 cm. Pulmonic Valve: The pulmonic valve was not well visualized. Pulmonic valve regurgitation is not visualized. Aorta: The aortic root is normal in size and structure. Venous: The inferior vena cava is normal in size with greater than 50% respiratory variability, suggesting right atrial pressure of 3 mmHg. IAS/Shunts: The interatrial septum was not assessed.  LEFT VENTRICLE PLAX 2D LVIDd:         5.10 cm  Diastology LVIDs:         3.70 cm  LV e' lateral: 10.30 cm/s LV PW:         0.80 cm LV IVS:        1.00 cm LVOT diam:     2.00 cm LV SV:         33 LV SV Index:   17 LVOT Area:     3.14 cm  RIGHT VENTRICLE          IVC RV Basal diam:  3.80 cm  IVC diam: 1.90 cm LEFT ATRIUM             Index       RIGHT ATRIUM           Index LA diam:        5.20 cm 2.69 cm/m  RA Area:     17.40 cm LA Vol (A2C):   64.0 ml 33.10 ml/m RA Volume:   45.50 ml  23.53 ml/m LA Vol (A4C):   95.3 ml 49.29 ml/m LA Biplane Vol: 82.6 ml 42.72 ml/m  AORTIC VALVE AV Area (Vmax):    0.67 cm AV Area  (Vmean):   0.50 cm AV Area (VTI):     0.43 cm AV Vmax:           356.00 cm/s AV Vmean:          262.000 cm/s AV VTI:  0.752 m AV Peak Grad:      50.7 mmHg AV Mean Grad:      30.0 mmHg LVOT Vmax:         75.50 cm/s LVOT Vmean:        42.000 cm/s LVOT VTI:          0.104 m LVOT/AV VTI ratio: 0.14  AORTA Ao Root diam: 3.10 cm MITRAL VALVE             TRICUSPID VALVE MV Area VTI:  0.60 cm   TR Peak grad:   30.9 mmHg MV Peak grad: 18.5 mmHg  TR Vmax:        278.00 cm/s MV Mean grad: 9.0 mmHg MV Vmax:      2.15 m/s   SHUNTS MV Vmean:     139.0 cm/s Systemic VTI:  0.10 m                          Systemic Diam: 2.00 cm Dorris Carnes MD Electronically signed by Dorris Carnes MD Signature Date/Time: 01/06/2021/4:04:54 PM    Final    ECHO TEE  Result Date: 01/08/2021    TRANSESOPHOGEAL ECHO REPORT   Patient Name:   Donald Harrington Date of Exam: 01/08/2021 Medical Rec #:  [id number]    Height:       70.5 in Accession #:    [id number]   Weight:       175.9 lb Date of Birth:  1956-01-10    BSA:          1.987 m Patient Age:    86 years     BP:           86/59 mmHg Patient Gender: M            HR:           91 bpm. Exam Location:  Inpatient Procedure: Transesophageal Echo, Cardiac Doppler and Color Doppler Indications:     Bacteremia  History:         Patient has prior history of Echocardiogram examinations, most                  recent 01/06/2021. Previous Myocardial Infarction, Stroke,                  Endocarditis and AVR. MVR, Arrythmias:Atrial Fibrillation,                  Signs/Symptoms:Bacteremia; Risk Factors:Hypertension.  Sonographer:     Dustin Flock Referring Phys:  [id number] Tami Lin [facility] Diagnosing Phys: Mertie Moores MD PROCEDURE: The transesophogeal probe was passed without difficulty through the esophogus of the patient. Sedation performed by performing physician. The patient was monitored while under deep sedation. Anesthestetic sedation was provided intravenously by  Anesthesiology: 233.59mg  of  Propofol, 80mg  of Lidocaine. The patient developed no complications during the procedure. IMPRESSIONS  1. Left ventricular ejection fraction, by estimation, is 55 to 60%. The left ventricle has normal function.  2. Right ventricular systolic function is normal. The right ventricular size is normal.  3. No left atrial/left atrial appendage thrombus was detected.  4. The mitral valve has been repaired/replaced. No evidence of mitral valve regurgitation. No evidence of mitral stenosis.  5. The tricuspid valve is abnormal. Tricuspid valve regurgitation is moderate.  6. The aortic valve has been repaired/replaced. Aortic valve regurgitation is not visualized. No aortic stenosis is present. FINDINGS  Left Ventricle: Left ventricular ejection fraction, by estimation,  is 55 to 60%. The left ventricle has normal function. The left ventricular internal cavity size was normal in size. Right Ventricle: The right ventricular size is normal. No increase in right ventricular wall thickness. Right ventricular systolic function is normal. Left Atrium: Left atrial size was normal in size. No left atrial/left atrial appendage thrombus was detected. Right Atrium: Right atrial size was normal in size. Pericardium: There is no evidence of pericardial effusion. Mitral Valve: The mitral valve has been repaired/replaced. No evidence of mitral valve regurgitation. There is a bioprosthetic valve present in the mitral position. No evidence of mitral valve stenosis. There is no evidence of mitral valve vegetation. Tricuspid Valve: The tricuspid valve is abnormal. Tricuspid valve regurgitation is moderate. Aortic Valve: The aortic valve has been repaired/replaced. Aortic valve regurgitation is not visualized. No aortic stenosis is present. There is a bioprosthetic valve present in the aortic position. There is no evidence of aortic valve vegetation. Pulmonic Valve: The pulmonic valve was grossly normal. Pulmonic valve regurgitation is trivial.  Aorta: The aortic root and ascending aorta are structurally normal, with no evidence of dilitation. There is minimal (Grade I) plaque. IAS/Shunts: No atrial level shunt detected by color flow Doppler. Mertie Moores MD Electronically signed by Mertie Moores MD Signature Date/Time: 01/08/2021/3:13:00 PM    Final    VAS US CAROTID  Result Date: 01/07/2021 Carotid Arterial Duplex Study Indications:       CVA. Risk Factors:      Hypertension, coronary artery disease, prior CVA. 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                    MVR and AVR 2015. PAF, on Coumadin. Comparison Study:  No prior study Performing Technologist: Sharion Dove RVS  Examination Guidelines: A complete evaluation includes B-mode imaging, spectral Doppler, color Doppler, and power Doppler as needed of all accessible portions of each vessel. Bilateral testing is considered an integral part of a complete examination. Limited examinations for reoccurring indications may be performed as noted.  Right Carotid Findings: +----------+--------+--------+--------+------------------+------------------+           PSV cm/sEDV cm/sStenosisPlaque DescriptionComments           +----------+--------+--------+--------+------------------+------------------+ CCA Prox  79      32                                intimal thickening +----------+--------+--------+--------+------------------+------------------+ CCA Distal82      30                                intimal thickening +----------+--------+--------+--------+------------------+------------------+ ICA Prox  128     49      40-59%  calcific          Shadowing          +----------+--------+--------+--------+------------------+------------------+ ICA Distal101     38                                                   +----------+--------+--------+--------+------------------+------------------+ ECA       134     20                                                    +----------+--------+--------+--------+------------------+------------------+ +----------+--------+-------+--------+-------------------+  PSV cm/sEDV cmsDescribeArm Pressure (mmHG) +----------+--------+-------+--------+-------------------+ Subclavian162                                        +----------+--------+-------+--------+-------------------+ +---------+--------+--+--------+--+ VertebralPSV cm/s69EDV cm/s21 +---------+--------+--+--------+--+  Left Carotid Findings: +----------+--------+--------+--------+------------------+---------+           PSV cm/sEDV cm/sStenosisPlaque DescriptionComments  +----------+--------+--------+--------+------------------+---------+ CCA Prox  140     48              heterogenous                +----------+--------+--------+--------+------------------+---------+ CCA Distal131     32              heterogenous                +----------+--------+--------+--------+------------------+---------+ ICA Prox  140     48      40-59%  calcific          Shadowing +----------+--------+--------+--------+------------------+---------+ ICA Mid   108     48                                          +----------+--------+--------+--------+------------------+---------+ ICA Distal101     38                                          +----------+--------+--------+--------+------------------+---------+ ECA       135     37                                          +----------+--------+--------+--------+------------------+---------+ +----------+--------+--------+--------+-------------------+           PSV cm/sEDV cm/sDescribeArm Pressure (mmHG) +----------+--------+--------+--------+-------------------+ [id number]                                          +----------+--------+--------+--------+-------------------+ +---------+--------+--+--------+--+ VertebralPSV cm/s58EDV cm/s22  +---------+--------+--+--------+--+   Summary: Right Carotid: Velocities in the right ICA are consistent with a 40-59%                stenosis. Left Carotid: Velocities in the left ICA are consistent with a 40-59% stenosis. Vertebrals:  Bilateral vertebral arteries demonstrate antegrade flow. Subclavians: Normal flow hemodynamics were seen in bilateral subclavian              arteries. *See table(s) above for measurements and observations.  Electronically signed by Antony Contras MD on 01/07/2021 at 9:02:50 AM.    Final    [place] EKG SITE RITE  Result Date: 01/10/2021 If Site Rite image not attached, placement could not be confirmed due to current cardiac rhythm.    Subjective: No new complaints.  Discharge Exam: Vitals:   01/13/21 0351 01/13/21 0828  BP: 101/71 100/64  Pulse: 96 84  Resp: 19 16  Temp: 98.6 F (37 C) 98.7 F (37.1 C)  SpO2: 95% 94%   Vitals:   01/12/21 2344 01/13/21 0351 01/13/21 0500 01/13/21 0828  BP: 98/65 101/71  100/64  Pulse: 87 96  84  Resp: 19 19  16   Temp:  98.2 F (36.8 C) 98.6 F (37 C)  98.7 F (37.1 C)  TempSrc: Oral Oral  Oral  SpO2: 94% 95%  94%  Weight:   77.7 kg   Height:        General: Pt is alert, awake, not in acute distress Cardiovascular: RRR, S1/S2 +, no rubs, no gallops Respiratory: CTA bilaterally, no wheezing, no rhonchi Abdominal: Soft, NT, ND, bowel sounds + Extremities: no edema, no cyanosis    The results of significant diagnostics from this hospitalization (including imaging, microbiology, ancillary and laboratory) are listed below for reference.     Microbiology: Recent Results (from the past 240 hour(s))  Culture, blood (Routine X 2) w Reflex to ID Panel     Status: Abnormal   Collection Time: 01/04/21  3:04 PM   Specimen: BLOOD LEFT HAND  Result Value Ref Range Status   Specimen Description BLOOD LEFT HAND  Final   Special Requests   Final    BOTTLES DRAWN AEROBIC AND ANAEROBIC Blood Culture results may not be optimal  due to an inadequate volume of blood received in culture bottles   Culture  Setup Time   Final    GRAM POSITIVE COCCI IN CHAINS IN BOTH AEROBIC AND ANAEROBIC BOTTLES CRITICAL RESULT CALLED TO, READ BACK BY AND VERIFIED WITH: J,MILLEN PHARMD @0851  01/05/21 EB Performed at [facility], [place] [address] [place], [place] [address]    Culture STREPTOCOCCUS GORDONII (A)  Final   Report Status 01/07/2021 FINAL  Final   Organism ID, Bacteria STREPTOCOCCUS GORDONII  Final      Susceptibility   Streptococcus gordonii - MIC*    PENICILLIN <=0.06 SENSITIVE Sensitive     CEFTRIAXONE <=0.12 SENSITIVE Sensitive     ERYTHROMYCIN 2 RESISTANT Resistant     LEVOFLOXACIN 1 SENSITIVE Sensitive     VANCOMYCIN 0.5 SENSITIVE Sensitive     * STREPTOCOCCUS GORDONII  Blood Culture ID Panel (Reflexed)     Status: Abnormal   Collection Time: 01/04/21  3:04 PM  Result Value Ref Range Status   Enterococcus faecalis NOT DETECTED NOT DETECTED Final   Enterococcus Faecium NOT DETECTED NOT DETECTED Final   Listeria monocytogenes NOT DETECTED NOT DETECTED Final   Staphylococcus species NOT DETECTED NOT DETECTED Final   Staphylococcus aureus (BCID) NOT DETECTED NOT DETECTED Final   Staphylococcus epidermidis NOT DETECTED NOT DETECTED Final   Staphylococcus lugdunensis NOT DETECTED NOT DETECTED Final   Streptococcus species DETECTED (A) NOT DETECTED Final    Comment: Not Enterococcus species, Streptococcus agalactiae, Streptococcus pyogenes, or Streptococcus pneumoniae. CRITICAL RESULT CALLED TO, READ BACK BY AND VERIFIED WITH: J,MILLEN PHARMD @0851  01/05/21 EB    Streptococcus agalactiae NOT DETECTED NOT DETECTED Final   Streptococcus pneumoniae NOT DETECTED NOT DETECTED Final   Streptococcus pyogenes NOT DETECTED NOT DETECTED Final   A.calcoaceticus-baumannii NOT DETECTED NOT DETECTED Final   Bacteroides fragilis NOT DETECTED NOT DETECTED Final   Enterobacterales NOT DETECTED NOT DETECTED Final    Enterobacter cloacae complex NOT DETECTED NOT DETECTED Final   Escherichia coli NOT DETECTED NOT DETECTED Final   Klebsiella aerogenes NOT DETECTED NOT DETECTED Final   Klebsiella oxytoca NOT DETECTED NOT DETECTED Final   Klebsiella pneumoniae NOT DETECTED NOT DETECTED Final   Proteus species NOT DETECTED NOT DETECTED Final   Salmonella species NOT DETECTED NOT DETECTED Final   Serratia marcescens NOT DETECTED NOT DETECTED Final   Haemophilus influenzae NOT DETECTED NOT DETECTED Final   Neisseria meningitidis NOT DETECTED NOT DETECTED Final  Pseudomonas aeruginosa NOT DETECTED NOT DETECTED Final   Stenotrophomonas maltophilia NOT DETECTED NOT DETECTED Final   Candida albicans NOT DETECTED NOT DETECTED Final   Candida auris NOT DETECTED NOT DETECTED Final   Candida glabrata NOT DETECTED NOT DETECTED Final   Candida krusei NOT DETECTED NOT DETECTED Final   Candida parapsilosis NOT DETECTED NOT DETECTED Final   Candida tropicalis NOT DETECTED NOT DETECTED Final   Cryptococcus neoformans/gattii NOT DETECTED NOT DETECTED Final    Comment: Performed at [facility], [place] [address] [place], [place] [address]  Culture, blood (Routine X 2) w Reflex to ID Panel     Status: Abnormal   Collection Time: 01/04/21  3:11 PM   Specimen: BLOOD LEFT HAND  Result Value Ref Range Status   Specimen Description BLOOD LEFT HAND  Final   Special Requests   Final    BOTTLES DRAWN AEROBIC ONLY Blood Culture adequate volume   Culture  Setup Time   Final    GRAM POSITIVE COCCI IN CHAINS AEROBIC BOTTLE ONLY CRITICAL VALUE NOTED.  VALUE IS CONSISTENT WITH PREVIOUSLY REPORTED AND CALLED VALUE.    Culture (A)  Final    STREPTOCOCCUS GORDONII SUSCEPTIBILITIES PERFORMED ON PREVIOUS CULTURE WITHIN THE LAST 5 DAYS. Performed at [facility], [place] [address], [place], [place] [address]    Report Status 01/07/2021 FINAL  Final  Resp Panel by RT-PCR (Flu A&B, Covid) Nasopharyngeal Swab     Status: None    Collection Time: 01/05/21  6:31 AM   Specimen: Nasopharyngeal Swab; Nasopharyngeal(NP) swabs in vial transport medium  Result Value Ref Range Status   SARS Coronavirus 2 by RT PCR NEGATIVE NEGATIVE Final    Comment: (NOTE) SARS-CoV-2 target nucleic acids are NOT DETECTED.  The SARS-CoV-2 RNA is generally detectable in upper respiratory specimens during the acute phase of infection. The lowest concentration of SARS-CoV-2 viral copies this assay can detect is 138 copies/mL. A negative result does not preclude SARS-Cov-2 infection and should not be used as the sole basis for treatment or other patient management decisions. A negative result may occur with  improper specimen collection/handling, submission of specimen other than nasopharyngeal swab, presence of viral mutation(s) within the areas targeted by this assay, and inadequate number of viral copies(<138 copies/mL). A negative result must be combined with clinical observations, patient history, and epidemiological information. The expected result is Negative.  Fact Sheet for Patients:  EntrepreneurPulse.com.au  Fact Sheet for Healthcare Providers:  IncredibleEmployment.be  This test is no t yet approved or cleared by the [place] FDA and  has been authorized for detection and/or diagnosis of SARS-CoV-2 by FDA under an Emergency Use Authorization (EUA). This EUA will remain  in effect (meaning this test can be used) for the duration of the COVID-19 declaration under Section 564(b)(1) of the Act, 21 U.S.C.section 360bbb-3(b)(1), unless the authorization is terminated  or revoked sooner.       Influenza A by PCR NEGATIVE NEGATIVE Final   Influenza B by PCR NEGATIVE NEGATIVE Final    Comment: (NOTE) The Xpert Xpress SARS-CoV-2/FLU/RSV plus assay is intended as an aid in the diagnosis of influenza from Nasopharyngeal swab specimens and should not be used as a sole basis for treatment.  Nasal washings and aspirates are unacceptable for Xpert Xpress SARS-CoV-2/FLU/RSV testing.  Fact Sheet for Patients: EntrepreneurPulse.com.au  Fact Sheet for Healthcare Providers: IncredibleEmployment.be  This test is not yet approved or cleared by the [place] and has been authorized for  detection and/or diagnosis of SARS-CoV-2 by FDA under an Emergency Use Authorization (EUA). This EUA will remain in effect (meaning this test can be used) for the duration of the COVID-19 declaration under Section 564(b)(1) of the Act, 21 U.S.C. section 360bbb-3(b)(1), unless the authorization is terminated or revoked.  Performed at [facility], [place] [address] [place], [place] [address]   Culture, blood (Routine X 2) w Reflex to ID Panel     Status: None   Collection Time: 01/07/21 11:24 AM   Specimen: BLOOD RIGHT FOREARM  Result Value Ref Range Status   Specimen Description BLOOD RIGHT FOREARM  Final   Special Requests   Final    BOTTLES DRAWN AEROBIC AND ANAEROBIC Blood Culture results may not be optimal due to an inadequate volume of blood received in culture bottles   Culture   Final    NO GROWTH 5 DAYS Performed at [facility], [place] [address], [place], [place] [address]    Report Status 01/12/2021 FINAL  Final  Culture, blood (Routine X 2) w Reflex to ID Panel     Status: None   Collection Time: 01/07/21 11:29 AM   Specimen: BLOOD RIGHT HAND  Result Value Ref Range Status   Specimen Description BLOOD RIGHT HAND  Final   Special Requests   Final    BOTTLES DRAWN AEROBIC AND ANAEROBIC Blood Culture results may not be optimal due to an inadequate volume of blood received in culture bottles   Culture   Final    NO GROWTH 5 DAYS Performed at [facility], [place] [address], [place], [place] [address]    Report Status 01/12/2021 FINAL  Final     Labs: BNP (last 3 results) Recent Labs    01/04/21 0322  BNP 648.0*    Basic Metabolic Panel: Recent Labs  Lab 01/07/21 0323 01/08/21 0640 01/10/21 0325 01/12/21 0213  NA 139 138 137 136  K 4.0 4.2 4.8 4.2  CL 109 107 107 102  CO2 23 27 27 28   GLUCOSE 103* 92 96 88  BUN 12 10 9 9   CREATININE 1.01 0.93 1.11 1.14  CALCIUM 7.7* 8.0* 8.3* 8.6*   Liver Function Tests: Recent Labs  Lab 01/07/21 0323 01/08/21 0640  AST 12* 12*  ALT 11 9  ALKPHOS 39 38  BILITOT 0.4 0.6  PROT 4.8* 4.9*  ALBUMIN 2.0* 2.1*   No results for input(s): LIPASE, AMYLASE in the last 168 hours. No results for input(s): AMMONIA in the last 168 hours. CBC: Recent Labs  Lab 01/07/21 0323 01/08/21 0640 01/10/21 0325 01/11/21 0321 01/12/21 0213  WBC 3.7* 4.0 4.9 4.6 4.5  HGB 8.8* 9.1* 9.0* 8.9* 9.4*  HCT 26.7* 27.4* 28.2* 28.1* 28.3*  MCV 101.1* 101.1* 104.4* 102.9* 101.4*  PLT 125* 145* 156 164 172   Cardiac Enzymes: No results for input(s): CKTOTAL, CKMB, CKMBINDEX, TROPONINI in the last 168 hours. BNP: Invalid input(s): POCBNP CBG: No results for input(s): GLUCAP in the last 168 hours. D-Dimer No results for input(s): DDIMER in the last 72 hours. Hgb A1c No results for input(s): HGBA1C in the last 72 hours. Lipid Profile No results for input(s): CHOL, HDL, LDLCALC, TRIG, CHOLHDL, LDLDIRECT in the last 72 hours. Thyroid function studies No results for input(s): TSH, T4TOTAL, T3FREE, THYROIDAB in the last 72 hours.  Invalid input(s): FREET3 Anemia work up No results for input(s): VITAMINB12, FOLATE, FERRITIN, TIBC, IRON, RETICCTPCT in the last 72 hours. Urinalysis    Component  Value Date/Time   COLORURINE YELLOW 01/04/2021 1729   APPEARANCEUR CLEAR 01/04/2021 1729   LABSPEC 1.012 01/04/2021 1729   PHURINE 5.0 01/04/2021 1729   GLUCOSEU NEGATIVE 01/04/2021 1729   HGBUR MODERATE (A) 01/04/2021 1729   BILIRUBINUR NEGATIVE 01/04/2021 1729   KETONESUR NEGATIVE 01/04/2021 1729   PROTEINUR NEGATIVE 01/04/2021 1729   NITRITE NEGATIVE 01/04/2021 1729    LEUKOCYTESUR NEGATIVE 01/04/2021 1729   Sepsis Labs Invalid input(s): PROCALCITONIN,  WBC,  LACTICIDVEN Microbiology Recent Results (from the past 240 hour(s))  Culture, blood (Routine X 2) w Reflex to ID Panel     Status: Abnormal   Collection Time: 01/04/21  3:04 PM   Specimen: BLOOD LEFT HAND  Result Value Ref Range Status   Specimen Description BLOOD LEFT HAND  Final   Special Requests   Final    BOTTLES DRAWN AEROBIC AND ANAEROBIC Blood Culture results may not be optimal due to an inadequate volume of blood received in culture bottles   Culture  Setup Time   Final    GRAM POSITIVE COCCI IN CHAINS IN BOTH AEROBIC AND ANAEROBIC BOTTLES CRITICAL RESULT CALLED TO, READ BACK BY AND VERIFIED WITH: J,MILLEN PHARMD @0851  01/05/21 EB Performed at [facility], [place] [address] [place], [place] [address]    Culture STREPTOCOCCUS GORDONII (A)  Final   Report Status 01/07/2021 FINAL  Final   Organism ID, Bacteria STREPTOCOCCUS GORDONII  Final      Susceptibility   Streptococcus gordonii - MIC*    PENICILLIN <=0.06 SENSITIVE Sensitive     CEFTRIAXONE <=0.12 SENSITIVE Sensitive     ERYTHROMYCIN 2 RESISTANT Resistant     LEVOFLOXACIN 1 SENSITIVE Sensitive     VANCOMYCIN 0.5 SENSITIVE Sensitive     * STREPTOCOCCUS GORDONII  Blood Culture ID Panel (Reflexed)     Status: Abnormal   Collection Time: 01/04/21  3:04 PM  Result Value Ref Range Status   Enterococcus faecalis NOT DETECTED NOT DETECTED Final   Enterococcus Faecium NOT DETECTED NOT DETECTED Final   Listeria monocytogenes NOT DETECTED NOT DETECTED Final   Staphylococcus species NOT DETECTED NOT DETECTED Final   Staphylococcus aureus (BCID) NOT DETECTED NOT DETECTED Final   Staphylococcus epidermidis NOT DETECTED NOT DETECTED Final   Staphylococcus lugdunensis NOT DETECTED NOT DETECTED Final   Streptococcus species DETECTED (A) NOT DETECTED Final    Comment: Not Enterococcus species, Streptococcus agalactiae, Streptococcus  pyogenes, or Streptococcus pneumoniae. CRITICAL RESULT CALLED TO, READ BACK BY AND VERIFIED WITH: J,MILLEN PHARMD @0851  01/05/21 EB    Streptococcus agalactiae NOT DETECTED NOT DETECTED Final   Streptococcus pneumoniae NOT DETECTED NOT DETECTED Final   Streptococcus pyogenes NOT DETECTED NOT DETECTED Final   A.calcoaceticus-baumannii NOT DETECTED NOT DETECTED Final   Bacteroides fragilis NOT DETECTED NOT DETECTED Final   Enterobacterales NOT DETECTED NOT DETECTED Final   Enterobacter cloacae complex NOT DETECTED NOT DETECTED Final   Escherichia coli NOT DETECTED NOT DETECTED Final   Klebsiella aerogenes NOT DETECTED NOT DETECTED Final   Klebsiella oxytoca NOT DETECTED NOT DETECTED Final   Klebsiella pneumoniae NOT DETECTED NOT DETECTED Final   Proteus species NOT DETECTED NOT DETECTED Final   Salmonella species NOT DETECTED NOT DETECTED Final   Serratia marcescens NOT DETECTED NOT DETECTED Final   Haemophilus influenzae NOT DETECTED NOT DETECTED Final   Neisseria meningitidis NOT DETECTED NOT DETECTED Final   Pseudomonas aeruginosa NOT DETECTED NOT DETECTED Final   Stenotrophomonas maltophilia NOT DETECTED NOT DETECTED Final   Candida albicans NOT DETECTED  NOT DETECTED Final   Candida auris NOT DETECTED NOT DETECTED Final   Candida glabrata NOT DETECTED NOT DETECTED Final   Candida krusei NOT DETECTED NOT DETECTED Final   Candida parapsilosis NOT DETECTED NOT DETECTED Final   Candida tropicalis NOT DETECTED NOT DETECTED Final   Cryptococcus neoformans/gattii NOT DETECTED NOT DETECTED Final    Comment: Performed at [facility], [place] [address], [place], [place] [address]  Culture, blood (Routine X 2) w Reflex to ID Panel     Status: Abnormal   Collection Time: 01/04/21  3:11 PM   Specimen: BLOOD LEFT HAND  Result Value Ref Range Status   Specimen Description BLOOD LEFT HAND  Final   Special Requests   Final    BOTTLES DRAWN AEROBIC ONLY Blood Culture adequate volume   Culture   Setup Time   Final    GRAM POSITIVE COCCI IN CHAINS AEROBIC BOTTLE ONLY CRITICAL VALUE NOTED.  VALUE IS CONSISTENT WITH PREVIOUSLY REPORTED AND CALLED VALUE.    Culture (A)  Final    STREPTOCOCCUS GORDONII SUSCEPTIBILITIES PERFORMED ON PREVIOUS CULTURE WITHIN THE LAST 5 DAYS. Performed at [facility], [place] [address], [place], [place] [address]    Report Status 01/07/2021 FINAL  Final  Resp Panel by RT-PCR (Flu A&B, Covid) Nasopharyngeal Swab     Status: None   Collection Time: 01/05/21  6:31 AM   Specimen: Nasopharyngeal Swab; Nasopharyngeal(NP) swabs in vial transport medium  Result Value Ref Range Status   SARS Coronavirus 2 by RT PCR NEGATIVE NEGATIVE Final    Comment: (NOTE) SARS-CoV-2 target nucleic acids are NOT DETECTED.  The SARS-CoV-2 RNA is generally detectable in upper respiratory specimens during the acute phase of infection. The lowest concentration of SARS-CoV-2 viral copies this assay can detect is 138 copies/mL. A negative result does not preclude SARS-Cov-2 infection and should not be used as the sole basis for treatment or other patient management decisions. A negative result may occur with  improper specimen collection/handling, submission of specimen other than nasopharyngeal swab, presence of viral mutation(s) within the areas targeted by this assay, and inadequate number of viral copies(<138 copies/mL). A negative result must be combined with clinical observations, patient history, and epidemiological information. The expected result is Negative.  Fact Sheet for Patients:  EntrepreneurPulse.com.au  Fact Sheet for Healthcare Providers:  IncredibleEmployment.be  This test is no t yet approved or cleared by the [place] FDA and  has been authorized for detection and/or diagnosis of SARS-CoV-2 by FDA under an Emergency Use Authorization (EUA). This EUA will remain  in effect (meaning this test can be used) for  the duration of the COVID-19 declaration under Section 564(b)(1) of the Act, 21 U.S.C.section 360bbb-3(b)(1), unless the authorization is terminated  or revoked sooner.       Influenza A by PCR NEGATIVE NEGATIVE Final   Influenza B by PCR NEGATIVE NEGATIVE Final    Comment: (NOTE) The Xpert Xpress SARS-CoV-2/FLU/RSV plus assay is intended as an aid in the diagnosis of influenza from Nasopharyngeal swab specimens and should not be used as a sole basis for treatment. Nasal washings and aspirates are unacceptable for Xpert Xpress SARS-CoV-2/FLU/RSV testing.  Fact Sheet for Patients: EntrepreneurPulse.com.au  Fact Sheet for Healthcare Providers: IncredibleEmployment.be  This test is not yet approved or cleared by the [place] FDA and has been authorized for detection and/or diagnosis of SARS-CoV-2 by FDA under an Emergency Use Authorization (EUA). This EUA will remain in effect (meaning this test  can be used) for the duration of the COVID-19 declaration under Section 564(b)(1) of the Act, 21 U.S.C. section 360bbb-3(b)(1), unless the authorization is terminated or revoked.  Performed at [facility], [place] [address], [place], [place] [address]   Culture, blood (Routine X 2) w Reflex to ID Panel     Status: None   Collection Time: 01/07/21 11:24 AM   Specimen: BLOOD RIGHT FOREARM  Result Value Ref Range Status   Specimen Description BLOOD RIGHT FOREARM  Final   Special Requests   Final    BOTTLES DRAWN AEROBIC AND ANAEROBIC Blood Culture results may not be optimal due to an inadequate volume of blood received in culture bottles   Culture   Final    NO GROWTH 5 DAYS Performed at [facility], [place] [address] [place], [place] [address]    Report Status 01/12/2021 FINAL  Final  Culture, blood (Routine X 2) w Reflex to ID Panel     Status: None   Collection Time: 01/07/21 11:29 AM   Specimen: BLOOD RIGHT HAND  Result Value Ref  Range Status   Specimen Description BLOOD RIGHT HAND  Final   Special Requests   Final    BOTTLES DRAWN AEROBIC AND ANAEROBIC Blood Culture results may not be optimal due to an inadequate volume of blood received in culture bottles   Culture   Final    NO GROWTH 5 DAYS Performed at [facility], [place] [address], [place], [place] [address]    Report Status 01/12/2021 FINAL  Final     Time coordinating discharge: Over 30 minutes  SIGNED:   [name], MD  [facility] 01/13/2021, 9:05 AM Pager   If 7PM-7AM, please contact night-coverage [email or web address] Password [email or web address]

## 2021-01-13 NOTE — NC FL2 (Signed)
[place] LEVEL OF CARE SCREENING TOOL     IDENTIFICATION  Patient Name: Donald Harrington Birthdate: 1956-06-07 Sex: male Admission Date (Current Location): 01/03/2021  [facility] and [place] Number:  [employer] and Address:  [facility] [place], [place] [address], [place], [place] [address]      Provider Number: [id number]  Attending Physician Name and Address:  [name], MD  Relative Name and Phone Number:       Current Level of Care: Hospital Recommended Level of Care: [place] Prior Approval Number:    Date Approved/Denied:   PASRR Number: [id number]  Discharge Plan: SNF    Current Diagnoses: Patient Active Problem List   Diagnosis Date Noted  . Dental caries   . Chronic apical periodontitis   . Chronic periodontal disease   . Bacteremia due to Streptococcus   . Duodenal ulcer   . Acute gastric erosion   . Acute embolic stroke ([place]) 67/61/9509  . GI bleed 01/03/2021  . Acute blood loss anemia 01/03/2021  . Cough 01/03/2021  . PAF (paroxysmal atrial fibrillation) ([place]) 01/03/2021  . History of aortic valve replacement 01/03/2021  . History of mitral valve replacement 01/03/2021    Orientation RESPIRATION BLADDER Height & Weight     Self,Time,Situation,Place  Normal Continent Weight: 77.7 kg Height:  5' 10.5" (179.1 cm)  BEHAVIORAL SYMPTOMS/MOOD NEUROLOGICAL BOWEL NUTRITION STATUS      Continent Diet  AMBULATORY STATUS COMMUNICATION OF NEEDS Skin   Limited Assist Verbally Normal (scattered bruising)                       Personal Care Assistance Level of Assistance  Bathing,Feeding,Dressing Bathing Assistance: Limited assistance Feeding assistance: Independent Dressing Assistance: Limited assistance     Functional Limitations Info  Sight,Hearing,Speech Sight Info: Adequate Hearing Info: Adequate Speech Info: Adequate    SPECIAL CARE FACTORS FREQUENCY  PT (By licensed PT),OT (By  licensed OT)     PT Frequency: 5x/wk OT Frequency: 5x/wk            Contractures Contractures Info: Not present    Additional Factors Info  Code Status,Allergies,Psychotropic Code Status Info: Full Allergies Info: NKA Psychotropic Info: Melatonin 5 mg at bedtime         Current Medications (01/13/2021):  This is the current hospital active medication list Current Facility-Administered Medications  Medication Dose Route Frequency Provider Last Rate Last Admin  . 0.9 %  sodium chloride infusion (Manually program via Guardrails IV Fluids)   Intravenous Once Nahser, Wonda Cheng, MD   Stopped at 01/05/21 2143  . 0.9 %  sodium chloride infusion   Intravenous Continuous Nahser, Wonda Cheng, MD 10 mL/hr at 01/08/21 1039 Restarted at 01/08/21 1056  . acetaminophen (TYLENOL) tablet 650 mg  650 mg Oral Q6H PRN Nahser, Wonda Cheng, MD   650 mg at 01/13/21 1142   Or  . acetaminophen (TYLENOL) suppository 650 mg  650 mg Rectal Q6H PRN Nahser, Wonda Cheng, MD      . apixaban Arne Cleveland) tablet 5 mg  5 mg Oral BID Rolla Flatten, RPH   5 mg at 01/13/21 1142  . Chlorhexidine Gluconate Cloth 2 % PADS 6 each  6 each Topical Daily [name], MD   6 each at 01/13/21 1150  . gentamicin (GARAMYCIN) 200 mg in dextrose 5 % 50 mL IVPB  200 mg Intravenous Q24H [place] Callas, NP 110 mL/hr at 01/12/21 2146 200  mg at 01/12/21 2146  . levothyroxine (SYNTHROID) tablet 25 mcg  25 mcg Oral q AM [name], MD   25 mcg at 01/13/21 [id number]  . melatonin tablet 5 mg  5 mg Oral QHS Nahser, Wonda Cheng, MD   5 mg at 01/12/21 2140  . metoprolol succinate (TOPROL-XL) 24 hr tablet 50 mg  50 mg Oral Daily Opyd, Ilene Qua, MD   50 mg at 01/13/21 1142  . Muscle Rub CREA   Topical PRN Nahser, Wonda Cheng, MD   Given at 01/11/21 [phone]  . ondansetron (ZOFRAN) tablet 4 mg  4 mg Oral Q6H PRN Nahser, Wonda Cheng, MD       Or  . ondansetron [facility]) injection 4 mg  4 mg Intravenous Q6H PRN Nahser, Wonda Cheng, MD      .  pantoprazole (PROTONIX) EC tablet 40 mg  40 mg Oral BID [name], MD   40 mg at 01/13/21 1142  . penicillin G potassium 12 Million Units in dextrose 5 % 500 mL continuous infusion  12 Million Units Intravenous Q12H  Callas, NP 41.7 mL/hr at 01/13/21 0521 12 Million Units at 01/13/21 0521  . potassium chloride (KLOR-CON) CR tablet 10 mEq  10 mEq Oral Daily [name], MD   10 mEq at 01/13/21 1142  . sodium chloride flush (NS) 0.9 % injection 10-40 mL  10-40 mL Intracatheter PRN [name], MD      . traMADol Veatrice Bourbon) tablet 50 mg  50 mg Oral Q12H PRN Nahser, Wonda Cheng, MD   50 mg at 01/13/21 0236     Discharge Medications: Please see discharge summary for a list of discharge medications.  Relevant Imaging Results:  Relevant Lab Results:   Additional Information SS#: [id number]-----VX gentamycin 200 mg Daily and Pcn G 12 million units BID  Pollie Friar, RN

## 2021-01-13 NOTE — Care Management Important Message (Signed)
Important Message  Patient Details  Name: Donald Harrington MRN: [id number] Date of Birth: 1956/05/30   Medicare Important Message Given:  Yes     Tonyetta Berko Montine Circle 01/13/2021, 4:52 PM

## 2021-01-13 NOTE — Progress Notes (Signed)
[facility] for Infectious Disease  Date of Admission:  01/03/2021      Total days of antibiotics 6  Penicillin G 3/22 >> current   Gentamicin 3/21 >> current            ASSESSMENT: Donald Harrington is a 65 y.o. male with GI bleed/anemia, headache with new acute bilateral embolic strokes, neck pain and bacteremia due to streptococcus gordonii. 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 This was negative for vegetations, negative for valve dysfunction. It is possible that the CVAs are due more to poor adherence to anticoagulation, however given strep in the blood hard to prove these do not represent septic emboli. 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 No need for CT surgery to see as there is no surgical indications at this point on TEE.   Dentistry to see for extractions --> deemed an outpatient urgency for him.   Looks like PT recommending CIR - not clear he will proceed due to social dilemmas and concern over losing his apartment. If he discharges to home and does not elect to go to CIR for a while would prefer to stop gentamicin due to concern over higher risk medication. If he stays for CIR, would continue gent through 14 days 4/4  I have arranged a follow up televisit next week with me to touch base.     PLAN: 1. Continue IV penicillin + gentamicin through 4/4 to complete 2 weeks 2. Then IV penicillin infusion alone for another 4 weeks through 5/2.  3. Needs weekly CBC, BMP, Norva Karvonen peaks/troughs per pharmacy.  4. Would like to see him go to CIR if he is agreeable.  5. OP follow up arranged for next week via telephone if he d/c's  6. Will follow for D/C plan    Principal Problem:   Bacteremia due to Streptococcus Active Problems:   Acute embolic stroke (HCC)   GI bleed   Acute blood loss anemia   Cough   PAF (paroxysmal atrial fibrillation) (HCC)   History of aortic valve  replacement   History of mitral valve replacement   Duodenal ulcer   Acute gastric erosion   Dental caries   Chronic apical periodontitis   Chronic periodontal disease   . sodium chloride   Intravenous Once  . apixaban  5 mg Oral BID  . Chlorhexidine Gluconate Cloth  6 each Topical Daily  . levothyroxine  25 mcg Oral q AM  . melatonin  5 mg Oral QHS  . metoprolol succinate  50 mg Oral Daily  . pantoprazole  40 mg Oral BID  . potassium chloride  10 mEq Oral Daily    SUBJECTIVE: Concerned he may lose his car and apartment. Needs a Pensions consultant.     Review of Systems: Review of Systems  Constitutional: Negative for chills, fever, malaise/fatigue and weight loss.  Eyes: Negative for blurred vision, photophobia and pain.  Respiratory: Negative for cough and sputum production.   Cardiovascular: Negative for chest pain and leg swelling.  Gastrointestinal: Negative for abdominal pain, diarrhea and vomiting.  Genitourinary: Negative for dysuria and flank pain.  Musculoskeletal: Positive for neck pain. Negative for joint pain and myalgias.  Skin: Negative for rash.  Neurological: Negative for dizziness, tingling, focal weakness and headaches.  Psychiatric/Behavioral: Negative for depression and substance abuse. The patient is not nervous/anxious and does not have insomnia.     No Known  Allergies   OBJECTIVE: Vitals:   01/12/21 2344 01/13/21 0351 01/13/21 0500 01/13/21 0828  BP: 98/65 101/71  100/64  Pulse: 87 96  84  Resp: 19 19  16   Temp: 98.2 F (36.8 C) 98.6 F (37 C)  98.7 F (37.1 C)  TempSrc: Oral Oral  Oral  SpO2: 94% 95%  94%  Weight:   77.7 kg   Height:       Body mass index is 24.23 kg/m.  Physical Exam Vitals reviewed.  Constitutional:      Appearance: Normal appearance. He is not ill-appearing.  HENT:     Mouth/Throat:     Comments: Poor dentition Eyes:     General: No scleral icterus.    Pupils: Pupils are equal, round, and reactive to light.   Neck:     Comments: Limited lateral rotation of neck. Tenderness +  Cardiovascular:     Rate and Rhythm: Normal rate and regular rhythm.     Heart sounds: No murmur heard.   Pulmonary:     Effort: Pulmonary effort is normal.     Breath sounds: Normal breath sounds.  Abdominal:     General: Bowel sounds are normal.     Palpations: Abdomen is soft.  Musculoskeletal:        General: Normal range of motion.  Skin:    General: Skin is warm and dry.     Capillary Refill: Capillary refill takes less than 2 seconds.     Comments: ecchymosis along arms.   Neurological:     Mental Status: He is alert and oriented to person, place, and time.     Lab Results Lab Results  Component Value Date   WBC 4.5 01/12/2021   HGB 9.4 (L) 01/12/2021   HCT 28.3 (L) 01/12/2021   MCV 101.4 (H) 01/12/2021   PLT 172 01/12/2021    Lab Results  Component Value Date   CREATININE 1.14 01/12/2021   BUN 9 01/12/2021   NA 136 01/12/2021   K 4.2 01/12/2021   CL 102 01/12/2021   CO2 28 01/12/2021    Lab Results  Component Value Date   ALT 9 01/08/2021   AST 12 (L) 01/08/2021   ALKPHOS 38 01/08/2021   BILITOT 0.6 01/08/2021     Microbiology: BCx 3/19 >> Strep gordonii 3/4 bottles (PCN MIC < 0.06) BCx 3/22 >. Neg, prelim     Janene Madeira, MSN, NP-C [facility] for Infectious Disease [email or web address] Pager: [phone] Office: [phone] [place]: [phone]

## 2021-01-13 NOTE — Progress Notes (Signed)
Inpatient Rehab Admissions Coordinator:   Notified by insurance that request for CIR Josem Kaufmann has been denied, stating pt is functioning at too high a level and does not require the intensity of IR.  I let TOC team know.  Will sign off at this time.   Shann Medal, PT, DPT Admissions Coordinator [phone] 01/13/21  12:10 PM

## 2021-01-13 NOTE — Progress Notes (Signed)
PHARMACY CONSULT NOTE FOR:  OUTPATIENT  PARENTERAL ANTIBIOTIC THERAPY (OPAT)  Indication: PV endocarditis  Regimen: Penicillin 24 million units daily as a continuous infusion  End date: 02/17/2021  IV antibiotic discharge orders are pended. To discharging provider:  please sign these orders via discharge navigator,  Select New Orders & click on the button choice - Manage This Unsigned Work.     Thank you for allowing pharmacy to be a part of this patient's care.  Phillis Haggis 01/13/2021, 10:46 AM

## 2021-01-13 NOTE — TOC Initial Note (Addendum)
Transition of Care [facility]) - Initial/Assessment Note    Patient Details  Name: Donald Harrington MRN: [id number] Date of Birth: 04-19-1956  Transition of [facility]) CM/SW Contact:    Pollie Friar, RN Phone Number: 01/13/2021, 1:16 PM  Clinical Narrative:                 Recommendations are for CIR but [place] medicare has denied a CIR stay. CM met with the patient and he is agreeable to going to [facility]. CM has completed FL2 and sent information to [facility] and asked them to review the referral. CM has assisted the patient in paying some of his bills that are due today and having his apartment rent postponed until the 15 th without penalty. TOC following.  1435: [facility] is not in network with his insurance. CM will work on finding a SNF that is in network.   Expected Discharge Plan: Skilled Nursing Facility Barriers to Discharge: Continued Medical Work up   Patient Goals and CMS Choice   CMS Medicare.gov Compare Post Acute Care list provided to:: Patient Choice offered to / list presented to : Patient  Expected Discharge Plan and Services Expected Discharge Plan: [place] In-house Referral: Clinical Social Work Discharge Planning Services: CM Consult Post Acute Care Choice: [place] Living arrangements for the past 2 months: Apartment Expected Discharge Date: 01/13/21                                    Prior Living Arrangements/Services Living arrangements for the past 2 months: Apartment Lives with:: Self Patient language and need for interpreter reviewed:: Yes Do you feel safe going back to the place where you live?: Yes      Need for Family Participation in Patient Care: Yes (Comment) Care giver support system in place?: No (comment)   Criminal Activity/Legal Involvement Pertinent to Current Situation/Hospitalization: No - Comment as needed  Activities of Daily Living      Permission  Sought/Granted                  Emotional Assessment Appearance:: Appears stated age Attitude/Demeanor/Rapport: Engaged Affect (typically observed): Anxious,Accepting Orientation: : Oriented to Self,Oriented to Place,Oriented to  Time,Oriented to Situation   Psych Involvement: No (comment)  Admission diagnosis:  Acute embolic stroke [facility]) [O70.7] Patient Active Problem List   Diagnosis Date Noted  . Dental caries   . Chronic apical periodontitis   . Chronic periodontal disease   . Bacteremia due to Streptococcus   . Duodenal ulcer   . Acute gastric erosion   . Acute embolic stroke ([place]) 86/75/4492  . GI bleed 01/03/2021  . Acute blood loss anemia 01/03/2021  . Cough 01/03/2021  . PAF (paroxysmal atrial fibrillation) ([place]) 01/03/2021  . History of aortic valve replacement 01/03/2021  . History of mitral valve replacement 01/03/2021   PCP:  Patient, No Pcp Per Pharmacy:   [facility] [address], [place] [address] Phone: [phone] Fax: [phone]     Social Determinants of Health (SDOH) Interventions    Readmission Risk Interventions No flowsheet data found.

## 2021-01-13 NOTE — Progress Notes (Signed)
Pharmacy Antibiotic Note  Donald Harrington is a 65 y.o. male admitted on 01/03/2021 with strokes  Pharmacy has been consulted for gentamicin dosing.  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 Will monitor trough levels, goal <1  Last level <0.5 on 3/25, therapeutic. Scr trending up, will need to monitor (0.93>>1.14)   Plan: Cont Gentamicin 200mg  IV q24h Penicillin 12 million units q12h Continue to monitor Scr Re-check gent trough as needed  Height: 5' 10.5" (179.1 cm) Weight: 77.7 kg (171 lb 4.8 oz) IBW/kg (Calculated) : 74.15  Temp (24hrs), Avg:98.5 F (36.9 C), Min:98.2 F (36.8 C), Max:98.8 F (37.1 C)  Recent Labs  Lab 01/07/21 0323 01/08/21 0640 01/09/21 2206 01/10/21 0325 01/11/21 0321 01/12/21 0213  WBC 3.7* 4.0  --  4.9 4.6 4.5  CREATININE 1.01 0.93  --  1.11  --  1.14  GENTTROUGH  --   --  <0.5*  --   --   --     Estimated Creatinine Clearance: 66.9 mL/min (by C-G formula based on SCr of 1.14 mg/dL).    No Known Allergies  Antimicrobials this admission: Ceftriaxone 3/20 >>3/22 Penicillin 3/22>> Gentamicin 3/21>>  Microbiology results: 3/19 BCx: strep gordonii  [employer] A. Levada Dy, PharmD, BCPS, FNKF Clinical Pharmacist [place] Please utilize Amion for appropriate phone number to reach the unit pharmacist ([place])

## 2021-01-13 NOTE — Progress Notes (Signed)
Physical Therapy Treatment Patient Details Name: Donald Harrington MRN: [id number] DOB: 1955/12/28 Today's Date: 01/13/2021    History of Present Illness 65 y.o. male presenting from outpatient MRI showing multifocal acute embolic strokes presents (subcentimeter bilateral cerebral and cerebellar infarcts). Also presenting with acute anemia and dark stools concerning for a GI bleed. Chest x-ray impression of COPD and bibasilar atelectasis or infiltrates, new since prior study. 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    PT Comments    Pt progressing slowly with mobility, ambulating good hallway distance with use of SPC and close guard with brief periods of light assist to steady/guide trajectory. Pt continues to lack insight into deficits, at one point tells PT to stop guarding him and "just go home", PT explained his current high fall risk and need for close guard. Pt's mild LE incoordination evident during gait. Pt disheartened about change in d/c plan, but agreeable to SNF recommendation once explained by both case management and PT.    Follow Up Recommendations  SNF     Equipment Recommendations  None recommended by PT    Recommendations for Other Services       Precautions / Restrictions Precautions Precautions: Fall Precaution Comments: HOH; HR < 130 (return to bed if >130) Restrictions Weight Bearing Restrictions: No    Mobility  Bed Mobility               General bed mobility comments: up in chair upon PT arrival to room    Transfers Overall transfer level: Needs assistance Equipment used: Straight cane Transfers: Sit to/from Stand Sit to Stand: Min guard         General transfer comment: for safety, cues for rise and self-steady. Pt requesting increased space from PT during initial stand,  PT explaining need for guarding. STS x2, from recliner and from other room chair  Ambulation/Gait Ambulation/Gait assistance: Min guard;Min assist Gait Distance (Feet): 270 Feet Assistive device: Straight cane Gait Pattern/deviations: Step-through pattern;Decreased stride length;Drifts right/left;Wide base of support Gait velocity: decr   General Gait Details: Min guard for safety, occasional min assist to steady or provide hallway navigational cues. Pt demonstrating mild gait incoordination, no overt LOB this day. Pt unable to maintain straight trajectory during gait.   Stairs             Wheelchair Mobility    Modified Rankin (Stroke Patients Only) Modified Rankin (Stroke Patients Only) Pre-Morbid Rankin Score: No symptoms Modified Rankin: Moderately severe disability     Balance Overall balance assessment: Needs assistance Sitting-balance support: No upper extremity supported;Feet supported Sitting balance-Leahy Scale: Good     Standing balance support: Bilateral upper extremity supported Standing balance-Leahy Scale: Poor Standing balance comment: Reliant on at least unilateral UE support with mobility                            Cognition Arousal/Alertness: Awake/alert Behavior During Therapy: WFL for tasks assessed/performed Overall Cognitive Status: Impaired/Different from baseline Area of Impairment: Memory;Problem solving;Safety/judgement;Attention;Following commands                   Current Attention Level: Sustained Memory: Decreased recall of precautions;Decreased short-term memory Following Commands: Follows one step commands with increased time Safety/Judgement: Decreased awareness of safety   Problem Solving: Slow processing;Requires verbal cues  General Comments: Pt frustrated this day about change in d/c plan, at one point becomes frustrated with PT stating "I'm going to need for you to move, maybe go home". This is pt's only time  demonstrating irritability with PT. Pt has difficulty multitasking, evident during gait as pt would stop every time he wanted to talk to PT. Pt requires increased time to follow commands, at times does not follow them but suspect this is due to personality. Difficult for pt to maintain focus on task at hand.      Exercises      General Comments        Pertinent Vitals/Pain Pain Assessment: Faces Faces Pain Scale: Hurts a little bit Pain Location: neck Pain Descriptors / Indicators: Grimacing;Discomfort;Guarding Pain Intervention(s): Limited activity within patient's tolerance;Monitored during session;Repositioned    Home Living                      Prior Function            PT Goals (current goals can now be found in the care plan section) Acute Rehab PT Goals Patient Stated Goal[facility] Goal Formulation: With patient Time For Goal Achievement: 01/19/21 Potential to Achieve Goals: Good Progress towards PT goals: Progressing toward goals    Frequency    Min 3X/week      PT Plan Discharge plan needs to be updated    Co-evaluation              AM-PAC PT "6 Clicks" Mobility   Outcome Measure  Help needed turning from your back to your side while in a flat bed without using bedrails?: A Little Help needed moving from lying on your back to sitting on the side of a flat bed without using bedrails?: A Little Help needed moving to and from a bed to a chair (including a wheelchair)?: A Little Help needed standing up from a chair using your arms (e.g., wheelchair or bedside chair)?: A Little Help needed to walk in hospital room?: A Little Help needed climbing 3-5 steps with a railing? : A Lot 6 Click Score: 17    End of Session Equipment Utilized During Treatment: Gait belt Activity Tolerance: Patient tolerated treatment well Patient left: in bed;with call bell/phone within reach;with bed alarm set Nurse Communication: Mobility status PT  Visit Diagnosis: Unsteadiness on feet (R26.81);Other abnormalities of gait and mobility (R26.89);Muscle weakness (generalized) (M62.81);Difficulty in walking, not elsewhere classified (R26.2);Other symptoms and signs involving the nervous system (R29.898)     Time: 1610-9604 PT Time Calculation (min) (ACUTE ONLY): 33 min  Charges:  $Gait Training: 8-22 mins $Therapeutic Activity: 8-22 mins                     Stacie Glaze, PT Acute Rehabilitation Services Pager [phone]  Office [phone]    [place] 01/13/2021, 3:21 PM

## 2021-01-14 MED ORDER — POLYETHYLENE GLYCOL 3350 17 G PO PACK
17.0000 g | PACK | Freq: Every day | ORAL | Status: DC | PRN
  Administered 2021-01-14: 17 g via ORAL
  Filled 2021-01-14: qty 1

## 2021-01-14 MED ORDER — SENNA 8.6 MG PO TABS: 1.0000 | ORAL_TABLET | Freq: Every evening | ORAL | Status: DC | PRN

## 2021-01-14 MED ORDER — PANTOPRAZOLE SODIUM 40 MG PO TBEC: 40.0000 mg | DELAYED_RELEASE_TABLET | Freq: Two times a day (BID) | ORAL | 3 refills | Status: AC

## 2021-01-14 MED ORDER — PENICILLIN G POTASSIUM IV (FOR PTA / DISCHARGE USE ONLY): 24.0000 10*6.[IU] | Freq: Every day | INTRAVENOUS | 0 refills | Status: DC

## 2021-01-14 NOTE — Plan of Care (Signed)
  Problem: Education: Goal: Knowledge of secondary prevention will improve Outcome: Progressing Goal: Knowledge of patient specific risk factors addressed and post discharge goals established will improve Outcome: Progressing   Problem: Education: Goal: Knowledge of General Education information will improve Description: Including pain rating scale, medication(s)/side effects and non-pharmacologic comfort measures Outcome: Progressing   Problem: Health Behavior/Discharge Planning: Goal: Ability to manage health-related needs will improve Outcome: Progressing   Problem: Clinical Measurements: Goal: Ability to maintain clinical measurements within normal limits will improve Outcome: Progressing Goal: Will remain free from infection Outcome: Progressing Goal: Diagnostic test results will improve Outcome: Progressing Goal: Respiratory complications will improve Outcome: Progressing Goal: Cardiovascular complication will be avoided Outcome: Progressing   Problem: Activity: Goal: Risk for activity intolerance will decrease Outcome: Progressing   Problem: Nutrition: Goal: Adequate nutrition will be maintained Outcome: Progressing   Problem: Coping: Goal: Level of anxiety will decrease Outcome: Progressing   Problem: Elimination: Goal: Will not experience complications related to bowel motility Outcome: Progressing Goal: Will not experience complications related to urinary retention Outcome: Progressing   Problem: Pain Managment: Goal: General experience of comfort will improve Outcome: Progressing   Problem: Safety: Goal: Ability to remain free from injury will improve Outcome: Progressing   Problem: Skin Integrity: Goal: Risk for impaired skin integrity will decrease Outcome: Progressing

## 2021-01-14 NOTE — Progress Notes (Signed)
Occupational Therapy Treatment Patient Details Name: Donald Harrington MRN: [id number] DOB: May 06, 1956 Today's Date: 01/14/2021    History of present illness 65 y.o. male presenting from outpatient MRI showing multifocal acute embolic strokes presents (subcentimeter bilateral cerebral and cerebellar infarcts). Also presenting with acute anemia and dark stools concerning for a GI bleed. Chest x-ray impression of COPD and bibasilar atelectasis or infiltrates, new since prior study. 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   OT comments  Pt progressing towards established OT goals. Continues to presenting with poor balance and cognition impacting his safe performance of ADLs. Pt performing functional mobility in hallway with Min Guard-Min A and SPC; one episode of LOB with Min A for correction. Pt performing LB dressing, toileting, and grooming at sink with Min guard A. Pt denied CIR rehab and will need post-acute rehab at SNF level. Will continue to follow acutely as admitted.    Follow Up Recommendations  SNF    Equipment Recommendations  Tub/shower seat    Recommendations for Other Services PT consult;Speech consult    Precautions / Restrictions Precautions Precautions: Fall       Mobility Bed Mobility Overal bed mobility: Modified Independent                  Transfers Overall transfer level: Needs assistance Equipment used: Straight cane Transfers: Sit to/from Stand Sit to Stand: Min guard         General transfer comment: MIn Guard A for safety    Balance Overall balance assessment: Needs assistance Sitting-balance support: No upper extremity supported;Feet supported Sitting balance-Leahy Scale: Good     Standing balance support: Single extremity supported;During functional  activity Standing balance-Leahy Scale: Poor                             ADL either performed or assessed with clinical judgement   ADL Overall ADL's : Needs assistance/impaired     Grooming: Supervision/safety;Min guard;Wash/dry face               Lower Body Dressing: Supervision/safety;Bed level Lower Body Dressing Details (indicate cue type and reason): long sitting to don socks     Toileting- Clothing Manipulation and Hygiene: Min guard;Sit to/from stand Toileting - Clothing Manipulation Details (indicate cue type and reason): urinating while standing at toilet     Functional mobility during ADLs: Min guard;Cane;Minimal assistance General ADL Comments: Pt continues to present with decreased balance and cognition. Pt with one LOB during mobility (posteriorly) and requiring Min A for correction     Vision       Perception     Praxis      Cognition Arousal/Alertness: Awake/alert Behavior During Therapy: WFL for tasks assessed/performed Overall Cognitive Status: Impaired/Different from baseline Area of Impairment: Memory;Problem solving;Safety/judgement;Attention;Following commands                   Current Attention Level: Sustained Memory: Decreased recall of precautions;Decreased short-term memory Following Commands: Follows one step commands with increased time Safety/Judgement: Decreased awareness of safety   Problem Solving: Slow processing;Requires verbal cues General Comments: Upon arrival, pt discussing dc plan with CM. Pt slightly frustrated and stating "just tell me which hell you are sending me to." Pt continues to present with tangiental behavior in conversation. Perseverating on wanting  to die so he doesnt have to be here anymore. Requiring cues for returning to task        Exercises     Shoulder Instructions       General Comments      Pertinent Vitals/ Pain       Pain Assessment: Faces Faces Pain Scale: Hurts a little  bit Pain Location: neck Pain Descriptors / Indicators: Grimacing;Discomfort;Guarding Pain Intervention(s): Monitored during session;Limited activity within patient's tolerance;Repositioned  Home Living                                          Prior Functioning/Environment              Frequency  Min 2X/week        Progress Toward Goals  OT Goals(current goals can now be found in the care plan section)  Progress towards OT goals: Progressing toward goals  Acute Rehab OT Goals Patient Stated Goal: "be done with all this" OT Goal Formulation: With patient Time For Goal Achievement: 01/20/21 Potential to Achieve Goals: Good ADL Goals Pt Will Perform Grooming: with modified independence;standing Pt Will Perform Lower Body Dressing: with modified independence;sit to/from stand;sitting/lateral leans Pt Will Transfer to Toilet: with modified independence;ambulating;regular height toilet Pt Will Perform Tub/Shower Transfer: with supervision;Shower transfer;shower seat;ambulating Pt/caregiver will Perform Home Exercise Program: Increased strength;Left upper extremity;With Supervision;With written HEP provided Additional ADL Goal #1: Pt will perform simple IADL with 2-3 cues  Plan Discharge plan needs to be updated;Frequency needs to be updated    Co-evaluation                 AM-PAC OT "6 Clicks" Daily Activity     Outcome Measure   Help from another person eating meals?: None Help from another person taking care of personal grooming?: A Little Help from another person toileting, which includes using toliet, bedpan, or urinal?: A Little Help from another person bathing (including washing, rinsing, drying)?: A Little Help from another person to put on and taking off regular upper body clothing?: A Little Help from another person to put on and taking off regular lower body clothing?: A Little 6 Click Score: 19    End of Session Equipment Utilized  During Treatment: Other (comment) (SPC)  OT Visit Diagnosis: Unsteadiness on feet (R26.81);Other abnormalities of gait and mobility (R26.89);Muscle weakness (generalized) (M62.81);Hemiplegia and hemiparesis;Pain Hemiplegia - Right/Left: Left Hemiplegia - dominant/non-dominant: Non-Dominant Hemiplegia - caused by: Cerebral infarction Pain - part of body:  (Back)   Activity Tolerance Patient tolerated treatment well   Patient Left in bed;with call bell/phone within reach;with bed alarm set   Nurse Communication Mobility status        Time: 9485-4627 OT Time Calculation (min): 34 min  Charges: OT General Charges $OT Visit: 1 Visit OT Treatments $Self Care/Home Management : 23-37 mins  [place], OTR/L Acute Rehab Pager: [phone] Office: [place] 01/14/2021, 4:16 PM

## 2021-01-14 NOTE — Discharge Summary (Signed)
Physician Discharge Summary  Donald Harrington NIO:[id number] DOB: 10-Jun-1956 DOA: 01/03/2021  PCP: Patient, No Pcp Per  Admit date: 01/03/2021 Discharge date: 01/14/2021  Admitted From: home Disposition: Skilled nursing facility  Recommendations for Outpatient Follow-up:  1. Follow up with PCP in 1-2 weeks 2. Please obtain BMP/CBC in one week 3. He will need to continue IV penicillin and gentamicin for 2 weeks then 4 additional weeks of penicillin for a total of 6 weeks of antibiotic coverage.   Home Health:no Equipment/Devices:none  Discharge Condition:Stable CODE STATUS:Full Diet recommendation: Heart Healthy   Brief/Interim Summary: 65 y.o. 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  Reports no abdominal pain nausea or vomiting black stools or red stools.  He is found to be tachycardic blood cultures were sent which were positive for Streptococcus ID was consulted  Significant events: EGD on 01/06/2019 due to shortness of gastritis, duodenal ulcer but no evidence of bleeding. Status post 2 units packed red blood cells 01/08/2019 orthopantogram multiple dental caries with residual dentition lucency within the maxilla related to absent tooth 10 which may represent an residual periapical abscess. 01/06/2021 2D echocardiogram showed poor acoustic window EF of 55 MV bioprosthetic valve not well visualized with a mean gradient of 9 mmHg.  AV bioprosthetic valve not well visualized with a mean gradient of 30 mmHg  Antibiotics: 3.21.2022 rocephin 3.21.2022 Gentamacin  3.22.2022 PCN  Microbiology data: 01/04/2021 blood culture: Streptococcus Gondi 01/07/2021 blood cultures negative  till date.  Procedures: 01/08/2021 TEE no vegetation Discharge Diagnoses:  Principal Problem:   Bacteremia due to Streptococcus Active Problems:   Acute embolic stroke [facility])   GI bleed   Acute blood loss anemia   Cough   PAF (paroxysmal atrial fibrillation) (HCC)   History of aortic valve replacement   History of mitral valve replacement   Duodenal ulcer   Acute gastric erosion   Dental caries   Chronic apical periodontitis   Chronic periodontal disease   Acute blood loss anemia/upper GI bleed: Patient was taking Goody powder at home he status post 2 units of packed red blood cells, EGD on 01/05/2021 showed duodenal ulcer but no evidence of bleeding. He was placed on Protonix p.o. twice daily which she tolerated well had no signs of bleeding in house. Will need to follow-up with GI as an outpatient.  Acute multifocal embolism: Likely in the setting of subtherapeutic INR with a history of A. fib. He was transitioned to oral Xarelto which she will continue as an outpatient.  Sepsis due to Streptococcus bacteremia in the setting of periodontal disease: Sepsis physiology resolved. He was started on IV gentamicin and penicillins which she will continue for 2 weeks.  Infectious disease was consulted who agreed with plan and recommended 2 weeks of penicillin and gentamicin and an additional 4 weeks of penicillin on for total of 6 weeks. Orthopantogram was done that showed possible abscess dental was consulted recommended no surgical intervention as an inpatient will need further follow-up as an outpatient. He was done that showed no vegetation.  Paroxysmal atrial fibrillation: Rate control on metoprolol continue Eliquis.  Essential hypertension: No changes made to his medication.   Discharge Instructions  Discharge Instructions    [facility] pharmacist to adjust dose for Vancomycin, Aminoglycosides and other anti-infective therapies as requested by physician.    Complete by: As directed    [facility] to provide Cath Flo 57m   Complete by: As directed    Administer for PICC line occlusion and as ordered by physician for other access device issues.   Ambulatory referral to Neurology   Complete by: As directed    Follow up with stroke clinic NP (Jessica Vanschaick or CCecille Rubin if both not available, consider SZachery Dauer or [facility] Thanks.   Anaphylaxis Kit: Provided to treat any anaphylactic reaction to the medication being provided to the patient if First Dose or when requested by physician   Complete by: As directed    Epinephrine 130mml vial / amp: Administer 0.103m58m0.103ml4mubcutaneously once for moderate to severe anaphylaxis, nurse to call physician and pharmacy when reaction occurs and call 911 if needed for immediate care   Diphenhydramine 50mg41mIV vial: Administer 25-50mg 2mM PRN for first dose reaction, rash, itching, mild reaction, nurse to call physician and pharmacy when reaction occurs   Sodium Chloride 0.9% NS 500ml I30mdminister if needed for hypovolemic blood pressure drop or as ordered by physician after call to physician with anaphylactic reaction   Change dressing on IV access line weekly and PRN   Complete by: As directed    Diet - low sodium heart healthy   Complete by: As directed    Flush IV access with Sodium Chloride 0.9% and Heparin 10 units/ml or 100 units/ml   Complete by: As directed    Home infusion instructions - [facility]   Complete by: As directed    Instructions: Flush IV access with Sodium Chloride 0.9% and Heparin 10units/ml or 100units/ml   Change dressing on IV access line: Weekly and PRN   Instructions Cath Flo 2mg: Ad19mister for PICC Line occlusion and as ordered by physician for other access device   [facility] pharmacist to adjust dose for: Vancomycin, Aminoglycosides and other anti-infective therapies as requested by physician    Increase activity slowly   Complete by: As directed    Method of administration may be changed at the discretion of home infusion pharmacist based upon assessment of the patient and/or caregiver's ability to self-administer the medication ordered   Complete by: As directed      Allergies as of 01/14/2021   No Known Allergies     Medication List    STOP taking these medications   warfarin 5 MG tablet Commonly known as: COUMADIN     TAKE these medications   apixaban 5 MG Tabs tablet Commonly known as: ELIQUIS Take 1 tablet (5 mg total) by mouth 2 (two) times daily.   cyclobenzaprine 10 MG tablet Commonly known as: FLEXERIL Take 10 mg by mouth daily as needed.   Euthyrox 25 MCG tablet Generic drug: levothyroxine Take 25 mcg by mouth daily.   furosemide 20 MG tablet Commonly known as: LASIX Take 40 mg by mouth daily.   gentamicin 200 mg in dextrose 5 % 50 mL Inject 200 mg into the vein daily.   Goodys Extra Strength 500-325-[id number] Generic drug: Aspirin-Acetaminophen-Caffeine Take 1 packet by mouth daily as needed (pain).   lovastatin 20 MG tablet Commonly known as: MEVACOR Take 20 mg by mouth at bedtime.   melatonin 5 MG Tabs Take 1 tablet (5 mg total) by mouth at bedtime.   metoprolol succinate  50 MG 24 hr tablet Commonly known as: TOPROL-XL Take 50 mg by mouth daily.   pantoprazole 40 MG tablet Commonly known as: PROTONIX Take 1 tablet (40 mg total) by mouth 2 (two) times daily for 28 days.   penicillin G  IVPB Inject 24 Million Units into the vein daily. Indication:  PV endocarditis  First Dose: No Last Day of Therapy:  02/17/2021 Labs - Once weekly:  CBC/D and BMP, Labs - Every other week:  ESR and CRP Method of administration: Elastomeric (Continuous infusion) Method of administration may be changed at the discretion of home infusion pharmacist based upon assessment of the patient and/or caregiver's ability to self-administer the medication ordered.    penicillin G potassium 12 Million Units in dextrose 5 % 500 mL Inject 12 Million Units into the vein every 12 (twelve) hours.   potassium chloride 10 MEQ tablet Commonly known as: KLOR-CON Take 10 mEq by mouth daily.   thiamine 250 MG tablet Take 250 mg by mouth daily.            Discharge Care Instructions  (From admission, onward)         Start     Ordered   01/14/21 0000  Change dressing on IV access line weekly and PRN  (Home infusion instructions - [facility] )        01/14/21 1001          Follow-up Information    [facility]. Schedule an appointment as soon as possible for a visit in 4 week(s).   Specialty: Neurology Contact information: [address] [place] [phone]             No Known Allergies  Consultations:  Infectious disease  Gastroenterology   Procedures/Studies: DG Orthopantogram  Result Date: 01/07/2021 CLINICAL DATA:  Endocarditis EXAM: ORTHOPANTOGRAM/PANORAMIC COMPARISON:  None. FINDINGS: The mandible is intact. The maxilla is intact. There is numerous absent dentition involving the maxillary and mandibular molars and the majority of a maxillary and mandibular premolars.z dental caries are identified within tooth 8, the residual fragment of tooth 13, tooth 24, and tooth 25. A lucent lesion is seen within the maxilla within the defect related to the absent tooth 10 demonstrating relatively poorly circumscribed margins and no significant matrix which may represent a residual periapical abscess. IMPRESSION: Multiple dental caries within the residual dentition as described above. Lucency within the maxilla within the defect related to the absent tooth 10 which may represent a a residual periapical abscess. Electronically Signed   By: [name]   On: 01/07/2021 11:20   MR ANGIO HEAD WO CONTRAST  Result Date: 01/04/2021 CLINICAL DATA:  Multiple embolic strokes.  EXAM: MRA HEAD WITHOUT CONTRAST TECHNIQUE: Angiographic images of the Circle of Willis were obtained using MRA technique without intravenous contrast. COMPARISON:  MRI yesterday. FINDINGS: Both internal carotid arteries are patent through the skull base and siphon regions. The right ICA supplies the right middle cerebral artery territory and both anterior cerebral artery territories. No evidence of large vessel occlusion. No correctable proximal stenosis. Left internal carotid artery supplies the left middle cerebral artery territory and the left PCA. No large vessel occlusion. Both vertebral arteries are patent to the basilar. No basilar stenosis. Posterior circulation branch vessels show flow. Left PCA takes fetal origin from the anterior circulation, as noted above. IMPRESSION: No large or medium vessel occlusion or correctable proximal stenosis. Electronically Signed   By: Jan Fireman.D.  On: 01/04/2021 16:47   MR CERVICAL SPINE WO CONTRAST  Result Date: 01/10/2021 CLINICAL DATA:  Initial evaluation for acute neck pain, infection suspected. EXAM: MRI CERVICAL SPINE WITHOUT CONTRAST TECHNIQUE: Multiplanar, multisequence MR imaging of the cervical spine was performed. No intravenous contrast was administered. COMPARISON:  Prior MRI from 12/05/2020. FINDINGS: Alignment: Examination somewhat degraded by motion artifact. Straightening with slight reversal of the normal cervical lordosis. Trace anterolisthesis of C3 on C4, stable. Vertebrae: Vertebral body height maintained without acute or interval fracture. Bone marrow signal intensity diffusely heterogeneous without worrisome osseous lesion. Discogenic reactive endplate changes present about the C5-6 through C7-T1 interspaces, with partial ankylosis across the C6-7 disc space. Appearance is stable from previous. No signal changes to suggest osteomyelitis discitis or septic arthritis. Cord: Normal signal and morphology.  No epidural collections. Posterior  Fossa, vertebral arteries, paraspinal tissues: Visualized brain and posterior fossa within normal limits. Craniocervical junction normal. Paraspinous and prevertebral soft tissues within normal limits. Normal flow voids seen within the vertebral arteries bilaterally. Disc levels: C2-C3: Small central disc protrusion mildly indents the ventral thecal sac, slightly asymmetric to the left. No significant spinal stenosis or cord deformity. Superimposed mild left greater than right facet degeneration without significant foraminal stenosis. Appearance is stable. C3-C4: Trace anterolisthesis. Central disc protrusion indents the ventral thecal sac, contacting and mildly flattening the ventral spinal cord. No cord signal changes or significant spinal stenosis. Superimposed left greater than right uncovertebral and facet hypertrophy with resultant mild left C4 foraminal stenosis. Right neural foramina remains patent. Appearance is stable. C4-C5: Degenerative intervertebral disc space narrowing with diffuse disc osteophyte complex, eccentric to the left. Broad posterior component flattens and partially effaces the ventral thecal sac with resultant mild spinal stenosis. Mild cord flattening without cord signal changes. Superimposed bilateral facet hypertrophy with resultant moderate to severe bilateral C5 foraminal narrowing. Appearance is stable. C5-C6: Degenerative intervertebral disc space narrowing with diffuse disc osteophyte complex, asymmetric to the left. Broad posterior component flattens and partially faces the ventral thecal sac. Mild flattening of the ventral cord, greater on the left. No cord signal changes. Mild spinal stenosis is stable. Moderate bilateral C6 foraminal narrowing, worse on the right, also stable. C6-C7: Advanced degenerative intervertebral disc space narrowing with partial ankylosis across the C6-7 interspace. Right greater than left uncovertebral spurring. Posterior disc osteophyte flattens and  partially effaces the ventral thecal sac without significant stenosis or cord impingement. Superimposed mild facet hypertrophy. Mild bilateral C7 foraminal stenosis is relatively stable. C7-T1: Degenerative intervertebral disc space narrowing with diffuse disc osteophyte complex. Flattening and partial effacement of the ventral thecal sac without significant spinal stenosis or cord deformity. Superimposed bilateral facet hypertrophy. Moderate left with mild right C8 foraminal narrowing, stable. Visualized upper thoracic spine demonstrates no significant finding. IMPRESSION: 1. No MRI evidence for acute infection or other abnormality within the cervical spine. 2. Multilevel cervical spondylosis with resultant mild spinal stenosis at C4-5 and C5-6. 3. Multifactorial degenerative changes with resultant multilevel foraminal narrowing as above. Notable findings include moderate to severe bilateral C5 foraminal narrowing, with moderate bilateral C6 and left C8 foraminal stenosis. Electronically Signed   By: Jeannine Boga M.D.   On: 01/10/2021 23:15   DG CHEST PORT 1 VIEW  Result Date: 01/05/2021 CLINICAL DATA:  Shortness of breath EXAM: PORTABLE CHEST 1 VIEW COMPARISON:  01/03/2021 FINDINGS: Prior median sternotomy and valve replacement. Heart is normal size. Hyperinflation/COPD. Bibasilar opacities. No effusions. No acute bony abnormality. IMPRESSION: COPD. Bibasilar atelectasis or infiltrates, new since prior  study. Electronically Signed   By: Rolm Baptise M.D.   On: 01/05/2021 15:35   DG CHEST PORT 1 VIEW  Result Date: 01/03/2021 CLINICAL DATA:  Cough.  Acute blood loss. EXAM: PORTABLE CHEST 1 VIEW COMPARISON:  January 03, 2021 FINDINGS: Stable sternotomy wires and cardiomegaly. The hila and mediastinum are unremarkable. Skin fold over the upper lateral left chest. No pneumothorax. No nodules or masses. Mild interstitial prominence without overt edema. No focal infiltrate. IMPRESSION: Probable mild  pulmonary venous congestion. No other acute abnormalities. Electronically Signed   By: Dorise Bullion III M.D   On: 01/03/2021 21:21   ECHOCARDIOGRAM COMPLETE  Result Date: 01/06/2021    ECHOCARDIOGRAM REPORT   Patient Name:   Donald Harrington Date of Exam: 01/05/2021 Medical Rec #:  [id number]    Height:       70.5 in Accession #:    [id number]   Weight:       165.0 lb Date of Birth:  [birth year]    BSA:          1.934 m Patient Age:    65 years     BP:           109/68 mmHg Patient Gender: M            HR:           110 bpm. Exam Location:  Inpatient Procedure: 2D Echo Indications:    Stroke  History:        Patient has no prior history of Echocardiogram examinations.                 Aortic and mitral replacement in 2015 at [facility] due                 to endocarditis; Aortic Valve Disease, Mitral Valve Disease and                 Endocarditis.  Sonographer:    Merrie Roof RDCS Referring Phys: [place]  1. Poor acoustic windows. Cannot exclude vegetations. Would recomm TEE to further evaluate if clinically indicated.  2. Left ventricular ejection fraction, by estimation, is 55 to 60%. The left ventricle has normal function. The left ventricle has no regional wall motion abnormalities. Indeterminate diastolic filling due to E-A fusion.  3. Right ventricular systolic function is normal. The right ventricular size is normal.  4. Left atrial size was mild to moderately dilated.  5. MV prosthesis (Epic bioprosthesis, 05/01/14) Not well visualized. Peak and mean gradients through the valveare 19 and 9 mm Hx respectively. Compared to echo report from 06/05/19 [facility]) mild increase in mean gradient (6 to 9 mm). The mitral valve has been repaired/replaced. Trivial mitral valve regurgitation.  6. AV prosthesis (23 mm Hancock bioprosthesis, 05/01/14) present Not well visualized. Peak and mean gradients through the valve are 51 and 30 mm Hg respectively Comparee to reprot for 8/117/20 [facility]), mean  graidnet is increased. . The aortic valve has been repaired/replaced. Aortic valve regurgitation is trivial.  7. The inferior vena cava is normal in size with greater than 50% respiratory variability, suggesting right atrial pressure of 3 mmHg. FINDINGS  Left Ventricle: Left ventricular ejection fraction, by estimation, is 55 to 60%. The left ventricle has normal function. The left ventricle has no regional wall motion abnormalities. The left ventricular internal cavity size was normal in size. There is  no left ventricular hypertrophy. Indeterminate diastolic filling due to E-A fusion. Right Ventricle:  The right ventricular size is normal. Right vetricular wall thickness was not assessed. Right ventricular systolic function is normal. Left Atrium: Left atrial size was mild to moderately dilated. Right Atrium: Right atrial size was normal in size. Pericardium: There is no evidence of pericardial effusion. Mitral Valve: MV prosthesis (Epic bioprosthesis, 05/01/14) Not well visualized peak and mean gradients through the valveare 19 and 9 mm Hx respectively. Compared to echo reprot for 06/05/19 [facility]) mild increase in mean gradient (6 to 9 mm). The mitral valve has been repaired/replaced. Trivial mitral valve regurgitation. MV peak gradient, 18.5 mmHg. The mean mitral valve gradient is 9.0 mmHg. Tricuspid Valve: The tricuspid valve is grossly normal. Tricuspid valve regurgitation is mild. Aortic Valve: AV prosthesis (23 mm Hancock bioprosthesis, 05/01/14) present Not well visualized. Peak and mean gradients through the valve are 51 and 30 mm Hg respectively Comparee to reprot for 8/117/20 [facility]), mean graidnet is increased. The aortic valve has been repaired/replaced. Aortic valve regurgitation is trivial. Aortic valve mean gradient measures 30.0 mmHg. Aortic valve peak gradient measures 50.7 mmHg. Aortic valve area, by VTI measures 0.43 cm. Pulmonic Valve: The pulmonic valve was not well visualized. Pulmonic valve  regurgitation is not visualized. Aorta: The aortic root is normal in size and structure. Venous: The inferior vena cava is normal in size with greater than 50% respiratory variability, suggesting right atrial pressure of 3 mmHg. IAS/Shunts: The interatrial septum was not assessed.  LEFT VENTRICLE PLAX 2D LVIDd:         5.10 cm  Diastology LVIDs:         3.70 cm  LV e' lateral: 10.30 cm/s LV PW:         0.80 cm LV IVS:        1.00 cm LVOT diam:     2.00 cm LV SV:         33 LV SV Index:   17 LVOT Area:     3.14 cm  RIGHT VENTRICLE          IVC RV Basal diam:  3.80 cm  IVC diam: 1.90 cm LEFT ATRIUM             Index       RIGHT ATRIUM           Index LA diam:        5.20 cm 2.69 cm/m  RA Area:     17.40 cm LA Vol (A2C):   64.0 ml 33.10 ml/m RA Volume:   45.50 ml  23.53 ml/m LA Vol (A4C):   95.3 ml 49.29 ml/m LA Biplane Vol: 82.6 ml 42.72 ml/m  AORTIC VALVE AV Area (Vmax):    0.67 cm AV Area (Vmean):   0.50 cm AV Area (VTI):     0.43 cm AV Vmax:           356.00 cm/s AV Vmean:          262.000 cm/s AV VTI:            0.752 m AV Peak Grad:      50.7 mmHg AV Mean Grad:      30.0 mmHg LVOT Vmax:         75.50 cm/s LVOT Vmean:        42.000 cm/s LVOT VTI:          0.104 m LVOT/AV VTI ratio: 0.14  AORTA Ao Root diam: 3.10 cm MITRAL VALVE  TRICUSPID VALVE MV Area VTI:  0.60 cm   TR Peak grad:   30.9 mmHg MV Peak grad: 18.5 mmHg  TR Vmax:        278.00 cm/s MV Mean grad: 9.0 mmHg MV Vmax:      2.15 m/s   SHUNTS MV Vmean:     139.0 cm/s Systemic VTI:  0.10 m                          Systemic Diam: 2.00 cm Dorris Carnes MD Electronically signed by Dorris Carnes MD Signature Date/Time: 01/06/2021/4:04:54 PM    Final    ECHO TEE  Result Date: 01/08/2021    TRANSESOPHOGEAL ECHO REPORT   Patient Name:   Donald Harrington Date of Exam: 01/08/2021 Medical Rec #:  [id number]    Height:       70.5 in Accession #:    [id number]   Weight:       175.9 lb Date of Birth:  December 07, 1955    BSA:          1.987 m Patient Age:    54  years     BP:           86/59 mmHg Patient Gender: M            HR:           91 bpm. Exam Location:  Inpatient Procedure: Transesophageal Echo, Cardiac Doppler and Color Doppler Indications:     Bacteremia  History:         Patient has prior history of Echocardiogram examinations, most                  recent 01/06/2021. Previous Myocardial Infarction, Stroke,                  Endocarditis and AVR. MVR, Arrythmias:Atrial Fibrillation,                  Signs/Symptoms:Bacteremia; Risk Factors:Hypertension.  Sonographer:     Dustin Flock Referring Phys:  [id number] Tami Lin [facility] Diagnosing Phys: Mertie Moores MD PROCEDURE: The transesophogeal probe was passed without difficulty through the esophogus of the patient. Sedation performed by performing physician. The patient was monitored while under deep sedation. Anesthestetic sedation was provided intravenously by  Anesthesiology: 233.79m of Propofol, 844mof Lidocaine. The patient developed no complications during the procedure. IMPRESSIONS  1. Left ventricular ejection fraction, by estimation, is 55 to 60%. The left ventricle has normal function.  2. Right ventricular systolic function is normal. The right ventricular size is normal.  3. No left atrial/left atrial appendage thrombus was detected.  4. The mitral valve has been repaired/replaced. No evidence of mitral valve regurgitation. No evidence of mitral stenosis.  5. The tricuspid valve is abnormal. Tricuspid valve regurgitation is moderate.  6. The aortic valve has been repaired/replaced. Aortic valve regurgitation is not visualized. No aortic stenosis is present. FINDINGS  Left Ventricle: Left ventricular ejection fraction, by estimation, is 55 to 60%. The left ventricle has normal function. The left ventricular internal cavity size was normal in size. Right Ventricle: The right ventricular size is normal. No increase in right ventricular wall thickness. Right ventricular systolic function is normal.  Left Atrium: Left atrial size was normal in size. No left atrial/left atrial appendage thrombus was detected. Right Atrium: Right atrial size was normal in size. Pericardium: There is no evidence of pericardial effusion. Mitral Valve: The mitral valve has been  repaired/replaced. No evidence of mitral valve regurgitation. There is a bioprosthetic valve present in the mitral position. No evidence of mitral valve stenosis. There is no evidence of mitral valve vegetation. Tricuspid Valve: The tricuspid valve is abnormal. Tricuspid valve regurgitation is moderate. Aortic Valve: The aortic valve has been repaired/replaced. Aortic valve regurgitation is not visualized. No aortic stenosis is present. There is a bioprosthetic valve present in the aortic position. There is no evidence of aortic valve vegetation. Pulmonic Valve: The pulmonic valve was grossly normal. Pulmonic valve regurgitation is trivial. Aorta: The aortic root and ascending aorta are structurally normal, with no evidence of dilitation. There is minimal (Grade I) plaque. IAS/Shunts: No atrial level shunt detected by color flow Doppler. Mertie Moores MD Electronically signed by Mertie Moores MD Signature Date/Time: 01/08/2021/3:13:00 PM    Final    VAS US CAROTID  Result Date: 01/07/2021 Carotid Arterial Duplex Study Indications:       CVA. Risk Factors:      Hypertension, coronary artery disease, prior CVA. 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                    MVR and AVR 2015. PAF, on Coumadin. Comparison Study:  No prior study Performing Technologist: Sharion Dove RVS  Examination Guidelines: A complete evaluation includes B-mode imaging, spectral Doppler, color Doppler, and power Doppler as needed of all accessible portions of each vessel. Bilateral testing is considered an integral part of a complete examination. Limited examinations for reoccurring indications may be performed as noted.  Right Carotid  Findings: +----------+--------+--------+--------+------------------+------------------+           PSV cm/sEDV cm/sStenosisPlaque DescriptionComments           +----------+--------+--------+--------+------------------+------------------+ CCA Prox  79      32                                intimal thickening +----------+--------+--------+--------+------------------+------------------+ CCA Distal82      30                                intimal thickening +----------+--------+--------+--------+------------------+------------------+ ICA Prox  128     49      40-59%  calcific          Shadowing          +----------+--------+--------+--------+------------------+------------------+ ICA Distal101     38                                                   +----------+--------+--------+--------+------------------+------------------+ ECA       134     20                                                   +----------+--------+--------+--------+------------------+------------------+ +----------+--------+-------+--------+-------------------+           PSV cm/sEDV cmsDescribeArm Pressure (mmHG) +----------+--------+-------+--------+-------------------+ Subclavian162                                        +----------+--------+-------+--------+-------------------+ +---------+--------+--+--------+--+  VertebralPSV cm/s69EDV cm/s21 +---------+--------+--+--------+--+  Left Carotid Findings: +----------+--------+--------+--------+------------------+---------+           PSV cm/sEDV cm/sStenosisPlaque DescriptionComments  +----------+--------+--------+--------+------------------+---------+ CCA Prox  140     48              heterogenous                +----------+--------+--------+--------+------------------+---------+ CCA Distal131     32              heterogenous                +----------+--------+--------+--------+------------------+---------+ ICA Prox  140      48      40-59%  calcific          Shadowing +----------+--------+--------+--------+------------------+---------+ ICA Mid   108     48                                          +----------+--------+--------+--------+------------------+---------+ ICA Distal101     38                                          +----------+--------+--------+--------+------------------+---------+ ECA       135     37                                          +----------+--------+--------+--------+------------------+---------+ +----------+--------+--------+--------+-------------------+           PSV cm/sEDV cm/sDescribeArm Pressure (mmHG) +----------+--------+--------+--------+-------------------+ [id number]                                          +----------+--------+--------+--------+-------------------+ +---------+--------+--+--------+--+ VertebralPSV cm/s58EDV cm/s22 +---------+--------+--+--------+--+   Summary: Right Carotid: Velocities in the right ICA are consistent with a 40-59%                stenosis. Left Carotid: Velocities in the left ICA are consistent with a 40-59% stenosis. Vertebrals:  Bilateral vertebral arteries demonstrate antegrade flow. Subclavians: Normal flow hemodynamics were seen in bilateral subclavian              arteries. *See table(s) above for measurements and observations.  Electronically signed by Antony Contras MD on 01/07/2021 at 9:02:50 AM.    Final    [place] EKG SITE RITE  Result Date: 01/10/2021 If Site Rite image not attached, placement could not be confirmed due to current cardiac rhythm.   Subjective: No new complaints.  Discharge Exam: Vitals:   01/14/21 0333 01/14/21 0758  BP: 95/67 100/68  Pulse: 86 80  Resp: 18   Temp: 98.6 F (37 C) 98.4 F (36.9 C)  SpO2: 94% 96%   Vitals:   01/13/21 1950 01/13/21 2323 01/14/21 0333 01/14/21 0758  BP: 95/68 (!) 90/50 95/67 100/68  Pulse: 95 87 86 80  Resp: '18 18 18   ' Temp: 98 F (36.7 C)  98.6 F (37 C) 98.6 F (37 C) 98.4 F (36.9 C)  TempSrc: Oral Oral Oral Oral  SpO2: 97% 97% 94% 96%  Weight:   76.8 kg   Height:        General: Pt is  alert, awake, not in acute distress Cardiovascular: RRR, S1/S2 +, no rubs, no gallops Respiratory: CTA bilaterally, no wheezing, no rhonchi Abdominal: Soft, NT, ND, bowel sounds + Extremities: no edema, no cyanosis    The results of significant diagnostics from this hospitalization (including imaging, microbiology, ancillary and laboratory) are listed below for reference.     Microbiology: Recent Results (from the past 240 hour(s))  Culture, blood (Routine X 2) w Reflex to ID Panel     Status: Abnormal   Collection Time: 01/04/21  3:04 PM   Specimen: BLOOD LEFT HAND  Result Value Ref Range Status   Specimen Description BLOOD LEFT HAND  Final   Special Requests   Final    BOTTLES DRAWN AEROBIC AND ANAEROBIC Blood Culture results may not be optimal due to an inadequate volume of blood received in culture bottles   Culture  Setup Time   Final    GRAM POSITIVE COCCI IN CHAINS IN BOTH AEROBIC AND ANAEROBIC BOTTLES CRITICAL RESULT CALLED TO, READ BACK BY AND VERIFIED WITH: J,MILLEN PHARMD '@[id number]'  01/05/21 EB Performed at [facility], [place] [address], [place], [place] [address]    Culture STREPTOCOCCUS GORDONII (A)  Final   Report Status 01/07/2021 FINAL  Final   Organism ID, Bacteria STREPTOCOCCUS GORDONII  Final      Susceptibility   Streptococcus gordonii - MIC*    PENICILLIN <=0.06 SENSITIVE Sensitive     CEFTRIAXONE <=0.12 SENSITIVE Sensitive     ERYTHROMYCIN 2 RESISTANT Resistant     LEVOFLOXACIN 1 SENSITIVE Sensitive     VANCOMYCIN 0.5 SENSITIVE Sensitive     * STREPTOCOCCUS GORDONII  Blood Culture ID Panel (Reflexed)     Status: Abnormal   Collection Time: 01/04/21  3:04 PM  Result Value Ref Range Status   Enterococcus faecalis NOT DETECTED NOT DETECTED Final   Enterococcus Faecium NOT DETECTED NOT DETECTED Final    Listeria monocytogenes NOT DETECTED NOT DETECTED Final   Staphylococcus species NOT DETECTED NOT DETECTED Final   Staphylococcus aureus (BCID) NOT DETECTED NOT DETECTED Final   Staphylococcus epidermidis NOT DETECTED NOT DETECTED Final   Staphylococcus lugdunensis NOT DETECTED NOT DETECTED Final   Streptococcus species DETECTED (A) NOT DETECTED Final    Comment: Not Enterococcus species, Streptococcus agalactiae, Streptococcus pyogenes, or Streptococcus pneumoniae. CRITICAL RESULT CALLED TO, READ BACK BY AND VERIFIED WITH: J,MILLEN PHARMD '@[id number]'  01/05/21 EB    Streptococcus agalactiae NOT DETECTED NOT DETECTED Final   Streptococcus pneumoniae NOT DETECTED NOT DETECTED Final   Streptococcus pyogenes NOT DETECTED NOT DETECTED Final   A.calcoaceticus-baumannii NOT DETECTED NOT DETECTED Final   Bacteroides fragilis NOT DETECTED NOT DETECTED Final   Enterobacterales NOT DETECTED NOT DETECTED Final   Enterobacter cloacae complex NOT DETECTED NOT DETECTED Final   Escherichia coli NOT DETECTED NOT DETECTED Final   Klebsiella aerogenes NOT DETECTED NOT DETECTED Final   Klebsiella oxytoca NOT DETECTED NOT DETECTED Final   Klebsiella pneumoniae NOT DETECTED NOT DETECTED Final   Proteus species NOT DETECTED NOT DETECTED Final   Salmonella species NOT DETECTED NOT DETECTED Final   Serratia marcescens NOT DETECTED NOT DETECTED Final   Haemophilus influenzae NOT DETECTED NOT DETECTED Final   Neisseria meningitidis NOT DETECTED NOT DETECTED Final   Pseudomonas aeruginosa NOT DETECTED NOT DETECTED Final   Stenotrophomonas maltophilia NOT DETECTED NOT DETECTED Final   Candida albicans NOT DETECTED NOT DETECTED Final   Candida auris NOT DETECTED NOT DETECTED Final   Candida glabrata NOT DETECTED NOT DETECTED Final  Candida krusei NOT DETECTED NOT DETECTED Final   Candida parapsilosis NOT DETECTED NOT DETECTED Final   Candida tropicalis NOT DETECTED NOT DETECTED Final   Cryptococcus neoformans/gattii  NOT DETECTED NOT DETECTED Final    Comment: Performed at [facility], [place] [address] [place], [place] [address]  Culture, blood (Routine X 2) w Reflex to ID Panel     Status: Abnormal   Collection Time: 01/04/21  3:11 PM   Specimen: BLOOD LEFT HAND  Result Value Ref Range Status   Specimen Description BLOOD LEFT HAND  Final   Special Requests   Final    BOTTLES DRAWN AEROBIC ONLY Blood Culture adequate volume   Culture  Setup Time   Final    GRAM POSITIVE COCCI IN CHAINS AEROBIC BOTTLE ONLY CRITICAL VALUE NOTED.  VALUE IS CONSISTENT WITH PREVIOUSLY REPORTED AND CALLED VALUE.    Culture (A)  Final    STREPTOCOCCUS GORDONII SUSCEPTIBILITIES PERFORMED ON PREVIOUS CULTURE WITHIN THE LAST 5 DAYS. Performed at [facility], [place] [address], [place], [place] [address]    Report Status 01/07/2021 FINAL  Final  Resp Panel by RT-PCR (Flu A&B, Covid) Nasopharyngeal Swab     Status: None   Collection Time: 01/05/21  6:31 AM   Specimen: Nasopharyngeal Swab; Nasopharyngeal(NP) swabs in vial transport medium  Result Value Ref Range Status   SARS Coronavirus 2 by RT PCR NEGATIVE NEGATIVE Final    Comment: (NOTE) SARS-CoV-2 target nucleic acids are NOT DETECTED.  The SARS-CoV-2 RNA is generally detectable in upper respiratory specimens during the acute phase of infection. The lowest concentration of SARS-CoV-2 viral copies this assay can detect is 138 copies/mL. A negative result does not preclude SARS-Cov-2 infection and should not be used as the sole basis for treatment or other patient management decisions. A negative result may occur with  improper specimen collection/handling, submission of specimen other than nasopharyngeal swab, presence of viral mutation(s) within the areas targeted by this assay, and inadequate number of viral copies(<138 copies/mL). A negative result must be combined with clinical observations, patient history, and epidemiological information. The  expected result is Negative.  Fact Sheet for Patients:  EntrepreneurPulse.com.au  Fact Sheet for Healthcare Providers:  IncredibleEmployment.be  This test is no t yet approved or cleared by the [place] FDA and  has been authorized for detection and/or diagnosis of SARS-CoV-2 by FDA under an Emergency Use Authorization (EUA). This EUA will remain  in effect (meaning this test can be used) for the duration of the COVID-19 declaration under Section 564(b)(1) of the Act, 21 U.S.C.section 360bbb-3(b)(1), unless the authorization is terminated  or revoked sooner.       Influenza A by PCR NEGATIVE NEGATIVE Final   Influenza B by PCR NEGATIVE NEGATIVE Final    Comment: (NOTE) The Xpert Xpress SARS-CoV-2/FLU/RSV plus assay is intended as an aid in the diagnosis of influenza from Nasopharyngeal swab specimens and should not be used as a sole basis for treatment. Nasal washings and aspirates are unacceptable for Xpert Xpress SARS-CoV-2/FLU/RSV testing.  Fact Sheet for Patients: EntrepreneurPulse.com.au  Fact Sheet for Healthcare Providers: IncredibleEmployment.be  This test is not yet approved or cleared by the [place] FDA and has been authorized for detection and/or diagnosis of SARS-CoV-2 by FDA under an Emergency Use Authorization (EUA). This EUA will remain in effect (meaning this test can be used) for the duration of the COVID-19 declaration under Section 564(b)(1) of the Act, 21 U.S.C. section 360bbb-3(b)(1), unless the authorization  is terminated or revoked.  Performed at [facility], [place] [address] [place], [place] [address]   Culture, blood (Routine X 2) w Reflex to ID Panel     Status: None   Collection Time: 01/07/21 11:24 AM   Specimen: BLOOD RIGHT FOREARM  Result Value Ref Range Status   Specimen Description BLOOD RIGHT FOREARM  Final   Special Requests   Final    BOTTLES  DRAWN AEROBIC AND ANAEROBIC Blood Culture results may not be optimal due to an inadequate volume of blood received in culture bottles   Culture   Final    NO GROWTH 5 DAYS Performed at [facility], [place] [address], [place], [place] [address]    Report Status 01/12/2021 FINAL  Final  Culture, blood (Routine X 2) w Reflex to ID Panel     Status: None   Collection Time: 01/07/21 11:29 AM   Specimen: BLOOD RIGHT HAND  Result Value Ref Range Status   Specimen Description BLOOD RIGHT HAND  Final   Special Requests   Final    BOTTLES DRAWN AEROBIC AND ANAEROBIC Blood Culture results may not be optimal due to an inadequate volume of blood received in culture bottles   Culture   Final    NO GROWTH 5 DAYS Performed at [facility], [place] [address] [place], [place] [address]    Report Status 01/12/2021 FINAL  Final     Labs: BNP (last 3 results) Recent Labs    01/04/21 0322  BNP 448.1*   Basic Metabolic Panel: Recent Labs  Lab 01/08/21 0640 01/10/21 0325 01/12/21 0213  NA 138 137 136  K 4.2 4.8 4.2  CL 107 107 102  CO2 '27 27 28  ' GLUCOSE 92 96 88  BUN '10 9 9  ' CREATININE 0.93 1.11 1.14  CALCIUM 8.0* 8.3* 8.6*   Liver Function Tests: Recent Labs  Lab 01/08/21 0640  AST 12*  ALT 9  ALKPHOS 38  BILITOT 0.6  PROT 4.9*  ALBUMIN 2.1*   No results for input(s): LIPASE, AMYLASE in the last 168 hours. No results for input(s): AMMONIA in the last 168 hours. CBC: Recent Labs  Lab 01/08/21 0640 01/10/21 0325 01/11/21 0321 01/12/21 0213  WBC 4.0 4.9 4.6 4.5  HGB 9.1* 9.0* 8.9* 9.4*  HCT 27.4* 28.2* 28.1* 28.3*  MCV 101.1* 104.4* 102.9* 101.4*  PLT 145* 156 164 172   Cardiac Enzymes: No results for input(s): CKTOTAL, CKMB, CKMBINDEX, TROPONINI in the last 168 hours. BNP: Invalid input(s): POCBNP CBG: Recent Labs  Lab 01/13/21 1630  GLUCAP 114*   D-Dimer No results for input(s): DDIMER in the last 72 hours. Hgb A1c No results for input(s): HGBA1C in  the last 72 hours. Lipid Profile No results for input(s): CHOL, HDL, LDLCALC, TRIG, CHOLHDL, LDLDIRECT in the last 72 hours. Thyroid function studies No results for input(s): TSH, T4TOTAL, T3FREE, THYROIDAB in the last 72 hours.  Invalid input(s): FREET3 Anemia work up No results for input(s): VITAMINB12, FOLATE, FERRITIN, TIBC, IRON, RETICCTPCT in the last 72 hours. Urinalysis    Component Value Date/Time   COLORURINE YELLOW 01/04/2021 1729   APPEARANCEUR CLEAR 01/04/2021 1729   LABSPEC 1.012 01/04/2021 1729   PHURINE 5.0 01/04/2021 1729   GLUCOSEU NEGATIVE 01/04/2021 1729   HGBUR MODERATE (A) 01/04/2021 1729   BILIRUBINUR NEGATIVE 01/04/2021 1729   KETONESUR NEGATIVE 01/04/2021 1729   PROTEINUR NEGATIVE 01/04/2021 1729   NITRITE NEGATIVE 01/04/2021 1729   LEUKOCYTESUR NEGATIVE 01/04/2021  1729   Sepsis Labs Invalid input(s): PROCALCITONIN,  WBC,  LACTICIDVEN Microbiology Recent Results (from the past 240 hour(s))  Culture, blood (Routine X 2) w Reflex to ID Panel     Status: Abnormal   Collection Time: 01/04/21  3:04 PM   Specimen: BLOOD LEFT HAND  Result Value Ref Range Status   Specimen Description BLOOD LEFT HAND  Final   Special Requests   Final    BOTTLES DRAWN AEROBIC AND ANAEROBIC Blood Culture results may not be optimal due to an inadequate volume of blood received in culture bottles   Culture  Setup Time   Final    GRAM POSITIVE COCCI IN CHAINS IN BOTH AEROBIC AND ANAEROBIC BOTTLES CRITICAL RESULT CALLED TO, READ BACK BY AND VERIFIED WITH: J,MILLEN PHARMD '@[id number]'  01/05/21 EB Performed at [facility], [place] [address], [place], [place] [address]    Culture STREPTOCOCCUS GORDONII (A)  Final   Report Status 01/07/2021 FINAL  Final   Organism ID, Bacteria STREPTOCOCCUS GORDONII  Final      Susceptibility   Streptococcus gordonii - MIC*    PENICILLIN <=0.06 SENSITIVE Sensitive     CEFTRIAXONE <=0.12 SENSITIVE Sensitive     ERYTHROMYCIN 2 RESISTANT Resistant      LEVOFLOXACIN 1 SENSITIVE Sensitive     VANCOMYCIN 0.5 SENSITIVE Sensitive     * STREPTOCOCCUS GORDONII  Blood Culture ID Panel (Reflexed)     Status: Abnormal   Collection Time: 01/04/21  3:04 PM  Result Value Ref Range Status   Enterococcus faecalis NOT DETECTED NOT DETECTED Final   Enterococcus Faecium NOT DETECTED NOT DETECTED Final   Listeria monocytogenes NOT DETECTED NOT DETECTED Final   Staphylococcus species NOT DETECTED NOT DETECTED Final   Staphylococcus aureus (BCID) NOT DETECTED NOT DETECTED Final   Staphylococcus epidermidis NOT DETECTED NOT DETECTED Final   Staphylococcus lugdunensis NOT DETECTED NOT DETECTED Final   Streptococcus species DETECTED (A) NOT DETECTED Final    Comment: Not Enterococcus species, Streptococcus agalactiae, Streptococcus pyogenes, or Streptococcus pneumoniae. CRITICAL RESULT CALLED TO, READ BACK BY AND VERIFIED WITH: J,MILLEN PHARMD '@[id number]'  01/05/21 EB    Streptococcus agalactiae NOT DETECTED NOT DETECTED Final   Streptococcus pneumoniae NOT DETECTED NOT DETECTED Final   Streptococcus pyogenes NOT DETECTED NOT DETECTED Final   A.calcoaceticus-baumannii NOT DETECTED NOT DETECTED Final   Bacteroides fragilis NOT DETECTED NOT DETECTED Final   Enterobacterales NOT DETECTED NOT DETECTED Final   Enterobacter cloacae complex NOT DETECTED NOT DETECTED Final   Escherichia coli NOT DETECTED NOT DETECTED Final   Klebsiella aerogenes NOT DETECTED NOT DETECTED Final   Klebsiella oxytoca NOT DETECTED NOT DETECTED Final   Klebsiella pneumoniae NOT DETECTED NOT DETECTED Final   Proteus species NOT DETECTED NOT DETECTED Final   Salmonella species NOT DETECTED NOT DETECTED Final   Serratia marcescens NOT DETECTED NOT DETECTED Final   Haemophilus influenzae NOT DETECTED NOT DETECTED Final   Neisseria meningitidis NOT DETECTED NOT DETECTED Final   Pseudomonas aeruginosa NOT DETECTED NOT DETECTED Final   Stenotrophomonas maltophilia NOT DETECTED NOT DETECTED Final    Candida albicans NOT DETECTED NOT DETECTED Final   Candida auris NOT DETECTED NOT DETECTED Final   Candida glabrata NOT DETECTED NOT DETECTED Final   Candida krusei NOT DETECTED NOT DETECTED Final   Candida parapsilosis NOT DETECTED NOT DETECTED Final   Candida tropicalis NOT DETECTED NOT DETECTED Final   Cryptococcus neoformans/gattii NOT DETECTED NOT DETECTED Final    Comment: Performed at [facility], [address] [place], [place] [address]  Culture, blood (Routine X 2) w Reflex to ID Panel     Status: Abnormal   Collection Time: 01/04/21  3:11 PM   Specimen: BLOOD LEFT HAND  Result Value Ref Range Status   Specimen Description BLOOD LEFT HAND  Final   Special Requests   Final    BOTTLES DRAWN AEROBIC ONLY Blood Culture adequate volume   Culture  Setup Time   Final    GRAM POSITIVE COCCI IN CHAINS AEROBIC BOTTLE ONLY CRITICAL VALUE NOTED.  VALUE IS CONSISTENT WITH PREVIOUSLY REPORTED AND CALLED VALUE.    Culture (A)  Final    STREPTOCOCCUS GORDONII SUSCEPTIBILITIES PERFORMED ON PREVIOUS CULTURE WITHIN THE LAST 5 DAYS. Performed at [facility], [place] [address] [place], [place] [address]    Report Status 01/07/2021 FINAL  Final  Resp Panel by RT-PCR (Flu A&B, Covid) Nasopharyngeal Swab     Status: None   Collection Time: 01/05/21  6:31 AM   Specimen: Nasopharyngeal Swab; Nasopharyngeal(NP) swabs in vial transport medium  Result Value Ref Range Status   SARS Coronavirus 2 by RT PCR NEGATIVE NEGATIVE Final    Comment: (NOTE) SARS-CoV-2 target nucleic acids are NOT DETECTED.  The SARS-CoV-2 RNA is generally detectable in upper respiratory specimens during the acute phase of infection. The lowest concentration of SARS-CoV-2 viral copies this assay can detect is 138 copies/mL. A negative result does not preclude SARS-Cov-2 infection and should not be used as the sole basis for treatment or other patient management decisions. A negative result may occur with   improper specimen collection/handling, submission of specimen other than nasopharyngeal swab, presence of viral mutation(s) within the areas targeted by this assay, and inadequate number of viral copies(<138 copies/mL). A negative result must be combined with clinical observations, patient history, and epidemiological information. The expected result is Negative.  Fact Sheet for Patients:  EntrepreneurPulse.com.au  Fact Sheet for Healthcare Providers:  IncredibleEmployment.be  This test is no t yet approved or cleared by the [place] FDA and  has been authorized for detection and/or diagnosis of SARS-CoV-2 by FDA under an Emergency Use Authorization (EUA). This EUA will remain  in effect (meaning this test can be used) for the duration of the COVID-19 declaration under Section 564(b)(1) of the Act, 21 U.S.C.section 360bbb-3(b)(1), unless the authorization is terminated  or revoked sooner.       Influenza A by PCR NEGATIVE NEGATIVE Final   Influenza B by PCR NEGATIVE NEGATIVE Final    Comment: (NOTE) The Xpert Xpress SARS-CoV-2/FLU/RSV plus assay is intended as an aid in the diagnosis of influenza from Nasopharyngeal swab specimens and should not be used as a sole basis for treatment. Nasal washings and aspirates are unacceptable for Xpert Xpress SARS-CoV-2/FLU/RSV testing.  Fact Sheet for Patients: EntrepreneurPulse.com.au  Fact Sheet for Healthcare Providers: IncredibleEmployment.be  This test is not yet approved or cleared by the [place] FDA and has been authorized for detection and/or diagnosis of SARS-CoV-2 by FDA under an Emergency Use Authorization (EUA). This EUA will remain in effect (meaning this test can be used) for the duration of the COVID-19 declaration under Section 564(b)(1) of the Act, 21 U.S.C. section 360bbb-3(b)(1), unless the authorization is terminated  or revoked.  Performed at [facility], [place] [address], [place], [place] [address]   Culture, blood (Routine X 2) w Reflex to ID Panel     Status: None   Collection Time: 01/07/21 11:24 AM  Specimen: BLOOD RIGHT FOREARM  Result Value Ref Range Status   Specimen Description BLOOD RIGHT FOREARM  Final   Special Requests   Final    BOTTLES DRAWN AEROBIC AND ANAEROBIC Blood Culture results may not be optimal due to an inadequate volume of blood received in culture bottles   Culture   Final    NO GROWTH 5 DAYS Performed at [facility], [place] [address], [place], [place] [address]    Report Status 01/12/2021 FINAL  Final  Culture, blood (Routine X 2) w Reflex to ID Panel     Status: None   Collection Time: 01/07/21 11:29 AM   Specimen: BLOOD RIGHT HAND  Result Value Ref Range Status   Specimen Description BLOOD RIGHT HAND  Final   Special Requests   Final    BOTTLES DRAWN AEROBIC AND ANAEROBIC Blood Culture results may not be optimal due to an inadequate volume of blood received in culture bottles   Culture   Final    NO GROWTH 5 DAYS Performed at [facility], [place] [address], [place], [place] [address]    Report Status 01/12/2021 FINAL  Final     Time coordinating discharge: Over 30 minutes  SIGNED:   [name], MD  [facility] 01/14/2021, 10:01 AM Pager   If 7PM-7AM, please contact night-coverage [email or web address] Password [email or web address]

## 2021-01-15 LAB — BASIC METABOLIC PANEL
Anion gap: 7 (ref 5–15)
BUN: 9 mg/dL (ref 8–23)
CO2: 27 mmol/L (ref 22–32)
Calcium: 8.9 mg/dL (ref 8.9–10.3)
Chloride: 102 mmol/L (ref 98–111)
Creatinine, Ser: 1.05 mg/dL (ref 0.61–1.24)
GFR, Estimated: >60 mL/min (ref >60)
Glucose, Bld: 91 mg/dL (ref 70–99)
Potassium: 4.1 mmol/L (ref 3.5–5.1)
Sodium: 136 mmol/L (ref 135–145)

## 2021-01-15 LAB — GLUCOSE, CAPILLARY: Glucose-Capillary: 93 mg/dL (ref 70–99)

## 2021-01-15 MED ORDER — LACTATED RINGERS IV BOLUS
1000.0000 mL | Freq: Once | INTRAVENOUS | Status: AC
  Administered 2021-01-16: 1000 mL via INTRAVENOUS

## 2021-01-15 NOTE — Progress Notes (Signed)
[facility] night shift.  The staff reported that the patient has been having hypotension, most recently 88/68 mmHg.  At 1000 mL LR bolus over 2 hours was ordered.  Tennis Must, MD.

## 2021-01-15 NOTE — Plan of Care (Signed)
  Problem: Education: Goal: Knowledge of patient specific risk factors addressed and post discharge goals established will improve Outcome: Progressing   Problem: Education: Goal: Knowledge of General Education information will improve Description: Including pain rating scale, medication(s)/side effects and non-pharmacologic comfort measures Outcome: Progressing   Problem: Health Behavior/Discharge Planning: Goal: Ability to manage health-related needs will improve Outcome: Progressing   Problem: Clinical Measurements: Goal: Ability to maintain clinical measurements within normal limits will improve Outcome: Progressing Goal: Will remain free from infection Outcome: Progressing Goal: Diagnostic test results will improve Outcome: Progressing Goal: Respiratory complications will improve Outcome: Progressing Goal: Cardiovascular complication will be avoided Outcome: Progressing   Problem: Activity: Goal: Risk for activity intolerance will decrease Outcome: Progressing   Problem: Nutrition: Goal: Adequate nutrition will be maintained Outcome: Progressing   Problem: Elimination: Goal: Will not experience complications related to bowel motility Outcome: Progressing Goal: Will not experience complications related to urinary retention Outcome: Progressing   Problem: Pain Managment: Goal: General experience of comfort will improve Outcome: Progressing   Problem: Safety: Goal: Ability to remain free from injury will improve Outcome: Progressing   Problem: Skin Integrity: Goal: Risk for impaired skin integrity will decrease Outcome: Progressing

## 2021-01-15 NOTE — Progress Notes (Signed)
[facility] PROGRESS NOTE    Progress Note  Donald Harrington  FMB:[id number] DOB: [birth year] DOA: 01/03/2021 PCP: Patient, No Pcp Per (Inactive)     Brief Narrative:   Donald Harrington is an 65 y.o. 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  Reports no abdominal pain nausea or vomiting black stools or red stools.  He is found to be tachycardic blood cultures were sent which were positive for Streptococcus ID was consulted  Significant events: EGD on 01/06/2019 due to shortness of gastritis, duodenal ulcer but no evidence of bleeding. Status post 2 units packed red blood cells 01/08/2019 orthopantogram multiple dental caries with residual dentition lucency within the maxilla related to absent tooth 10 which may represent an residual periapical abscess. 01/06/2021 2D echocardiogram showed poor acoustic window EF of 55 MV bioprosthetic valve not well visualized with a mean gradient of 9 mmHg.  AV bioprosthetic valve not well visualized with a mean gradient of 30 mmHg  Patient is medically stable awaiting inpatient rehab placement. Antibiotics: 3.21.2022 rocephin 3.21.2022 Gentamacin  3.22.2022 PCN  Microbiology data: 01/04/2021 blood culture: Streptococcus [place] 01/07/2021 blood cultures negative till date.  Procedures: 01/08/2021 TEE no vegetation  Assessment/Plan:   Acute blood loss anemia/upper GI bleed: He reports taking Goody powder for neck pain. Status post 2 units of packed red blood cells hemoglobin has improved.   No bleeding.Hemoglobin stabilized  Acute multifocal embolic strokes: Likely in the setting of subtherapeutic INR, history of A. fib versus septic  emboli/given bacteremia. Long discussion with cardiologist Dr. Ellyn Hack after trying to talk with [name] at [facility] cardiology his regular cardiologist Patient has dual indication for anticoagulation-CVA-paroxysmal A. fib-he was subtherapeutic on Coumadin therefore has been transition to [place] Patient is upset about this and I tried and attempted to give him clear answers with regards to the same-he remains upset in general  Sepsis due to Streptococcus bacteremia/concern for endocarditis/in the setting of periodontal disease: Sepsis physiology on 01/05/2021, now resolved. Complete IV gentamicin/penicillin and then 4 weeks of penicillin as per O. Pat orders ending on 02/17/2021 Dental recommended no surgical intervention at this point, they recommended follow-up as an outpatient.  Paroxysmal atrial fibrillation: In sinus rhythm rate controlled, cont Eliquis  Essential hypertension: Continue meds   DVT prophylaxis: heparin Family Communication:none Status is: Inpatient  Remains inpatient appropriate because:Hemodynamically unstable   Dispo: The patient is from: Home              Anticipated d/c is to: SNF              Patient currently is not medically stable to d/c.   Difficult to place patient No    Code Status:     Code Status Orders  (From admission, onward)         Start     Ordered   01/03/21 2051  Full code  Continuous        01/03/21 2052        Code Status History    This patient has a current code status but no historical code status.   Advance Care Planning Activity        IV Access:    Peripheral IV  Procedures and diagnostic studies:   No results found.   Medical Consultants:    None.   Subjective:   Patient is initially unwilling to chat with me and then subsequently opens up to voice all of his complaints about his hospitalization   Objective:    Vitals:   01/14/21 2328 01/15/21 0322 01/15/21 0840 01/15/21 1149  BP: 95/65  (!) 87/61 97/69 101/69  Pulse: 74 80 87 80  Resp: 18 18 18 19   Temp: 98.6 F (37 C) 98.5 F (36.9 C) 98.4 F (36.9 C) 98.3 F (36.8 C)  TempSrc: Oral Oral Oral Axillary  SpO2: 94% 92% 98% 94%  Weight:  76.5 kg    Height:       SpO2: 94 % O2 Flow Rate (L/min): 3 L/min FiO2 (%): 28 %   Intake/Output Summary (Last 24 hours) at 01/15/2021 1335 Last data filed at 01/15/2021 1230 Gross per 24 hour  Intake --  Output 1050 ml  Net -1050 ml   Filed Weights   01/13/21 0500 01/14/21 0333 01/15/21 0322  Weight: 77.7 kg 76.8 kg 76.5 kg    Exam: Coherent no distress EOMI NCAT no focal deficit poor dentition CTA B no added sounds S1-S2 no murmur no rub no gallop Abdomen soft no rebound no guarding Neurologically intact moving all 4 limbs  Data Reviewed:    Labs: Basic Metabolic Panel: Recent Labs  Lab 01/10/21 0325 01/12/21 0213 01/15/21 1119  NA 137 136 136  K 4.8 4.2 4.1  CL 107 102 102  CO2 27 28 27   GLUCOSE 96 88 91  BUN 9 9 9   CREATININE 1.11 1.14 1.05  CALCIUM 8.3* 8.6* 8.9   GFR Estimated Creatinine Clearance: 72.6 mL/min (by C-G formula based on SCr of 1.05 mg/dL). Liver Function Tests: No results for input(s): AST, ALT, ALKPHOS, BILITOT, PROT, ALBUMIN in the last 168 hours. No results for input(s): LIPASE, AMYLASE in the last 168 hours. No results for input(s): AMMONIA in the last 168 hours. Coagulation profile No results for input(s): INR, PROTIME in the last 168 hours. COVID-19 Labs  No results for input(s): DDIMER, FERRITIN, LDH, CRP in the last 72 hours.  Lab Results  Component Value Date   [place] NEGATIVE 01/05/2021    CBC: Recent Labs  Lab 01/10/21 0325 01/11/21 0321 01/12/21 0213  WBC 4.9 4.6 4.5  HGB 9.0* 8.9* 9.4*  HCT 28.2* 28.1* 28.3*  MCV 104.4* 102.9* 101.4*  PLT 156 164 172   Cardiac Enzymes: No results for input(s): CKTOTAL, CKMB, CKMBINDEX, TROPONINI in the last 168 hours. BNP (last 3 results) No results for  input(s): PROBNP in the last 8760 hours. CBG: Recent Labs  Lab 01/13/21 1630 01/15/21 1317  GLUCAP 114* 93   D-Dimer: No results for input(s): DDIMER in the last 72 hours. Hgb A1c: No results for input(s): HGBA1C in the last 72 hours. Lipid Profile: No results for input(s): CHOL, HDL, LDLCALC, TRIG, CHOLHDL, LDLDIRECT in the last 72 hours. Thyroid function studies: No results for input(s): TSH, T4TOTAL, T3FREE, THYROIDAB in the last 72 hours.  Invalid input(s): FREET3 Anemia work up: No results for input(s): VITAMINB12, FOLATE, FERRITIN, TIBC, IRON, RETICCTPCT in the last 72 hours. Sepsis Labs: Recent Labs  Lab 01/10/21 0325 01/11/21 0321 01/12/21 0213  WBC 4.9 4.6 4.5   Microbiology Recent Results (from the past 240 hour(s))  Culture, blood (Routine X 2) w Reflex to ID Panel     Status: None   Collection Time: 01/07/21 11:24 AM  Specimen: BLOOD RIGHT FOREARM  Result Value Ref Range Status   Specimen Description BLOOD RIGHT FOREARM  Final   Special Requests   Final    BOTTLES DRAWN AEROBIC AND ANAEROBIC Blood Culture results may not be optimal due to an inadequate volume of blood received in culture bottles   Culture   Final    NO GROWTH 5 DAYS Performed at [facility], [place] [address], [place], [place] [address]    Report Status 01/12/2021 FINAL  Final  Culture, blood (Routine X 2) w Reflex to ID Panel     Status: None   Collection Time: 01/07/21 11:29 AM   Specimen: BLOOD RIGHT HAND  Result Value Ref Range Status   Specimen Description BLOOD RIGHT HAND  Final   Special Requests   Final    BOTTLES DRAWN AEROBIC AND ANAEROBIC Blood Culture results may not be optimal due to an inadequate volume of blood received in culture bottles   Culture   Final    NO GROWTH 5 DAYS Performed at [facility], [place] [address], [place], [place] [address]    Report Status 01/12/2021 FINAL  Final     Medications:   . sodium chloride   Intravenous Once  . apixaban   5 mg Oral BID  . Chlorhexidine Gluconate Cloth  6 each Topical Daily  . levothyroxine  25 mcg Oral q AM  . melatonin  5 mg Oral QHS  . metoprolol succinate  50 mg Oral Daily  . pantoprazole  40 mg Oral BID  . potassium chloride  10 mEq Oral Daily   Continuous Infusions: . sodium chloride 10 mL/hr at 01/08/21 1039  . gentamicin 200 mg (01/14/21 2220)  . penicillin g continuous IV infusion 12 Million Units (01/15/21 0651)      LOS: 12 days   Jai-Gurmukh Ciela Mahajan  [facility]  01/15/2021, 1:35 PM

## 2021-01-15 NOTE — Progress Notes (Signed)
Paged Dr Abundio Miu to report bp of 87/53 with dynamap and 88/68 manually.  Pt is otherwise asymptomatic.  Said he just needs to be left alone, as he is irritated that he has to be awakened to get his vitals taken.  I did remind him that if he had not been awakened, we would not have found that his BP is so low.  I did tell him I had to notify MD to see if any orders will be given.  He said ok.

## 2021-01-15 NOTE — Progress Notes (Signed)
Physical Therapy Treatment Patient Details Name: Christina Waldrop MRN: [id number] DOB: 06-10-1956 Today's Date: 01/15/2021    History of Present Illness 65 y.o. male presenting from outpatient MRI showing multifocal acute embolic strokes presents (subcentimeter bilateral cerebral and cerebellar infarcts). Also presenting with acute anemia and dark stools concerning for a GI bleed. Chest x-ray impression of COPD and bibasilar atelectasis or infiltrates, new since prior study. 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    PT Comments    Patient pleasant this session and agreeable to PT session. Patient ambulated 400' with SPC and min guard, no overt LOB noted. Patient with difficulty multi-tasking during ambulation, stops ambulating to talk. Patient continues to demo balance deficits but patient unaware. Continue to recommend SNF for ongoing Physical Therapy.      Follow Up Recommendations  SNF     Equipment Recommendations  None recommended by PT    Recommendations for Other Services       Precautions / Restrictions Precautions Precautions: Fall Precaution Comments: HOH; HR < 130 (return to bed if >130) Restrictions Weight Bearing Restrictions: No    Mobility  Bed Mobility Overal bed mobility: Modified Independent                  Transfers Overall transfer level: Needs assistance Equipment used: Straight cane Transfers: Sit to/from Stand Sit to Stand: Min guard         General transfer comment: Min guard A for safety  Ambulation/Gait Ambulation/Gait assistance: Min guard Gait Distance (Feet): 400 Feet Assistive device: Straight cane Gait Pattern/deviations: Step-through pattern;Decreased stride length;Drifts right/left;Wide base of support Gait velocity: decr   General Gait Details: min  guard for safety, no overt LOB noted this session. Drifts L/R throughout. Patient states this is his baseline. Unable to carry conversation while ambulating. Patient stops to talk. Encouraged patient to continue ambulating while he talks   Stairs             Wheelchair Mobility    Modified Rankin (Stroke Patients Only) Modified Rankin (Stroke Patients Only) Pre-Morbid Rankin Score: No symptoms Modified Rankin: Moderately severe disability     Balance Overall balance assessment: Needs assistance Sitting-balance support: No upper extremity supported;Feet supported Sitting balance-Leahy Scale: Good     Standing balance support: Single extremity supported;During functional activity Standing balance-Leahy Scale: Poor Standing balance comment: Reliant on at least unilateral UE support with mobility                            Cognition Arousal/Alertness: Awake/alert Behavior During Therapy: WFL for tasks assessed/performed Overall Cognitive Status: Impaired/Different from baseline Area of Impairment: Memory;Problem solving;Safety/judgement;Attention;Following commands                   Current Attention Level: Sustained Memory: Decreased recall of precautions;Decreased short-term memory Following Commands: Follows one step commands with increased time Safety/Judgement: Decreased awareness of safety   Problem Solving: Slow processing;Requires verbal cues General Comments: Tangential behavior in conversation. Difficulty multi-tasking with patient stopping during ambulation to talk      Exercises      General Comments        Pertinent Vitals/Pain Pain Assessment: No/denies pain    Home Living  Prior Function            PT Goals (current goals can now be found in the care plan section) Acute Rehab PT Goals Patient Stated Goal: to get out of here PT Goal Formulation: With patient Time For Goal Achievement:  01/19/21 Potential to Achieve Goals: Good Progress towards PT goals: Progressing toward goals    Frequency    Min 3X/week      PT Plan Current plan remains appropriate    Co-evaluation              AM-PAC PT "6 Clicks" Mobility   Outcome Measure  Help needed turning from your back to your side while in a flat bed without using bedrails?: None Help needed moving from lying on your back to sitting on the side of a flat bed without using bedrails?: None Help needed moving to and from a bed to a chair (including a wheelchair)?: A Little Help needed standing up from a chair using your arms (e.g., wheelchair or bedside chair)?: A Little Help needed to walk in hospital room?: A Little Help needed climbing 3-5 steps with a railing? : A Lot 6 Click Score: 19    End of Session Equipment Utilized During Treatment: Gait belt Activity Tolerance: Patient tolerated treatment well Patient left: in bed;with call bell/phone within reach;with bed alarm set Nurse Communication: Mobility status PT Visit Diagnosis: Unsteadiness on feet (R26.81);Other abnormalities of gait and mobility (R26.89);Muscle weakness (generalized) (M62.81);Difficulty in walking, not elsewhere classified (R26.2);Other symptoms and signs involving the nervous system (R29.898)     Time: 2585-2778 PT Time Calculation (min) (ACUTE ONLY): 25 min  Charges:  $Gait Training: 23-37 mins                     Jaylend Reiland A. Gilford Rile PT, DPT Acute Rehabilitation Services Pager [phone] Office [phone]    Linna Hoff 01/15/2021, 5:14 PM

## 2021-01-15 NOTE — TOC Progression Note (Signed)
Transition of Care [facility]) - Progression Note    Patient Details  Name: Donald Harrington MRN: [id number] Date of Birth: 1956-01-14  Transition of Care [facility]) CM/SW Contact  Pollie Friar, RN Phone Number: 01/15/2021, 11:20 AM  Clinical Narrative:    Patient has bed offers for SNF. He prefers [facility] but the facility wont have a private room. He has chosen to d/c to [facility]. CM has updated [facility] and they will begin the insurance authorization for admission.  TOC following.   Expected Discharge Plan: [place] Barriers to Discharge: Continued Medical Work up  Expected Discharge Plan and Services Expected Discharge Plan: [place] In-house Referral: Clinical Social Work Discharge Planning Services: CM Consult Post Acute Care Choice: [place] Living arrangements for the past 2 months: Apartment Expected Discharge Date: 01/13/21                                     Social Determinants of Health (SDOH) Interventions    Readmission Risk Interventions No flowsheet data found.

## 2021-01-15 NOTE — Progress Notes (Signed)
Dr. Verlon Au present at bedside to discuss patient's plan of care. Patient stated several times that he was "a patient at [facility]", that he was being "kept here" and that "they just want to make money off me" Patient was also upset about being switched to Eliquis. Education provided on switch to Eliquis from Coumadin and that patient's levels were subtherapeutic, which was one reason for this change. Patient called his outpatient cardiologist's office to allow Dr. Verlon Au to speak to his cardiologist, but was unable to reach them. Dr. Verlon Au then informs patient that he will put a consult in with cardiology here. Patient states " You're a grown man, do what you gotta do" Will continue to monitor

## 2021-01-16 NOTE — TOC Progression Note (Signed)
Transition of Care [facility]) - Progression Note    Patient Details  Name: Maykel Reitter MRN: [id number] Date of Birth: 03-08-1956  Transition of Care [facility]) CM/SW Contact  Pollie Friar, RN Phone Number: 01/16/2021, 3:32 PM  Clinical Narrative:    Received word from [facility] that they need further information to decide on SNF authorization. CM has faxed them the needed information to: [phone] Awaiting to hear decision from [facility]. TOC following.   Expected Discharge Plan: [place] Barriers to Discharge: Continued Medical Work up  Expected Discharge Plan and Services Expected Discharge Plan: [place] In-house Referral: Clinical Social Work Discharge Planning Services: CM Consult Post Acute Care Choice: [place] Living arrangements for the past 2 months: Apartment Expected Discharge Date: 01/13/21                                     Social Determinants of Health (SDOH) Interventions    Readmission Risk Interventions No flowsheet data found.

## 2021-01-16 NOTE — Progress Notes (Signed)
PROGRESS NOTE   Donald Harrington  IRJ:[id number] DOB: [birth year] DOA: 01/03/2021 PCP: Patient, No Pcp Per (Inactive) patient followed chronically by Dr. 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 fib status post cardioversion 05/2014  And NICM, dyslipidemia, continued smoker Patient has been maintained on warfarin mainly because of atrial arrhythmias Echo 2020 done at cardiology office showed preserved LVEF normally functioning prosthetic valves He also is supposed to be seen for bladder cancer surgery by his urologist  He came in with multifocal strokes in the setting of taking Goody powders for neck pain found to have tachycardia and blood culture showed Streptococcus       Hospital-Problem based course  Acute blood loss anemia upper GI bleed Received 2 units of PRBC this admission hemoglobin is stabilized Acute multifocal strokes Secondary to subtherapeutic INR I discussed the case with [name] cardiology after unsuccessfulattempt at discussion with her cardiologist at [facility] cardiology [name] Patient has dual indication for anticoagulation CVA and paroxysmal A. fib as he was subtherapeutic I feel it is reasonable based on [name] information to transition to [name] I tried numerous times to explain this to the patient but he is belligerent and does not seem to want to listen to what I have to say Streptococcus bacteremia Sepsis physiology resolved 12/2018 Gentamicin to end 01/20/2021 Penicillin to continue for another month 02/19/2021? Defer to pharmacy adjustments of the same Paroxysmal A. fib CHADS2 score >4 Currently in sinus rhythm continuing Eliquis   DVT prophylaxis: Eliquis Code Status: Full Family Communication: None present Disposition:  Status is: Inpatient  Remains inpatient appropriate  because:Hemodynamically unstable, Ongoing active pain requiring inpatient pain management and Altered mental status   Dispo: The patient is from: Home              Anticipated d/c is to: SNF              Patient currently is medically stable to d/c.   Difficult to place patient No       Consultants:   none  Significant events: EGD on 01/06/2019 due to shortness of gastritis, duodenal ulcer but no evidence of bleeding. Status post 2 units packed red blood cells 01/08/2019 orthopantogram multiple dental caries with residual dentition lucency within the maxilla related to absent tooth 10 which may represent an residual periapical abscess. 01/06/2021 2D echocardiogram showed poor acoustic window EF of 55 MV bioprosthetic valve not well visualized with a mean gradient of 9 mmHg.  AV bioprosthetic valve not well visualized with a mean gradient of 30 mmHg  Patient is medically stable awaiting inpatient rehab placement. Antibiotics: 3.21.2022 rocephin 3.21.2022 Gentamacin  3.22.2022 PCN  Microbiology data: 01/04/2021 blood culture: Streptococcus Gondi 01/07/2021 blood cultures negative till date.  Procedures: 01/08/2021 TEE no vegetation  Subjective: Awake coherent no distress Quite disgruntled and has numerous flippant remarks regarding his care Our interaction was quite brief  Objective: Vitals:   01/16/21 0333 01/16/21 0806 01/16/21 1121 01/16/21 1515  BP: 108/64 (!) 92/55 102/62 (!) 96/58  Pulse: 72 80 75 72  Resp: 18 16 20 18   Temp: 98.1 F (36.7 C) 98 F (36.7 C) 98.1 F (36.7 C) 98.3 F (36.8 C)  TempSrc: Oral Axillary Oral Oral  SpO2: 97% 97% 93% 98%  Weight: 77.3 kg     Height:        Intake/Output Summary (Last 24 hours) at  01/16/2021 1711 Last data filed at 01/16/2021 1516 Gross per 24 hour  Intake 150 ml  Output 2075 ml  Net -1925 ml   Filed Weights   01/14/21 0333 01/15/21 0322 01/16/21 0333  Weight: 76.8 kg 76.5 kg 77.3 kg    Examination:  EOMI  NCAT anicteric no pallor Chest clear Abdomen soft No lower extremity edema  Data Reviewed: personally reviewed   CBC    Component Value Date/Time   WBC 4.5 01/12/2021 0213   RBC 2.79 (L) 01/12/2021 0213   HGB 9.4 (L) 01/12/2021 0213   HCT 28.3 (L) 01/12/2021 0213   PLT 172 01/12/2021 0213   MCV 101.4 (H) 01/12/2021 0213   MCH 33.7 01/12/2021 0213   MCHC 33.2 01/12/2021 0213   RDW 16.2 (H) 01/12/2021 0213   CMP Latest Ref Rng & Units 01/15/2021 01/12/2021 01/10/2021  Glucose 70 - 99 mg/dL 91 88 96  BUN 8 - 23 mg/dL 9 9 9   Creatinine 0.61 - 1.24 mg/dL 1.05 1.14 1.11  Sodium 135 - 145 mmol/L 136 136 137  Potassium 3.5 - 5.1 mmol/L 4.1 4.2 4.8  Chloride 98 - 111 mmol/L 102 102 107  CO2 22 - 32 mmol/L 27 28 27   Calcium 8.9 - 10.3 mg/dL 8.9 8.6(L) 8.3(L)  Total Protein 6.5 - 8.1 g/dL - - -  Total Bilirubin 0.3 - 1.2 mg/dL - - -  Alkaline Phos 38 - 126 U/L - - -  AST 15 - 41 U/L - - -  ALT 0 - 44 U/L - - -     Radiology Studies: No results found.   Scheduled Meds: . sodium chloride   Intravenous Once  . apixaban  5 mg Oral BID  . Chlorhexidine Gluconate Cloth  6 each Topical Daily  . levothyroxine  25 mcg Oral q AM  . melatonin  5 mg Oral QHS  . metoprolol succinate  50 mg Oral Daily  . pantoprazole  40 mg Oral BID  . potassium chloride  10 mEq Oral Daily   Continuous Infusions: . sodium chloride 10 mL/hr at 01/08/21 1039  . gentamicin 200 mg (01/15/21 2257)  . penicillin g continuous IV infusion 12 Million Units (01/16/21 1497)     LOS: 13 days   Time spent: 15 minutes  Nita Sells, MD [facility] To contact the attending provider between 7A-7P or the covering provider during after hours 7P-7A, please log into the web site [email or web address] and access using universal [place] password for that web site. If you do not have the password, please call the hospital operator.  01/16/2021, 5:11 PM

## 2021-01-16 NOTE — Plan of Care (Signed)
  Problem: Education: Goal: Knowledge of secondary prevention will improve Outcome: Progressing Goal: Knowledge of patient specific risk factors addressed and post discharge goals established will improve Outcome: Progressing   Problem: Education: Goal: Knowledge of General Education information will improve Description: Including pain rating scale, medication(s)/side effects and non-pharmacologic comfort measures Outcome: Progressing   Problem: Health Behavior/Discharge Planning: Goal: Ability to manage health-related needs will improve Outcome: Progressing   Problem: Clinical Measurements: Goal: Ability to maintain clinical measurements within normal limits will improve Outcome: Progressing Goal: Will remain free from infection Outcome: Progressing Goal: Diagnostic test results will improve Outcome: Progressing Goal: Respiratory complications will improve Outcome: Progressing Goal: Cardiovascular complication will be avoided Outcome: Progressing   Problem: Activity: Goal: Risk for activity intolerance will decrease Outcome: Progressing   Problem: Nutrition: Goal: Adequate nutrition will be maintained Outcome: Progressing   Problem: Coping: Goal: Level of anxiety will decrease Outcome: Progressing   Problem: Elimination: Goal: Will not experience complications related to bowel motility Outcome: Progressing Goal: Will not experience complications related to urinary retention Outcome: Progressing   Problem: Pain Managment: Goal: General experience of comfort will improve Outcome: Progressing   Problem: Safety: Goal: Ability to remain free from injury will improve Outcome: Progressing   Problem: Skin Integrity: Goal: Risk for impaired skin integrity will decrease Outcome: Progressing

## 2021-01-16 NOTE — Progress Notes (Signed)
Pharmacy Antibiotic Note  Donald Harrington is a 65 y.o. male admitted on 01/03/2021 with strokes  Pharmacy has been consulted for gentamicin dosing.  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 Will monitor trough levels, goal <1.   Trough on 3/24 was <0.5 and at goal for synergy dosing. SCr stable - no need for adjustments at this time. Planned duration through 4/4.   Plan: Cont Gentamicin 200mg  IV q24h - stop 4/4 Penicillin 12 million units q12h Continue to monitor Scr Re-check gent trough as needed  Height: 5' 10.5" (179.1 cm) Weight: 77.3 kg (170 lb 6.7 oz) IBW/kg (Calculated) : 74.15  Temp (24hrs), Avg:98.4 F (36.9 C), Min:98 F (36.7 C), Max:99.1 F (37.3 C)  Recent Labs  Lab 01/09/21 2206 01/10/21 0325 01/11/21 0321 01/12/21 0213 01/15/21 1119  WBC  --  4.9 4.6 4.5  --   CREATININE  --  1.11  --  1.14 1.05  GENTTROUGH <0.5*  --   --   --   --     Estimated Creatinine Clearance: 72.6 mL/min (by C-G formula based on SCr of 1.05 mg/dL).    No Known Allergies  Antimicrobials this admission: Ceftriaxone 3/20 >>3/22 Penicillin 3/22>> Gentamicin 3/21>>  Microbiology results: 3/19 BCx: strep gordonii  Thank you for allowing pharmacy to be a part of this patient's care.  Alycia Rossetti, PharmD, BCPS Clinical Pharmacist Clinical phone for 01/16/2021: [phone] 01/16/2021 10:59 AM   **Pharmacist phone directory can now be found on [email or web address] ([email or web address]).  Listed under [place].

## 2021-01-17 LAB — COMPREHENSIVE METABOLIC PANEL
ALT: 13 U/L (ref 0–44)
AST: 14 U/L — ABNORMAL LOW (ref 15–41)
Albumin: 2.5 g/dL — ABNORMAL LOW (ref 3.5–5.0)
Alkaline Phosphatase: 50 U/L (ref 38–126)
Anion gap: 7 (ref 5–15)
BUN: 15 mg/dL (ref 8–23)
CO2: 28 mmol/L (ref 22–32)
Calcium: 9 mg/dL (ref 8.9–10.3)
Chloride: 103 mmol/L (ref 98–111)
Creatinine, Ser: 1.12 mg/dL (ref 0.61–1.24)
GFR, Estimated: >60 mL/min (ref >60)
Glucose, Bld: 92 mg/dL (ref 70–99)
Potassium: 4 mmol/L (ref 3.5–5.1)
Sodium: 138 mmol/L (ref 135–145)
Total Bilirubin: 0.5 mg/dL (ref 0.3–1.2)
Total Protein: 6 g/dL — ABNORMAL LOW (ref 6.5–8.1)

## 2021-01-17 LAB — CBC WITH DIFFERENTIAL/PLATELET
Abs Immature Granulocytes: 0.01 10*3/uL (ref 0.00–0.07)
Basophils Absolute: 0 10*3/uL (ref 0.0–0.1)
Basophils Relative: 1 %
Eosinophils Absolute: 0.1 10*3/uL (ref 0.0–0.5)
Eosinophils Relative: 2 %
HCT: 29.5 % — ABNORMAL LOW (ref 39.0–52.0)
Hemoglobin: 9.6 g/dL — ABNORMAL LOW (ref 13.0–17.0)
Immature Granulocytes: 0 %
Lymphocytes Relative: 18 %
Lymphs Abs: 0.8 10*3/uL (ref 0.7–4.0)
MCH: 33.4 pg (ref 26.0–34.0)
MCHC: 32.5 g/dL (ref 30.0–36.0)
MCV: 102.8 fL — ABNORMAL HIGH (ref 80.0–100.0)
Monocytes Absolute: 0.3 10*3/uL (ref 0.1–1.0)
Monocytes Relative: 7 %
Neutro Abs: 3.1 10*3/uL (ref 1.7–7.7)
Neutrophils Relative %: 72 %
Platelets: 189 10*3/uL (ref 150–400)
RBC: 2.87 MIL/uL — ABNORMAL LOW (ref 4.22–5.81)
RDW: 15.3 % (ref 11.5–15.5)
WBC: 4.3 10*3/uL (ref 4.0–10.5)
nRBC: 0 % (ref 0.0–0.2)

## 2021-01-17 NOTE — Progress Notes (Signed)
PROGRESS NOTE   Pal Shell  UQJ:[id number] DOB: 03/15/1956 DOA: 01/03/2021 PCP: Patient, No Pcp Per (Inactive) patient followed chronically by Dr. 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 fib status post cardioversion 05/2014  And NICM, dyslipidemia, continued smoker Patient has been maintained on warfarin mainly because of atrial arrhythmias Echo 2020 done at cardiology office showed preserved LVEF normally functioning prosthetic valves He also is supposed to be seen for bladder cancer surgery by his urologist  He came in with multifocal strokes in the setting of taking Goody powders for neck pain found to have tachycardia and blood culture showed Streptococcus   Patient has continuously exhibited belligerent behavior refuses to comply with medical care and poor understanding of his medical condition and wishes to go home however he is not a safe discharge home and does need IV antibiotics for the circumscribed period of time as noted below His disposition is hampered by the fact that he does not have a skilled bed as yet      Hospital-Problem based course  Acute blood loss anemia upper GI bleed Received 2 units of PRBC this admission hemoglobin is stabilized Acute multifocal strokes Secondary to subtherapeutic INR I discussed the case with [name] cardiology after unsuccessfu lattempt at discussion with her cardiologist at [facility] cardiology [name] Patient has dual indication for anticoagulation CVA and paroxysmal A. fib as he was subtherapeutic I feel it is reasonable based on [name] information to transition to [place] Patient continues to be belligerent and does not seem to want to listen to what I have to say Streptococcus bacteremia Sepsis physiology resolved 12/2018 Gentamicin to end  01/20/2021 Penicillin to continue for another month 02/19/2021? Defer to pharmacy adjustments of the same Paroxysmal A. fib CHADS2 score >4 Currently in sinus rhythm continuing Eliquis   DVT prophylaxis: Eliquis Code Status: Full Family Communication: None present Disposition:  Status is: Inpatient  Remains inpatient appropriate because:Hemodynamically unstable, Ongoing active pain requiring inpatient pain management and Altered mental status   Dispo: The patient is from: Home              Anticipated d/c is to: SNF              Patient currently is medically stable to d/c.   Difficult to place patient No       Consultants:   none  Significant events: EGD on 01/06/2019 due to shortness of gastritis, duodenal ulcer but no evidence of bleeding. Status post 2 units packed red blood cells 01/08/2019 orthopantogram multiple dental caries with residual dentition lucency within the maxilla related to absent tooth 10 which may represent an residual periapical abscess. 01/06/2021 2D echocardiogram showed poor acoustic window EF of 55 MV bioprosthetic valve not well visualized with a mean gradient of 9 mmHg.  AV bioprosthetic valve not well visualized with a mean gradient of 30 mmHg  Patient is medically stable awaiting inpatient rehab placement. Antibiotics: 3.21.2022 rocephin 3.21.2022 Gentamacin  3.22.2022 PCN  Microbiology data: 01/04/2021 blood culture: Streptococcus Gondi 01/07/2021 blood cultures negative till date.  Procedures: 01/08/2021 TEE no vegetation  Subjective:  Continues to be disgruntled and argumentative Our interaction was very brief  Objective: Vitals:   01/17/21 0942 01/17/21 1107 01/17/21 1156 01/17/21 1500  BP: 101/70 107/83 114/65 113/64  Pulse: 81 95 72 76  Resp:  20 18 20   Temp:  98.3 F (  36.8 C)  98.3 F (36.8 C)  TempSrc:  Axillary  Oral  SpO2:  94% 94% 96%  Weight:      Height:        Intake/Output Summary (Last 24 hours) at 01/17/2021  1658 Last data filed at 01/17/2021 1300 Gross per 24 hour  Intake 451 ml  Output 1400 ml  Net -949 ml   Filed Weights   01/15/21 0322 01/16/21 0333 01/17/21 0442  Weight: 76.5 kg 77.3 kg 75.1 kg    Examination:  No icterus no pallor Chest clear S1-S2  murmur noted  Data Reviewed: personally reviewed   CBC    Component Value Date/Time   WBC 4.3 01/17/2021 0943   RBC 2.87 (L) 01/17/2021 0943   HGB 9.6 (L) 01/17/2021 0943   HCT 29.5 (L) 01/17/2021 0943   PLT 189 01/17/2021 0943   MCV 102.8 (H) 01/17/2021 0943   MCH 33.4 01/17/2021 0943   MCHC 32.5 01/17/2021 0943   RDW 15.3 01/17/2021 0943   LYMPHSABS 0.8 01/17/2021 0943   MONOABS 0.3 01/17/2021 0943   EOSABS 0.1 01/17/2021 0943   BASOSABS 0.0 01/17/2021 0943   CMP Latest Ref Rng & Units 01/17/2021 01/15/2021 01/12/2021  Glucose 70 - 99 mg/dL 92 91 88  BUN 8 - 23 mg/dL 15 9 9   Creatinine 0.61 - 1.24 mg/dL 1.12 1.05 1.14  Sodium 135 - 145 mmol/L 138 136 136  Potassium 3.5 - 5.1 mmol/L 4.0 4.1 4.2  Chloride 98 - 111 mmol/L 103 102 102  CO2 22 - 32 mmol/L 28 27 28   Calcium 8.9 - 10.3 mg/dL 9.0 8.9 8.6(L)  Total Protein 6.5 - 8.1 g/dL 6.0(L) - -  Total Bilirubin 0.3 - 1.2 mg/dL 0.5 - -  Alkaline Phos 38 - 126 U/L 50 - -  AST 15 - 41 U/L 14(L) - -  ALT 0 - 44 U/L 13 - -     Radiology Studies: No results found.   Scheduled Meds: . sodium chloride   Intravenous Once  . apixaban  5 mg Oral BID  . Chlorhexidine Gluconate Cloth  6 each Topical Daily  . levothyroxine  25 mcg Oral q AM  . melatonin  5 mg Oral QHS  . metoprolol succinate  50 mg Oral Daily  . pantoprazole  40 mg Oral BID  . potassium chloride  10 mEq Oral Daily   Continuous Infusions: . sodium chloride 10 mL/hr at 01/08/21 1039  . gentamicin Stopped (01/17/21 0112)  . penicillin g continuous IV infusion 12 Million Units (01/17/21 0502)     LOS: 14 days   Time spent: 15 minutes  Nita Sells, MD [facility] To contact the  attending provider between 7A-7P or the covering provider during after hours 7P-7A, please log into the web site [email or web address] and access using universal [place] password for that web site. If you do not have the password, please call the hospital operator.  01/17/2021, 4:58 PM

## 2021-01-17 NOTE — TOC Progression Note (Signed)
Transition of Care [facility]) - Progression Note    Patient Details  Name: Donald Harrington MRN: [id number] Date of Birth: 10/24/1955  [facility] CM/SW Contact  Pollie Friar, RN Phone Number: 01/17/2021, 1:21 PM  Clinical Narrative:    CM received information from [facility] that [facility] has denied his SNF rehab. CM called Heather with [employer] [phone]) to see about a peer to peer and she states that this is not offered at this time and he has to do an expedited appeal. CM called the number Nira Conn provided for the appeals [phone] and provided the needed information and requested expedited appeal. CM has also faxed in the information to: [phone] per request. CM will update the MD.   Expected Discharge Plan: Skilled Nursing Facility Barriers to Discharge: Continued Medical Work up  Expected Discharge Plan and Services Expected Discharge Plan: [place] In-house Referral: Clinical Social Work Discharge Planning Services: CM Consult Post Acute Care Choice: [place] Living arrangements for the past 2 months: Apartment Expected Discharge Date: 01/13/21                                     Social Determinants of Health (SDOH) Interventions    Readmission Risk Interventions No flowsheet data found.

## 2021-01-17 NOTE — Progress Notes (Signed)
OT Cancellation Note  Patient Details Name: Donald Harrington MRN: [id number] DOB: 01/10/1956   Cancelled Treatment:    Reason Eval/Treat Not Completed: Other (comment). Pt presenting more frustrated today. Pt verbalizing "I am learning more about you people and your Joanna." Verbalizing he feels that we are holding him here for his money and he would be better off in jail. Asking pt calmly if he would like to walk, and he said "no, thank you." Will return as schedule allows.   [place], OTR/L Acute Rehab Pager: [phone] Office: [phone] 01/17/2021, 11:35 AM

## 2021-01-17 NOTE — Progress Notes (Signed)
PT Cancellation Note  Patient Details Name: Donald Harrington MRN: [id number] DOB: 1956/09/04   Cancelled Treatment:    Reason Eval/Treat Not Completed: Patient declined, no reason specified Patient adamantly refused therapy session stating "I'm not leaving my stuff unattended in this room after seeing these people that are living up here now." Educated patient about room privacy and no one would touch his belongings. Patient refused again and stated "I'm not leaving my stuff. Bye!" PT will re-attempt as time allows.   Keamber Macfadden A. Gilford Rile PT, DPT Acute Rehabilitation Services Pager [phone] Office [phone]    Linna Hoff 01/17/2021, 9:29 AM

## 2021-01-18 LAB — COMPREHENSIVE METABOLIC PANEL
ALT: 11 U/L (ref 0–44)
AST: 10 U/L — ABNORMAL LOW (ref 15–41)
Albumin: 2.4 g/dL — ABNORMAL LOW (ref 3.5–5.0)
Alkaline Phosphatase: 48 U/L (ref 38–126)
Anion gap: 6 (ref 5–15)
BUN: 16 mg/dL (ref 8–23)
CO2: 28 mmol/L (ref 22–32)
Calcium: 8.7 mg/dL — ABNORMAL LOW (ref 8.9–10.3)
Chloride: 101 mmol/L (ref 98–111)
Creatinine, Ser: 1.1 mg/dL (ref 0.61–1.24)
GFR, Estimated: >60 mL/min (ref >60)
Glucose, Bld: 97 mg/dL (ref 70–99)
Potassium: 3.9 mmol/L (ref 3.5–5.1)
Sodium: 135 mmol/L (ref 135–145)
Total Bilirubin: 0.6 mg/dL (ref 0.3–1.2)
Total Protein: 5.8 g/dL — ABNORMAL LOW (ref 6.5–8.1)

## 2021-01-18 LAB — CBC WITH DIFFERENTIAL/PLATELET
Abs Immature Granulocytes: 0.02 10*3/uL (ref 0.00–0.07)
Basophils Absolute: 0 10*3/uL (ref 0.0–0.1)
Basophils Relative: 0 %
Eosinophils Absolute: 0.1 10*3/uL (ref 0.0–0.5)
Eosinophils Relative: 2 %
HCT: 27.9 % — ABNORMAL LOW (ref 39.0–52.0)
Hemoglobin: 9.2 g/dL — ABNORMAL LOW (ref 13.0–17.0)
Immature Granulocytes: 0 %
Lymphocytes Relative: 21 %
Lymphs Abs: 1 10*3/uL (ref 0.7–4.0)
MCH: 34.1 pg — ABNORMAL HIGH (ref 26.0–34.0)
MCHC: 33 g/dL (ref 30.0–36.0)
MCV: 103.3 fL — ABNORMAL HIGH (ref 80.0–100.0)
Monocytes Absolute: 0.4 10*3/uL (ref 0.1–1.0)
Monocytes Relative: 8 %
Neutro Abs: 3.2 10*3/uL (ref 1.7–7.7)
Neutrophils Relative %: 69 %
Platelets: 195 10*3/uL (ref 150–400)
RBC: 2.7 MIL/uL — ABNORMAL LOW (ref 4.22–5.81)
RDW: 15.2 % (ref 11.5–15.5)
WBC: 4.7 10*3/uL (ref 4.0–10.5)
nRBC: 0 % (ref 0.0–0.2)

## 2021-01-18 NOTE — Progress Notes (Signed)
PROGRESS NOTE   Donald Harrington  [id number]:[id number] DOB: [birth year] DOA: 01/03/2021 PCP: Patient, No Pcp Per (Inactive) patient followed chronically by Dr. 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 fib status post cardioversion 05/2014  And NICM, dyslipidemia, continued smoker Patient has been maintained on warfarin mainly because of atrial arrhythmias Echo 2020 done at cardiology office showed preserved LVEF normally functioning prosthetic valves He also is supposed to be seen for bladder cancer surgery by his urologist  He came in with multifocal strokes in the setting of taking Goody powders for neck pain found to have tachycardia and blood culture showed Streptococcus   Patient has continuously exhibited  poor understanding of his medical condition and wishes to go home however he is not a safe discharge home and does need IV antibiotics for the circumscribed period of time as noted below His disposition is hampered by the fact that he does not have a skilled bed as yet   Hospital-Problem based course  Acute blood loss anemia upper GI bleed Received 2 units of PRBC this admission hemoglobin is stabilized Acute multifocal strokes Secondary to subtherapeutic INR D/w [name] cardiology after unsuccessfu lattempt at discussion with her cardiologist at [facility] cardiology [name] Patient has dual indication for anticoagulation CVA and paroxysmal A. fib as he was subtherapeutic I feel it is reasonable based on [name] information to transition to [place] Streptococcus bacteremia Sepsis physiology resolved 12/2018 Gentamicin to end 01/20/2021 Penicillin to continue for another month 02/19/2021? Defer to pharmacy adjustments of the same Paroxysmal A. fib CHADS2 score >4 Currently in sinus rhythm continuing Eliquis   DVT  prophylaxis: Eliquis Code Status: Full Family Communication: None present Disposition:  Status is: Inpatient  Remains inpatient appropriate because:Hemodynamically unstable, Ongoing active pain requiring inpatient pain management and Altered mental status   Dispo: The patient is from: Home              Anticipated d/c is to: SNF              Patient currently is medically stable to d/c.   Difficult to place patient No   Consultants:   none  Significant events: EGD on 01/06/2019 due to shortness of gastritis, duodenal ulcer but no evidence of bleeding. Status post 2 units packed red blood cells 01/08/2019 orthopantogram multiple dental caries with residual dentition lucency within the maxilla related to absent tooth 10 which may represent an residual periapical abscess. 01/06/2021 2D echocardiogram showed poor acoustic window EF of 55 MV bioprosthetic valve not well visualized with a mean gradient of 9 mmHg.  AV bioprosthetic valve not well visualized with a mean gradient of 30 mmHg  Patient is medically stable awaiting inpatient rehab placement. Antibiotics: 3.21.2022 rocephin 3.21.2022 Gentamacin  3.22.2022 PCN  Microbiology data: 01/04/2021 blood culture: Streptococcus Gondi 01/07/2021 blood cultures negative till date.  Procedures: 01/08/2021 TEE no vegetation  Subjective:  Patient with soft stool and tinge of blood--he declines my exam and tells me he doesn't have hemorrhoids Today he is focused on his 65 yr old BUick still parked at [employer] [?] I offered to call his "friedn" who was supposed to pick up his car--he declines He seems to have trouble keeping his train of thought--couldn't tell me what he ate today and got off-track  Objective: Vitals:   01/18/21 0722 01/18/21 1221 01/18/21 1310 01/18/21 1518  BP: 100/71 109/67  Marland Kitchen)  94/51  Pulse: 76 71  68  Resp: 20 18  20   Temp: 98.7 F (37.1 C) 100.3 F (37.9 C) 98.9 F (37.2 C) 98.9 F (37.2 C)   TempSrc: Oral Oral Oral Oral  SpO2: 93% 94%  94%  Weight:      Height:        Intake/Output Summary (Last 24 hours) at 01/18/2021 1813 Last data filed at 01/18/2021 1300 Gross per 24 hour  Intake 770 ml  Output 1800 ml  Net -1030 ml   Filed Weights   01/15/21 0322 01/16/21 0333 01/17/21 0442  Weight: 76.5 kg 77.3 kg 75.1 kg    Examination:  No icterus no pallor Chest clear S1-S2  murmur noted abd soft no rebound-rectal deferred  Data Reviewed: personally reviewed   CBC    Component Value Date/Time   WBC 4.7 01/18/2021 0305   RBC 2.70 (L) 01/18/2021 0305   HGB 9.2 (L) 01/18/2021 0305   HCT 27.9 (L) 01/18/2021 0305   PLT 195 01/18/2021 0305   MCV 103.3 (H) 01/18/2021 0305   MCH 34.1 (H) 01/18/2021 0305   MCHC 33.0 01/18/2021 0305   RDW 15.2 01/18/2021 0305   LYMPHSABS 1.0 01/18/2021 0305   MONOABS 0.4 01/18/2021 0305   EOSABS 0.1 01/18/2021 0305   BASOSABS 0.0 01/18/2021 0305   CMP Latest Ref Rng & Units 01/18/2021 01/17/2021 01/15/2021  Glucose 70 - 99 mg/dL 97 92 91  BUN 8 - 23 mg/dL 16 15 9   Creatinine 0.61 - 1.24 mg/dL 1.10 1.12 1.05  Sodium 135 - 145 mmol/L 135 138 136  Potassium 3.5 - 5.1 mmol/L 3.9 4.0 4.1  Chloride 98 - 111 mmol/L 101 103 102  CO2 22 - 32 mmol/L 28 28 27   Calcium 8.9 - 10.3 mg/dL 8.7(L) 9.0 8.9  Total Protein 6.5 - 8.1 g/dL 5.8(L) 6.0(L) -  Total Bilirubin 0.3 - 1.2 mg/dL 0.6 0.5 -  Alkaline Phos 38 - 126 U/L 48 50 -  AST 15 - 41 U/L 10(L) 14(L) -  ALT 0 - 44 U/L 11 13 -     Radiology Studies: No results found.   Scheduled Meds: . sodium chloride   Intravenous Once  . apixaban  5 mg Oral BID  . Chlorhexidine Gluconate Cloth  6 each Topical Daily  . levothyroxine  25 mcg Oral q AM  . melatonin  5 mg Oral QHS  . metoprolol succinate  50 mg Oral Daily  . pantoprazole  40 mg Oral BID  . potassium chloride  10 mEq Oral Daily   Continuous Infusions: . sodium chloride 10 mL/hr at 01/08/21 1039  . gentamicin Stopped (01/17/21 2144)   . penicillin g continuous IV infusion 12 Million Units (01/18/21 1719)     LOS: 15 days   Time spent: 15 minutes  Nita Sells, MD [facility] To contact the attending provider between 7A-7P or the covering provider during after hours 7P-7A, please log into the web site [email or web address] and access using universal [place] password for that web site. If you do not have the password, please call the hospital operator.  01/18/2021, 6:13 PM

## 2021-01-18 NOTE — Plan of Care (Signed)
  Problem: Education: Goal: Knowledge of secondary prevention will improve Outcome: Progressing Goal: Knowledge of patient specific risk factors addressed and post discharge goals established will improve Outcome: Progressing   Problem: Education: Goal: Knowledge of General Education information will improve Description: Including pain rating scale, medication(s)/side effects and non-pharmacologic comfort measures Outcome: Progressing   Problem: Health Behavior/Discharge Planning: Goal: Ability to manage health-related needs will improve Outcome: Progressing   Problem: Clinical Measurements: Goal: Ability to maintain clinical measurements within normal limits will improve Outcome: Progressing Goal: Will remain free from infection Outcome: Progressing Goal: Diagnostic test results will improve Outcome: Progressing Goal: Respiratory complications will improve Outcome: Progressing Goal: Cardiovascular complication will be avoided Outcome: Progressing   Problem: Activity: Goal: Risk for activity intolerance will decrease Outcome: Progressing   Problem: Nutrition: Goal: Adequate nutrition will be maintained Outcome: Progressing   Problem: Coping: Goal: Level of anxiety will decrease Outcome: Progressing   Problem: Elimination: Goal: Will not experience complications related to bowel motility Outcome: Progressing Goal: Will not experience complications related to urinary retention Outcome: Progressing   Problem: Pain Managment: Goal: General experience of comfort will improve Outcome: Progressing   Problem: Safety: Goal: Ability to remain free from injury will improve Outcome: Progressing   Problem: Skin Integrity: Goal: Risk for impaired skin integrity will decrease Outcome: Progressing

## 2021-01-18 NOTE — Progress Notes (Signed)
Patient becoming more belligerent as the night progresses. Patient states that, "I haven't been given that tramadol all day. Look it up and see when they gave it to me last." This RN explained to patient that when he c/o of a headache he is likely being given Tylenol because the Tramadol is intended for more severe pain. The patient then proceeds to tell this nurse to "Get out of my room and leave me alone to die like y'all want." This RN made several attempts to deescalate the situation, but patient continued to yell and throw his arms. Patient requested that he be left alone. Charge RN made aware.

## 2021-01-18 NOTE — Progress Notes (Signed)
Paged Dr Verlon Au to notify of SBP of 94.  Pt is resting and asymptomatic without complaints.  Said he feels fine.  MD said no orders at this time.

## 2021-01-19 LAB — COMPREHENSIVE METABOLIC PANEL
ALT: 10 U/L (ref 0–44)
AST: 10 U/L — ABNORMAL LOW (ref 15–41)
Albumin: 2.4 g/dL — ABNORMAL LOW (ref 3.5–5.0)
Alkaline Phosphatase: 42 U/L (ref 38–126)
Anion gap: 7 (ref 5–15)
BUN: 14 mg/dL (ref 8–23)
CO2: 27 mmol/L (ref 22–32)
Calcium: 8.7 mg/dL — ABNORMAL LOW (ref 8.9–10.3)
Chloride: 101 mmol/L (ref 98–111)
Creatinine, Ser: 1.15 mg/dL (ref 0.61–1.24)
GFR, Estimated: >60 mL/min (ref >60)
Glucose, Bld: 93 mg/dL (ref 70–99)
Potassium: 3.8 mmol/L (ref 3.5–5.1)
Sodium: 135 mmol/L (ref 135–145)
Total Bilirubin: 0.6 mg/dL (ref 0.3–1.2)
Total Protein: 5.8 g/dL — ABNORMAL LOW (ref 6.5–8.1)

## 2021-01-19 LAB — CBC WITH DIFFERENTIAL/PLATELET
Abs Immature Granulocytes: 0.01 10*3/uL (ref 0.00–0.07)
Basophils Absolute: 0 10*3/uL (ref 0.0–0.1)
Basophils Relative: 1 %
Eosinophils Absolute: 0.1 10*3/uL (ref 0.0–0.5)
Eosinophils Relative: 2 %
HCT: 28.2 % — ABNORMAL LOW (ref 39.0–52.0)
Hemoglobin: 9.2 g/dL — ABNORMAL LOW (ref 13.0–17.0)
Immature Granulocytes: 0 %
Lymphocytes Relative: 21 %
Lymphs Abs: 0.9 10*3/uL (ref 0.7–4.0)
MCH: 33.1 pg (ref 26.0–34.0)
MCHC: 32.6 g/dL (ref 30.0–36.0)
MCV: 101.4 fL — ABNORMAL HIGH (ref 80.0–100.0)
Monocytes Absolute: 0.4 10*3/uL (ref 0.1–1.0)
Monocytes Relative: 9 %
Neutro Abs: 3 10*3/uL (ref 1.7–7.7)
Neutrophils Relative %: 67 %
Platelets: 191 10*3/uL (ref 150–400)
RBC: 2.78 MIL/uL — ABNORMAL LOW (ref 4.22–5.81)
RDW: 15 % (ref 11.5–15.5)
WBC: 4.5 10*3/uL (ref 4.0–10.5)
nRBC: 0 % (ref 0.0–0.2)

## 2021-01-19 NOTE — Progress Notes (Signed)
PROGRESS NOTE   Donald Harrington  ZOX:[id number] DOB: Jul 16, 1956 DOA: 01/03/2021 PCP: Patient, No Pcp Per (Inactive) patient followed chronically by Dr. 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 fib status post cardioversion 05/2014  And NICM, dyslipidemia, continued smoker Patient has been maintained on warfarin mainly because of atrial arrhythmias Echo 2020 done at cardiology office showed preserved LVEF normally functioning prosthetic valves He also is supposed to be seen for bladder cancer surgery by his urologist  He came in with multifocal strokes in the setting of taking Goody powders for neck pain found to have tachycardia and blood culture showed Streptococcus   Patient has continuously exhibited  poor understanding of his medical condition and wishes to go home however he is not a safe discharge home and does need IV antibiotics for the circumscribed period of time as noted below His disposition is hampered by the fact that he does not have a skilled bed as yet   Hospital-Problem based course  Acute blood loss anemia upper GI bleed based on endoscopy 3/20 Received 2 units of PRBC this admission hemoglobin is stabilized Acute multifocal strokes Secondary to subtherapeutic INR-patient initially resistant to use of DOAC therefore D/w [name] cardiology 3/31 Patient has dual indication for anticoagulation CVA and paroxysmal A. fib as he was subtherapeutic I feel it is reasonable based on [name] information to transition to [place] Streptococcus bacteremia Sepsis physiology resolved 12/2018 Gentamicin stop date 01/20/2021 Penicillin stop date 02/17/2021 Defer to pharmacy adjustments of the same Paroxysmal A. fib CHADS2 score >4 Currently in sinus rhythm continuing Eliquis   DVT prophylaxis: Eliquis Code Status:  Full Family Communication: None present Disposition:  Status is: Inpatient  Remains inpatient appropriate because:Unsafe d/c plan-he needs to go to a skilled facility to complete his antibiotics   Dispo: The patient is from: Home              Anticipated d/c is to: SNF-awaiting approval for skilled facility              Patient currently is medically stable to d/c.   Difficult to place patient No   Consultants:   none  Significant events: EGD on 01/06/2019 due to shortness of gastritis, duodenal ulcer but no evidence of bleeding. Status post 2 units packed red blood cells 01/08/2019 orthopantogram multiple dental caries with residual dentition lucency within the maxilla related to absent tooth 10 which may represent an residual periapical abscess. 01/06/2021 2D echocardiogram showed poor acoustic window EF of 55 MV bioprosthetic valve not well visualized with a mean gradient of 9 mmHg.  AV bioprosthetic valve not well visualized with a mean gradient of 30 mmHg  Patient is medically stable awaiting inpatient rehab placement. Antibiotics: 3.21.2022 rocephin 3.21.2022 Gentamacin  3.22.2022 PCN  Microbiology data: 01/04/2021 blood culture: Streptococcus Gondi 01/07/2021 blood cultures negative till date.  Procedures: 01/08/2021 TEE no vegetation  Subjective:  In much better spirits today not antagonistic talks to me with a great relish about what he likes to eat Seems to like sweets in addition to banana sandwiches He seems reconciled to taking DOAC and is in fact quite excited at the prospect of eating salads as much as he wants  Objective: Vitals:   01/18/21 2339 01/19/21 0344 01/19/21 0727 01/19/21 1130  BP: (!) 91/55 104/68 127/79 120/75  Pulse: 68 70 76 78  Resp: 15 15 20  18  Temp: 99.3 F (37.4 C) 99.1 F (37.3 C) 98.3 F (36.8 C) 98.7 F (37.1 C)  TempSrc: Oral Oral Oral Oral  SpO2: 93% 93% 98% (!) 84%  Weight:      Height:        Intake/Output Summary (Last  24 hours) at 01/19/2021 1654 Last data filed at 01/19/2021 0936 Gross per 24 hour  Intake --  Output 1050 ml  Net -1050 ml   Filed Weights   01/15/21 0322 01/16/21 0333 01/17/21 0442  Weight: 76.5 kg 77.3 kg 75.1 kg    Examination:  Pleasant No icterus no pallor Chest clear Abdomen soft  S1-S2 murmur noted  Data Reviewed: personally reviewed   CBC    Component Value Date/Time   WBC 4.5 01/19/2021 0400   RBC 2.78 (L) 01/19/2021 0400   HGB 9.2 (L) 01/19/2021 0400   HCT 28.2 (L) 01/19/2021 0400   PLT 191 01/19/2021 0400   MCV 101.4 (H) 01/19/2021 0400   MCH 33.1 01/19/2021 0400   MCHC 32.6 01/19/2021 0400   RDW 15.0 01/19/2021 0400   LYMPHSABS 0.9 01/19/2021 0400   MONOABS 0.4 01/19/2021 0400   EOSABS 0.1 01/19/2021 0400   BASOSABS 0.0 01/19/2021 0400   CMP Latest Ref Rng & Units 01/19/2021 01/18/2021 01/17/2021  Glucose 70 - 99 mg/dL 93 97 92  BUN 8 - 23 mg/dL 14 16 15   Creatinine 0.61 - 1.24 mg/dL 1.15 1.10 1.12  Sodium 135 - 145 mmol/L 135 135 138  Potassium 3.5 - 5.1 mmol/L 3.8 3.9 4.0  Chloride 98 - 111 mmol/L 101 101 103  CO2 22 - 32 mmol/L 27 28 28   Calcium 8.9 - 10.3 mg/dL 8.7(L) 8.7(L) 9.0  Total Protein 6.5 - 8.1 g/dL 5.8(L) 5.8(L) 6.0(L)  Total Bilirubin 0.3 - 1.2 mg/dL 0.6 0.6 0.5  Alkaline Phos 38 - 126 U/L 42 48 50  AST 15 - 41 U/L 10(L) 10(L) 14(L)  ALT 0 - 44 U/L 10 11 13      Radiology Studies: No results found.   Scheduled Meds: . sodium chloride   Intravenous Once  . apixaban  5 mg Oral BID  . Chlorhexidine Gluconate Cloth  6 each Topical Daily  . levothyroxine  25 mcg Oral q AM  . melatonin  5 mg Oral QHS  . metoprolol succinate  50 mg Oral Daily  . pantoprazole  40 mg Oral BID  . potassium chloride  10 mEq Oral Daily   Continuous Infusions: . sodium chloride 10 mL/hr at 01/08/21 1039  . gentamicin 200 mg (01/18/21 2212)  . penicillin g continuous IV infusion 12 Million Units (01/19/21 0526)     LOS: 16 days   Time spent: 25  minutes  Nita Sells, MD [facility] To contact the attending provider between 7A-7P or the covering provider during after hours 7P-7A, please log into the web site [email or web address] and access using universal [place] password for that web site. If you do not have the password, please call the hospital operator.  01/19/2021, 4:54 PM

## 2021-01-19 NOTE — Plan of Care (Signed)
°  Problem: Clinical Measurements: °Goal: Ability to maintain clinical measurements within normal limits will improve °Outcome: Progressing °Goal: Will remain free from infection °Outcome: Progressing °Goal: Diagnostic test results will improve °Outcome: Progressing °Goal: Respiratory complications will improve °Outcome: Progressing °  °

## 2021-01-19 NOTE — Progress Notes (Signed)
This RN entered patient's room to ask him about his telemetry monitor. Patient explains it was taken off a couple of days ago. This RN attempts to place patient back on telemetry monitor and patient becomes verbally aggressive. Patient states, "Go ahead and hook me back up. Maybe it will *patient makes shocking sound* me and go ahead and kill me before y'all do." Patient then proceeds to argue that we are keeping him against his will. This RN proceeds to explain that he still has an active order for [place] to be monitoring his heart and that he needs to be placed back on the monitor until the doctor can d/c the order if that is what the find appropriate. Patient then states, "Have someone who makes decisions about these things come back." Central telemetry aware of patient refusing to be placed back on the monitor and day shift RN to report incident to provider.

## 2021-01-19 NOTE — Progress Notes (Signed)
Pharmacy Antibiotic Note  Donald Harrington is a 65 y.o. male admitted on 01/03/2021 with strokes  Pharmacy has been consulted for gentamicin dosing.  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 Will monitor trough levels, goal <1.   Trough on 3/24 was <0.5 and at goal for synergy dosing. SCr stable - no need for adjustments at this time. Planned duration through 4/4.   Repeat Bld cultures on 3/22 are negative  Afebrile, WBC wnl,  Renal function remains within normal, SCr 1.15.   Plan: Cont Gentamicin 200mg  IV q24h - stop 4/4 to complete 2 weeks for synergy. Penicillin 12 million units q12h (x 6 weeks) Continue to monitor Scr No re-check of gent trough as gent to end on 01/20/21.  Height: 5' 10.5" (179.1 cm) Weight: 75.1 kg (165 lb 9.1 oz) IBW/kg (Calculated) : 74.15  Temp (24hrs), Avg:98.8 F (37.1 C), Min:98.3 F (36.8 C), Max:99.3 F (37.4 C)  Recent Labs  Lab 01/15/21 1119 01/17/21 0943 01/18/21 0305 01/19/21 0400  WBC  --  4.3 4.7 4.5  CREATININE 1.05 1.12 1.10 1.15    Estimated Creatinine Clearance: 66.3 mL/min (by C-G formula based on SCr of 1.15 mg/dL).    No Known Allergies  Antimicrobials this admission: Ceftriaxone 3/20 >>3/22 Penicillin 3/22>> 02/17/21 Gentamicin 3/21>> 4/4   Microbiology results: 3/19 BCx>> 3/3 strep gordonii (pan-S EXCEPT R-erythromycin) 3/20 Cov/Flu: negative 3/22 BCx >> negative    Thank you for allowing pharmacy to be a part of this patient's care.  Nicole Cella, RPh Clinical Pharmacist Clinical phone for 01/19/2021: [phone] 01/19/2021 12:26 PM   **Pharmacist phone directory can now be found on [email or web address] ([email or web address]).  Listed under [place].

## 2021-01-20 ENCOUNTER — Inpatient Hospital Stay: Payer: Self-pay

## 2021-01-20 ENCOUNTER — Inpatient Hospital Stay (HOSPITAL_COMMUNITY): Payer: Medicare HMO

## 2021-01-20 LAB — RESP PANEL BY RT-PCR (FLU A&B, COVID) ARPGX2
Influenza A by PCR: NEGATIVE
Influenza B by PCR: NEGATIVE
SARS Coronavirus 2 by RT PCR: NEGATIVE

## 2021-01-20 LAB — RESPIRATORY PANEL BY PCR

## 2021-01-20 NOTE — Plan of Care (Signed)
  Problem: Education: Goal: Knowledge of secondary prevention will improve Outcome: Adequate for Discharge Goal: Knowledge of patient specific risk factors addressed and post discharge goals established will improve Outcome: Adequate for Discharge   Problem: Education: Goal: Knowledge of General Education information will improve Description: Including pain rating scale, medication(s)/side effects and non-pharmacologic comfort measures Outcome: Adequate for Discharge   Problem: Health Behavior/Discharge Planning: Goal: Ability to manage health-related needs will improve Outcome: Adequate for Discharge   Problem: Clinical Measurements: Goal: Ability to maintain clinical measurements within normal limits will improve Outcome: Adequate for Discharge Goal: Will remain free from infection Outcome: Adequate for Discharge Goal: Diagnostic test results will improve Outcome: Adequate for Discharge Goal: Respiratory complications will improve Outcome: Adequate for Discharge Goal: Cardiovascular complication will be avoided Outcome: Adequate for Discharge   Problem: Activity: Goal: Risk for activity intolerance will decrease Outcome: Adequate for Discharge   Problem: Nutrition: Goal: Adequate nutrition will be maintained Outcome: Adequate for Discharge   Problem: Coping: Goal: Level of anxiety will decrease Outcome: Adequate for Discharge   Problem: Elimination: Goal: Will not experience complications related to bowel motility Outcome: Adequate for Discharge Goal: Will not experience complications related to urinary retention Outcome: Adequate for Discharge   Problem: Pain Managment: Goal: General experience of comfort will improve Outcome: Adequate for Discharge   Problem: Safety: Goal: Ability to remain free from injury will improve Outcome: Adequate for Discharge   Problem: Skin Integrity: Goal: Risk for impaired skin integrity will decrease Outcome: Adequate for  Discharge

## 2021-01-20 NOTE — Progress Notes (Signed)
Picc catheter was found to have a kink under the dressing after RN was unable to clear patient side occluded alarm. Dressing was changed and catheter was straightened. Picc Rn advise to get xray to check for placement. Exposed catheter length is 5cm. Md informed

## 2021-01-20 NOTE — Progress Notes (Signed)
Peripherally Inserted Central Catheter Placement  The IV Nurse has discussed with the patient and/or persons authorized to consent for the patient, the purpose of this procedure and the potential benefits and risks involved with this procedure.  The benefits include less needle sticks, lab draws from the catheter, and the patient may be discharged home with the catheter. Risks include, but not limited to, infection, bleeding, blood clot (thrombus formation), and puncture of an artery; nerve damage and irregular heartbeat and possibility to perform a PICC exchange if needed/ordered by physician.  Alternatives to this procedure were also discussed.  Bard Power PICC patient education guide, fact sheet on infection prevention and patient information card has been provided to patient /or left at bedside.PICC exchanged by Rosalio Macadamia RN.     PICC Placement Documentation  PICC Single Lumen 01/20/21 PICC Right Brachial 38 cm 0 cm (Active)  Indication for Insertion or Continuance of Line Home intravenous therapies (PICC only) 01/20/21 2234  Exposed Catheter (cm) 0 cm 01/20/21 2234  Site Assessment Clean;Dry;Intact 01/20/21 2234  Line Status Blood return noted;Flushed;Saline locked 01/20/21 2234  Dressing Type Transparent;Occlusive;Securing device 01/20/21 2234  Dressing Status Clean;Dry;Intact 01/20/21 2234  Antimicrobial disc in place? Yes 01/20/21 2234  Safety Lock Not Applicable 74/45/14 6047  Line Adjustment (NICU/IV Team Only) No 01/20/21 2234  Dressing Intervention New dressing;Other (Comment) 01/20/21 2234  Dressing Change Due 01/27/21 01/20/21 2234       Edson Snowball 01/20/2021, 10:47 PM

## 2021-01-20 NOTE — TOC Transition Note (Signed)
Transition of Care [facility]) - CM/SW Discharge Note   Patient Details  Name: Donald Harrington MRN: [id number] Date of Birth: 1956/10/13  [facility] CM/SW Contact:  Geralynn Ochs, LCSW Phone Number: 01/20/2021, 4:13 PM   Clinical Narrative:   CSW notified that [facility] has reversed the denial, patient approved to admit to [facility] today. CSW spoke with Admissions at [facility] and confirmed that they could get the patient's IV antibiotics today for admission. CSW met with patient, who was upset about having to go to SNF, but agreeable to go.   Nurse to call report to [phone], Orlene Plum Room 104    Final next level of care: Skilled Nursing Facility Barriers to Discharge: Barriers Resolved   Patient Goals and CMS Choice   CMS Medicare.gov Compare Post Acute Care list provided to:: Patient Choice offered to / list presented to : Patient  Discharge Placement              Patient chooses bed at: [facility] Patient to be transferred to facility by: [place] Name of family member notified: Self Patient and family notified of of transfer: 01/20/21  Discharge Plan and Services In-house Referral: Clinical Social Work Discharge Planning Services: [employer] Consult Post Acute Care Choice: [place]                               Social Determinants of Health (SDOH) Interventions     Readmission Risk Interventions No flowsheet data found.

## 2021-01-20 NOTE — Progress Notes (Signed)
PT Cancellation Note  Patient Details Name: Donald Harrington MRN: [id number] DOB: November 11, 1955   Cancelled Treatment:    Reason Eval/Treat Not Completed: Patient declined, no reason specified. Pt appears very upset with PT's presence in his room this morning. Verbally aggressive, adamantly declining participation. Will continue to follow.    Thelma Comp 01/20/2021, 11:35 AM   Rolinda Roan, PT, DPT Acute Rehabilitation Services Pager: [phone] Office: [phone]

## 2021-01-20 NOTE — Discharge Summary (Signed)
Physician Discharge Summary  Donald Harrington [id number] DOB: October 02, 1956 DOA: 01/03/2021  PCP: Patient, No Pcp Per (Inactive)  Admit date: 01/03/2021 Discharge date: 01/20/2021  Admitted From: home Disposition: Skilled nursing facility  Recommendations for Outpatient Follow-up:  1. Follow up with PCP in 1-2 weeks 2. Please obtain BMP/CBC in one week 3. He will need to continue IV penicillin and gentamicin for 2 weeks then 4 additional weeks of penicillin for a total of 6 weeks of antibiotic coverage.   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 fib status post cardioversion 05/2014  And NICM, dyslipidemia, continued smoker Patient has been maintained on warfarin mainly because of atrial arrhythmias Echo 2020 done at cardiology office showed preserved LVEF normally functioning prosthetic valves He also is supposed to be seen for bladder cancer surgery by his urologist  He came in with multifocal strokes in the setting of taking Goody powders for neck pain found to have tachycardia and blood culture showed Streptococcus    Significant events: EGD on 01/06/2019 due to shortness of gastritis, duodenal ulcer but no evidence of bleeding. Status post 2 units packed red blood cells 01/08/2019 orthopantogram multiple dental caries with residual dentition lucency within the maxilla related to absent tooth 10 which may represent an residual periapical abscess. 01/06/2021 2D echocardiogram showed poor acoustic window EF of 55 MV bioprosthetic valve not well visualized with a mean gradient of 9 mmHg.  AV bioprosthetic valve not well visualized with a mean gradient of 30 mmHg  Antibiotics: 3.21.2022 rocephin 3.21.2022 Gentamacin  3.22.2022 PCN  Microbiology data: 01/04/2021 blood culture:  Streptococcus Gondi 01/07/2021 blood cultures negative till date.  Procedures: 01/08/2021 TEE no vegetation Discharge Diagnoses:  Principal Problem:   Bacteremia due to Streptococcus Active Problems:   Acute embolic stroke [facility])   GI bleed   Acute blood loss anemia   Cough   PAF (paroxysmal atrial fibrillation) (HCC)   History of aortic valve replacement   History of mitral valve replacement   Duodenal ulcer   Acute gastric erosion   Dental caries   Chronic apical periodontitis   Chronic periodontal disease   Hospital-Problem based course  Acute blood loss anemia upper GI bleed based on endoscopy 3/20 Received 2 units of PRBC this admission hemoglobin is stabilized Acute multifocal strokes Secondary to subtherapeutic INR-patient initially resistant to use of DOAC therefore D/w [name] cardiology 3/31 Patient has dual indication for anticoagulation CVA and paroxysmal A. fib as he was subtherapeutic I feel it is reasonable based on [name] information to transition to [place] Streptococcus bacteremia Sepsis physiology resolved 12/2018 Gentamicin stop date 01/20/2021 Penicillin stop date 02/17/2021 Defer to pharmacy adjustments of the same and this should be reported to [facility] clinician Dr. Kennon Portela, NP who I will CC on this note Paroxysmal A. fib CHADS2 score >4 Currently in sinus rhythm continuing Eliquis    Discharge Instructions  Discharge Instructions    [facility] pharmacist to adjust dose for Vancomycin, Aminoglycosides and other anti-infective therapies as requested by physician.   Complete by: As directed    [facility] to provide Cath Flo 34m   Complete by: As directed    Administer for PICC line occlusion and as ordered by physician for other access device issues.   Ambulatory referral to Neurology   Complete by: As directed    Follow up with  stroke clinic NP (Jessica Vanschaick or Cecille Rubin, if both  not available, consider Zachery Dauer, or Ahern) at [facility] in about 4 weeks. Thanks.   Anaphylaxis Kit: Provided to treat any anaphylactic reaction to the medication being provided to the patient if First Dose or when requested by physician   Complete by: As directed    Epinephrine 614m/ml vial / amp: Administer 0.378m(0.14m57msubcutaneously once for moderate to severe anaphylaxis, nurse to call physician and pharmacy when reaction occurs and call 911 if needed for immediate care   Diphenhydramine 35m22m IV vial: Administer 25-35mg78mIM PRN for first dose reaction, rash, itching, mild reaction, nurse to call physician and pharmacy when reaction occurs   Sodium Chloride 0.9% NS 500ml 65mAdminister if needed for hypovolemic blood pressure drop or as ordered by physician after call to physician with anaphylactic reaction   Change dressing on IV access line weekly and PRN   Complete by: As directed    Diet - low sodium heart healthy   Complete by: As directed    Flush IV access with Sodium Chloride 0.9% and Heparin 10 units/ml or 100 units/ml   Complete by: As directed    Home infusion instructions - [facility]   Complete by: As directed    Instructions: Flush IV access with Sodium Chloride 0.9% and Heparin 10units/ml or 100units/ml   Change dressing on IV access line: Weekly and PRN   Instructions Cath Flo 2mg: A58mnister for PICC Line occlusion and as ordered by physician for other access device   [facility] pharmacist to adjust dose for: Vancomycin, Aminoglycosides and other anti-infective therapies as requested by physician   Increase activity slowly   Complete by: As directed    Method of administration may be changed at the discretion of home infusion pharmacist based upon assessment of the patient and/or caregiver's ability to self-administer the medication ordered   Complete by: As directed      Allergies as of 01/20/2021   No Known Allergies     Medication List     STOP taking these medications   warfarin 5 MG tablet Commonly known as: COUMADIN     TAKE these medications   apixaban 5 MG Tabs tablet Commonly known as: ELIQUIS Take 1 tablet (5 mg total) by mouth 2 (two) times daily.   cyclobenzaprine 10 MG tablet Commonly known as: FLEXERIL Take 10 mg by mouth daily as needed.   Euthyrox 25 MCG tablet Generic drug: levothyroxine Take 25 mcg by mouth daily.   furosemide 20 MG tablet Commonly known as: LASIX Take 40 mg by mouth daily.   gentamicin 200 mg in dextrose 5 % 50 mL Inject 200 mg into the vein daily.   Goodys Extra Strength [id number] Generic drug: Aspirin-Acetaminophen-Caffeine Take 1 packet by mouth daily as needed (pain).   lovastatin 20 MG tablet Commonly known as: MEVACOR Take 20 mg by mouth at bedtime.   melatonin 5 MG Tabs Take 1 tablet (5 mg total) by mouth at bedtime.   metoprolol succinate 50 MG 24 hr tablet Commonly known as: TOPROL-XL Take 50 mg by mouth daily.   pantoprazole 40 MG tablet Commonly known as: PROTONIX Take 1 tablet (40 mg total) by mouth 2 (two) times daily for 28 days.   penicillin G  IVPB Inject 24 Million Units into the vein daily. Indication:  PV endocarditis  First Dose: No Last Day of Therapy:  02/17/2021 Labs - Once weekly:  CBC/D and BMP,  Labs - Every other week:  ESR and CRP Method of administration: Elastomeric (Continuous infusion) Method of administration may be changed at the discretion of home infusion pharmacist based upon assessment of the patient and/or caregiver's ability to self-administer the medication ordered.   penicillin G potassium 12 Million Units in dextrose 5 % 500 mL Inject 12 Million Units into the vein every 12 (twelve) hours.   potassium chloride 10 MEQ tablet Commonly known as: KLOR-CON Take 10 mEq by mouth daily.   thiamine 250 MG tablet Take 250 mg by mouth daily.            Discharge Care Instructions  (From admission, onward)          Start     Ordered   01/14/21 0000  Change dressing on IV access line weekly and PRN  (Home infusion instructions - [facility] )        01/14/21 1001          Contact information for follow-up providers    [facility]. Schedule an appointment as soon as possible for a visit in 4 week(s).   Specialty: Neurology Contact information: [address] [place] [phone]           Contact information for after-discharge care    [facility] .   Service: Skilled Nursing Contact information: [place] [phone]                 No Known Allergies  Consultations:  Infectious disease  Gastroenterology   Procedures/Studies: DG Orthopantogram  Result Date: 01/07/2021 CLINICAL DATA:  Endocarditis EXAM: ORTHOPANTOGRAM/PANORAMIC COMPARISON:  None. FINDINGS: The mandible is intact. The maxilla is intact. There is numerous absent dentition involving the maxillary and mandibular molars and the majority of a maxillary and mandibular premolars.z dental caries are identified within tooth 8, the residual fragment of tooth 13, tooth 24, and tooth 25. A lucent lesion is seen within the maxilla within the defect related to the absent tooth 10 demonstrating relatively poorly circumscribed margins and no significant matrix which may represent a residual periapical abscess. IMPRESSION: Multiple dental caries within the residual dentition as described above. Lucency within the maxilla within the defect related to the absent tooth 10 which may represent a a residual periapical abscess. Electronically Signed   By: [name]   On: 01/07/2021 11:20   MR ANGIO HEAD WO CONTRAST  Result Date: 01/04/2021 CLINICAL DATA:  Multiple embolic strokes. EXAM: MRA HEAD WITHOUT CONTRAST TECHNIQUE: Angiographic images of the Circle of Willis were obtained using  MRA technique without intravenous contrast. COMPARISON:  MRI yesterday. FINDINGS: Both internal carotid arteries are patent through the skull base and siphon regions. The right ICA supplies the right middle cerebral artery territory and both anterior cerebral artery territories. No evidence of large vessel occlusion. No correctable proximal stenosis. Left internal carotid artery supplies the left middle cerebral artery territory and the left PCA. No large vessel occlusion. Both vertebral arteries are patent to the basilar. No basilar stenosis. Posterior circulation branch vessels show flow. Left PCA takes fetal origin from the anterior circulation, as noted above. IMPRESSION: No large or medium vessel occlusion or correctable proximal stenosis. Electronically Signed   By: Nelson Chimes M.D.   On: 01/04/2021 16:47   MR CERVICAL SPINE WO CONTRAST  Result Date: 01/10/2021 CLINICAL DATA:  Initial evaluation for acute neck pain, infection suspected.  EXAM: MRI CERVICAL SPINE WITHOUT CONTRAST TECHNIQUE: Multiplanar, multisequence MR imaging of the cervical spine was performed. No intravenous contrast was administered. COMPARISON:  Prior MRI from 12/05/2020. FINDINGS: Alignment: Examination somewhat degraded by motion artifact. Straightening with slight reversal of the normal cervical lordosis. Trace anterolisthesis of C3 on C4, stable. Vertebrae: Vertebral body height maintained without acute or interval fracture. Bone marrow signal intensity diffusely heterogeneous without worrisome osseous lesion. Discogenic reactive endplate changes present about the C5-6 through C7-T1 interspaces, with partial ankylosis across the C6-7 disc space. Appearance is stable from previous. No signal changes to suggest osteomyelitis discitis or septic arthritis. Cord: Normal signal and morphology.  No epidural collections. Posterior Fossa, vertebral arteries, paraspinal tissues: Visualized brain and posterior fossa within normal limits.  Craniocervical junction normal. Paraspinous and prevertebral soft tissues within normal limits. Normal flow voids seen within the vertebral arteries bilaterally. Disc levels: C2-C3: Small central disc protrusion mildly indents the ventral thecal sac, slightly asymmetric to the left. No significant spinal stenosis or cord deformity. Superimposed mild left greater than right facet degeneration without significant foraminal stenosis. Appearance is stable. C3-C4: Trace anterolisthesis. Central disc protrusion indents the ventral thecal sac, contacting and mildly flattening the ventral spinal cord. No cord signal changes or significant spinal stenosis. Superimposed left greater than right uncovertebral and facet hypertrophy with resultant mild left C4 foraminal stenosis. Right neural foramina remains patent. Appearance is stable. C4-C5: Degenerative intervertebral disc space narrowing with diffuse disc osteophyte complex, eccentric to the left. Broad posterior component flattens and partially effaces the ventral thecal sac with resultant mild spinal stenosis. Mild cord flattening without cord signal changes. Superimposed bilateral facet hypertrophy with resultant moderate to severe bilateral C5 foraminal narrowing. Appearance is stable. C5-C6: Degenerative intervertebral disc space narrowing with diffuse disc osteophyte complex, asymmetric to the left. Broad posterior component flattens and partially faces the ventral thecal sac. Mild flattening of the ventral cord, greater on the left. No cord signal changes. Mild spinal stenosis is stable. Moderate bilateral C6 foraminal narrowing, worse on the right, also stable. C6-C7: Advanced degenerative intervertebral disc space narrowing with partial ankylosis across the C6-7 interspace. Right greater than left uncovertebral spurring. Posterior disc osteophyte flattens and partially effaces the ventral thecal sac without significant stenosis or cord impingement. Superimposed  mild facet hypertrophy. Mild bilateral C7 foraminal stenosis is relatively stable. C7-T1: Degenerative intervertebral disc space narrowing with diffuse disc osteophyte complex. Flattening and partial effacement of the ventral thecal sac without significant spinal stenosis or cord deformity. Superimposed bilateral facet hypertrophy. Moderate left with mild right C8 foraminal narrowing, stable. Visualized upper thoracic spine demonstrates no significant finding. IMPRESSION: 1. No MRI evidence for acute infection or other abnormality within the cervical spine. 2. Multilevel cervical spondylosis with resultant mild spinal stenosis at C4-5 and C5-6. 3. Multifactorial degenerative changes with resultant multilevel foraminal narrowing as above. Notable findings include moderate to severe bilateral C5 foraminal narrowing, with moderate bilateral C6 and left C8 foraminal stenosis. Electronically Signed   By: Jeannine Boga M.D.   On: 01/10/2021 23:15   DG CHEST PORT 1 VIEW  Result Date: 01/05/2021 CLINICAL DATA:  Shortness of breath EXAM: PORTABLE CHEST 1 VIEW COMPARISON:  01/03/2021 FINDINGS: Prior median sternotomy and valve replacement. Heart is normal size. Hyperinflation/COPD. Bibasilar opacities. No effusions. No acute bony abnormality. IMPRESSION: COPD. Bibasilar atelectasis or infiltrates, new since prior study. Electronically Signed   By: Rolm Baptise M.D.   On: 01/05/2021 15:35   DG CHEST PORT 1 VIEW  Result Date:  01/03/2021 CLINICAL DATA:  Cough.  Acute blood loss. EXAM: PORTABLE CHEST 1 VIEW COMPARISON:  January 03, 2021 FINDINGS: Stable sternotomy wires and cardiomegaly. The hila and mediastinum are unremarkable. Skin fold over the upper lateral left chest. No pneumothorax. No nodules or masses. Mild interstitial prominence without overt edema. No focal infiltrate. IMPRESSION: Probable mild pulmonary venous congestion. No other acute abnormalities. Electronically Signed   By: Dorise Bullion III M.D    On: 01/03/2021 21:21   ECHOCARDIOGRAM COMPLETE  Result Date: 01/06/2021    ECHOCARDIOGRAM REPORT   Patient Name:   Donald Harrington Date of Exam: 01/05/2021 Medical Rec #:  [id number]    Height:       70.5 in Accession #:    [id number]   Weight:       165.0 lb Date of Birth:  Oct 06, 1956    BSA:          1.934 m Patient Age:    65 years     BP:           109/68 mmHg Patient Gender: M            HR:           110 bpm. Exam Location:  Inpatient Procedure: 2D Echo Indications:    Stroke  History:        Patient has no prior history of Echocardiogram examinations.                 Aortic and mitral replacement in 2015 at [facility] due                 to endocarditis; Aortic Valve Disease, Mitral Valve Disease and                 Endocarditis.  Sonographer:    Merrie Roof RDCS Referring Phys: [place]  1. Poor acoustic windows. Cannot exclude vegetations. Would recomm TEE to further evaluate if clinically indicated.  2. Left ventricular ejection fraction, by estimation, is 55 to 60%. The left ventricle has normal function. The left ventricle has no regional wall motion abnormalities. Indeterminate diastolic filling due to E-A fusion.  3. Right ventricular systolic function is normal. The right ventricular size is normal.  4. Left atrial size was mild to moderately dilated.  5. MV prosthesis (Epic bioprosthesis, 05/01/14) Not well visualized. Peak and mean gradients through the valveare 19 and 9 mm Hx respectively. Compared to echo report from 06/05/19 [facility]) mild increase in mean gradient (6 to 9 mm). The mitral valve has been repaired/replaced. Trivial mitral valve regurgitation.  6. AV prosthesis (23 mm Hancock bioprosthesis, 05/01/14) present Not well visualized. Peak and mean gradients through the valve are 51 and 30 mm Hg respectively Comparee to reprot for 8/117/20 [facility]), mean graidnet is increased. . The aortic valve has been repaired/replaced. Aortic valve regurgitation is trivial.   7. The inferior vena cava is normal in size with greater than 50% respiratory variability, suggesting right atrial pressure of 3 mmHg. FINDINGS  Left Ventricle: Left ventricular ejection fraction, by estimation, is 55 to 60%. The left ventricle has normal function. The left ventricle has no regional wall motion abnormalities. The left ventricular internal cavity size was normal in size. There is  no left ventricular hypertrophy. Indeterminate diastolic filling due to E-A fusion. Right Ventricle: The right ventricular size is normal. Right vetricular wall thickness was not assessed. Right ventricular systolic function is normal. Left Atrium: Left atrial size was  mild to moderately dilated. Right Atrium: Right atrial size was normal in size. Pericardium: There is no evidence of pericardial effusion. Mitral Valve: MV prosthesis (Epic bioprosthesis, 05/01/14) Not well visualized peak and mean gradients through the valveare 19 and 9 mm Hx respectively. Compared to echo reprot for 06/05/19 [facility]) mild increase in mean gradient (6 to 9 mm). The mitral valve has been repaired/replaced. Trivial mitral valve regurgitation. MV peak gradient, 18.5 mmHg. The mean mitral valve gradient is 9.0 mmHg. Tricuspid Valve: The tricuspid valve is grossly normal. Tricuspid valve regurgitation is mild. Aortic Valve: AV prosthesis (23 mm Hancock bioprosthesis, 05/01/14) present Not well visualized. Peak and mean gradients through the valve are 51 and 30 mm Hg respectively Comparee to reprot for 8/117/20 [facility]), mean graidnet is increased. The aortic valve has been repaired/replaced. Aortic valve regurgitation is trivial. Aortic valve mean gradient measures 30.0 mmHg. Aortic valve peak gradient measures 50.7 mmHg. Aortic valve area, by VTI measures 0.43 cm. Pulmonic Valve: The pulmonic valve was not well visualized. Pulmonic valve regurgitation is not visualized. Aorta: The aortic root is normal in size and structure. Venous: The inferior vena  cava is normal in size with greater than 50% respiratory variability, suggesting right atrial pressure of 3 mmHg. IAS/Shunts: The interatrial septum was not assessed.  LEFT VENTRICLE PLAX 2D LVIDd:         5.10 cm  Diastology LVIDs:         3.70 cm  LV e' lateral: 10.30 cm/s LV PW:         0.80 cm LV IVS:        1.00 cm LVOT diam:     2.00 cm LV SV:         33 LV SV Index:   17 LVOT Area:     3.14 cm  RIGHT VENTRICLE          IVC RV Basal diam:  3.80 cm  IVC diam: 1.90 cm LEFT ATRIUM             Index       RIGHT ATRIUM           Index LA diam:        5.20 cm 2.69 cm/m  RA Area:     17.40 cm LA Vol (A2C):   64.0 ml 33.10 ml/m RA Volume:   45.50 ml  23.53 ml/m LA Vol (A4C):   95.3 ml 49.29 ml/m LA Biplane Vol: 82.6 ml 42.72 ml/m  AORTIC VALVE AV Area (Vmax):    0.67 cm AV Area (Vmean):   0.50 cm AV Area (VTI):     0.43 cm AV Vmax:           356.00 cm/s AV Vmean:          262.000 cm/s AV VTI:            0.752 m AV Peak Grad:      50.7 mmHg AV Mean Grad:      30.0 mmHg LVOT Vmax:         75.50 cm/s LVOT Vmean:        42.000 cm/s LVOT VTI:          0.104 m LVOT/AV VTI ratio: 0.14  AORTA Ao Root diam: 3.10 cm MITRAL VALVE             TRICUSPID VALVE MV Area VTI:  0.60 cm   TR Peak grad:   30.9 mmHg MV Peak grad: 18.5 mmHg  TR  Vmax:        278.00 cm/s MV Mean grad: 9.0 mmHg MV Vmax:      2.15 m/s   SHUNTS MV Vmean:     139.0 cm/s Systemic VTI:  0.10 m                          Systemic Diam: 2.00 cm Dorris Carnes MD Electronically signed by Dorris Carnes MD Signature Date/Time: 01/06/2021/4:04:54 PM    Final    ECHO TEE  Result Date: 01/08/2021    TRANSESOPHOGEAL ECHO REPORT   Patient Name:   Donald Harrington Date of Exam: 01/08/2021 Medical Rec #:  [id number]    Height:       70.5 in Accession #:    [id number]   Weight:       175.9 lb Date of Birth:  [birth year]    BSA:          1.987 m Patient Age:    37 years     BP:           86/59 mmHg Patient Gender: M            HR:           91 bpm. Exam Location:  Inpatient  Procedure: Transesophageal Echo, Cardiac Doppler and Color Doppler Indications:     Bacteremia  History:         Patient has prior history of Echocardiogram examinations, most                  recent 01/06/2021. Previous Myocardial Infarction, Stroke,                  Endocarditis and AVR. MVR, Arrythmias:Atrial Fibrillation,                  Signs/Symptoms:Bacteremia; Risk Factors:Hypertension.  Sonographer:     Dustin Flock Referring Phys:  [id number] Tami Lin [facility] Diagnosing Phys: Mertie Moores MD PROCEDURE: The transesophogeal probe was passed without difficulty through the esophogus of the patient. Sedation performed by performing physician. The patient was monitored while under deep sedation. Anesthestetic sedation was provided intravenously by  Anesthesiology: 233.62m of Propofol, 858mof Lidocaine. The patient developed no complications during the procedure. IMPRESSIONS  1. Left ventricular ejection fraction, by estimation, is 55 to 60%. The left ventricle has normal function.  2. Right ventricular systolic function is normal. The right ventricular size is normal.  3. No left atrial/left atrial appendage thrombus was detected.  4. The mitral valve has been repaired/replaced. No evidence of mitral valve regurgitation. No evidence of mitral stenosis.  5. The tricuspid valve is abnormal. Tricuspid valve regurgitation is moderate.  6. The aortic valve has been repaired/replaced. Aortic valve regurgitation is not visualized. No aortic stenosis is present. FINDINGS  Left Ventricle: Left ventricular ejection fraction, by estimation, is 55 to 60%. The left ventricle has normal function. The left ventricular internal cavity size was normal in size. Right Ventricle: The right ventricular size is normal. No increase in right ventricular wall thickness. Right ventricular systolic function is normal. Left Atrium: Left atrial size was normal in size. No left atrial/left atrial appendage thrombus was detected. Right  Atrium: Right atrial size was normal in size. Pericardium: There is no evidence of pericardial effusion. Mitral Valve: The mitral valve has been repaired/replaced. No evidence of mitral valve regurgitation. There is a bioprosthetic valve present in the mitral position. No evidence of mitral valve stenosis. There  is no evidence of mitral valve vegetation. Tricuspid Valve: The tricuspid valve is abnormal. Tricuspid valve regurgitation is moderate. Aortic Valve: The aortic valve has been repaired/replaced. Aortic valve regurgitation is not visualized. No aortic stenosis is present. There is a bioprosthetic valve present in the aortic position. There is no evidence of aortic valve vegetation. Pulmonic Valve: The pulmonic valve was grossly normal. Pulmonic valve regurgitation is trivial. Aorta: The aortic root and ascending aorta are structurally normal, with no evidence of dilitation. There is minimal (Grade I) plaque. IAS/Shunts: No atrial level shunt detected by color flow Doppler. Mertie Moores MD Electronically signed by Mertie Moores MD Signature Date/Time: 01/08/2021/3:13:00 PM    Final    VAS US CAROTID  Result Date: 01/07/2021 Carotid Arterial Duplex Study Indications:       CVA. Risk Factors:      Hypertension, coronary artery disease, prior CVA. 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                    MVR and AVR 2015. PAF, on Coumadin. Comparison Study:  No prior study Performing Technologist: Sharion Dove RVS  Examination Guidelines: A complete evaluation includes B-mode imaging, spectral Doppler, color Doppler, and power Doppler as needed of all accessible portions of each vessel. Bilateral testing is considered an integral part of a complete examination. Limited examinations for reoccurring indications may be performed as noted.  Right Carotid Findings: +----------+--------+--------+--------+------------------+------------------+           PSV cm/sEDV  cm/sStenosisPlaque DescriptionComments           +----------+--------+--------+--------+------------------+------------------+ CCA Prox  79      32                                intimal thickening +----------+--------+--------+--------+------------------+------------------+ CCA Distal82      30                                intimal thickening +----------+--------+--------+--------+------------------+------------------+ ICA Prox  128     49      40-59%  calcific          Shadowing          +----------+--------+--------+--------+------------------+------------------+ ICA Distal101     38                                                   +----------+--------+--------+--------+------------------+------------------+ ECA       134     20                                                   +----------+--------+--------+--------+------------------+------------------+ +----------+--------+-------+--------+-------------------+           PSV cm/sEDV cmsDescribeArm Pressure (mmHG) +----------+--------+-------+--------+-------------------+ Subclavian162                                        +----------+--------+-------+--------+-------------------+ +---------+--------+--+--------+--+ VertebralPSV cm/s69EDV cm/s21 +---------+--------+--+--------+--+  Left Carotid Findings: +----------+--------+--------+--------+------------------+---------+  PSV cm/sEDV cm/sStenosisPlaque DescriptionComments  +----------+--------+--------+--------+------------------+---------+ CCA Prox  140     48              heterogenous                +----------+--------+--------+--------+------------------+---------+ CCA Distal131     32              heterogenous                +----------+--------+--------+--------+------------------+---------+ ICA Prox  140     48      40-59%  calcific          Shadowing  +----------+--------+--------+--------+------------------+---------+ ICA Mid   108     48                                          +----------+--------+--------+--------+------------------+---------+ ICA Distal101     38                                          +----------+--------+--------+--------+------------------+---------+ ECA       135     37                                          +----------+--------+--------+--------+------------------+---------+ +----------+--------+--------+--------+-------------------+           PSV cm/sEDV cm/sDescribeArm Pressure (mmHG) +----------+--------+--------+--------+-------------------+ [id number]                                          +----------+--------+--------+--------+-------------------+ +---------+--------+--+--------+--+ VertebralPSV cm/s58EDV cm/s22 +---------+--------+--+--------+--+   Summary: Right Carotid: Velocities in the right ICA are consistent with a 40-59%                stenosis. Left Carotid: Velocities in the left ICA are consistent with a 40-59% stenosis. Vertebrals:  Bilateral vertebral arteries demonstrate antegrade flow. Subclavians: Normal flow hemodynamics were seen in bilateral subclavian              arteries. *See table(s) above for measurements and observations.  Electronically signed by Antony Contras MD on 01/07/2021 at 9:02:50 AM.    Final    [place] EKG SITE RITE  Result Date: 01/10/2021 If Site Rite image not attached, placement could not be confirmed due to current cardiac rhythm.   Subjective: No new complaints.  Discharge Exam: Vitals:   01/20/21 0822 01/20/21 1200  BP: (!) 89/66 123/76  Pulse: 78 80  Resp: 20 18  Temp: 100.1 F (37.8 C) 98.9 F (37.2 C)  SpO2: 92% 93%   Vitals:   01/19/21 2339 01/20/21 0500 01/20/21 0822 01/20/21 1200  BP: 93/68 (!) 88/60 (!) 89/66 123/76  Pulse:   78 80  Resp:   20 18  Temp: 98.8 F (37.1 C) 99.3 F (37.4 C) 100.1 F (37.8 C) 98.9 F  (37.2 C)  TempSrc: Oral Oral Oral Oral  SpO2: 95% 93% 92% 93%  Weight:      Height:        General: Pt is alert, awake, not in acute distress Cardiovascular: RRR, S1/S2 +, no rubs, no gallops Respiratory: CTA bilaterally, no  wheezing, no rhonchi Abdominal: Soft, NT, ND, bowel sounds + Extremities: no edema, no cyanosis    The results of significant diagnostics from this hospitalization (including imaging, microbiology, ancillary and laboratory) are listed below for reference.     Microbiology: No results found for this or any previous visit (from the past 240 hour(s)).   Labs: BNP (last 3 results) Recent Labs    01/04/21 0322  BNP 161.0*   Basic Metabolic Panel: Recent Labs  Lab 01/15/21 1119 01/17/21 0943 01/18/21 0305 01/19/21 0400  NA 136 138 135 135  K 4.1 4.0 3.9 3.8  CL 102 103 101 101  CO2 '27 28 28 27  ' GLUCOSE 91 92 97 93  BUN '9 15 16 14  ' CREATININE 1.05 1.12 1.10 1.15  CALCIUM 8.9 9.0 8.7* 8.7*   Liver Function Tests: Recent Labs  Lab 01/17/21 0943 01/18/21 0305 01/19/21 0400  AST 14* 10* 10*  ALT '13 11 10  ' ALKPHOS 50 48 42  BILITOT 0.5 0.6 0.6  PROT 6.0* 5.8* 5.8*  ALBUMIN 2.5* 2.4* 2.4*   No results for input(s): LIPASE, AMYLASE in the last 168 hours. No results for input(s): AMMONIA in the last 168 hours. CBC: Recent Labs  Lab 01/17/21 0943 01/18/21 0305 01/19/21 0400  WBC 4.3 4.7 4.5  NEUTROABS 3.1 3.2 3.0  HGB 9.6* 9.2* 9.2*  HCT 29.5* 27.9* 28.2*  MCV 102.8* 103.3* 101.4*  PLT 189 195 191   Cardiac Enzymes: No results for input(s): CKTOTAL, CKMB, CKMBINDEX, TROPONINI in the last 168 hours. BNP: Invalid input(s): POCBNP CBG: Recent Labs  Lab 01/13/21 1630 01/15/21 1317  GLUCAP 114* 93   D-Dimer No results for input(s): DDIMER in the last 72 hours. Hgb A1c No results for input(s): HGBA1C in the last 72 hours. Lipid Profile No results for input(s): CHOL, HDL, LDLCALC, TRIG, CHOLHDL, LDLDIRECT in the last 72  hours. Thyroid function studies No results for input(s): TSH, T4TOTAL, T3FREE, THYROIDAB in the last 72 hours.  Invalid input(s): FREET3 Anemia work up No results for input(s): VITAMINB12, FOLATE, FERRITIN, TIBC, IRON, RETICCTPCT in the last 72 hours. Urinalysis    Component Value Date/Time   COLORURINE YELLOW 01/04/2021 1729   APPEARANCEUR CLEAR 01/04/2021 1729   LABSPEC 1.012 01/04/2021 1729   PHURINE 5.0 01/04/2021 1729   GLUCOSEU NEGATIVE 01/04/2021 1729   HGBUR MODERATE (A) 01/04/2021 1729   BILIRUBINUR NEGATIVE 01/04/2021 1729   KETONESUR NEGATIVE 01/04/2021 1729   PROTEINUR NEGATIVE 01/04/2021 1729   NITRITE NEGATIVE 01/04/2021 1729   LEUKOCYTESUR NEGATIVE 01/04/2021 1729   Sepsis Labs Invalid input(s): PROCALCITONIN,  WBC,  LACTICIDVEN Microbiology No results found for this or any previous visit (from the past 240 hour(s)).   Time coordinating discharge: Over 30 minutes  SIGNED:   Nita Sells, MD  [facility] 01/20/2021, 3:06 PM Pager   If 7PM-7AM, please contact night-coverage [email or web address] Password [email or web address]

## 2021-01-20 NOTE — Progress Notes (Signed)
R PICC placement: Bruising and skin irritation noted around perimeter with dressing removal. 1cm skin tear at distal end of PICC dressing. Insertion site unremarkable.

## 2021-01-20 NOTE — Plan of Care (Signed)
  Problem: Education: Goal: Knowledge of secondary prevention will improve Outcome: Progressing Goal: Knowledge of patient specific risk factors addressed and post discharge goals established will improve Outcome: Progressing   Problem: Health Behavior/Discharge Planning: Goal: Ability to manage health-related needs will improve Outcome: Progressing

## 2021-01-21 MED ORDER — HEPARIN SOD (PORK) LOCK FLUSH 100 UNIT/ML IV SOLN
250.0000 [IU] | INTRAVENOUS | Status: AC | PRN
Start: 2021-01-21 — End: 2021-01-21
  Administered 2021-01-21: 250 [IU]
  Filled 2021-01-21: qty 2.5

## 2021-01-21 NOTE — Progress Notes (Signed)
Patient picked up by [facility] for  transport to [facility]. VS 105/77 Hr 87, resp 18. oxygen sat 94%. Report called again to [place], at [facility]. Informed that Penicillin was stopped for transport and need to be hanged when patient arrives. Picc line hep lock by IV team nurse.

## 2021-01-24 ENCOUNTER — Telehealth: Payer: Medicare HMO | Admitting: Infectious Diseases

## 2021-01-28 ENCOUNTER — Emergency Department (HOSPITAL_COMMUNITY): Payer: Medicare HMO

## 2021-01-28 ENCOUNTER — Other Ambulatory Visit: Payer: Self-pay

## 2021-01-28 ENCOUNTER — Inpatient Hospital Stay (HOSPITAL_COMMUNITY)
Admission: EM | Admit: 2021-01-28 | Discharge: 2021-02-06 | DRG: 391 | Disposition: A | Discharge status: Skilled Nursing Facility | Payer: Medicare HMO | Source: Skilled Nursing Facility | Attending: Internal Medicine | Admitting: Internal Medicine

## 2021-01-28 ENCOUNTER — Encounter (HOSPITAL_COMMUNITY): Payer: Self-pay | Admitting: Emergency Medicine

## 2021-01-28 DIAGNOSIS — Z953 Presence of xenogenic heart valve: Secondary | ICD-10-CM | POA: Diagnosis not present

## 2021-01-28 DIAGNOSIS — E039 Hypothyroidism, unspecified: Secondary | ICD-10-CM | POA: Diagnosis present

## 2021-01-28 DIAGNOSIS — D62 Acute posthemorrhagic anemia: Secondary | ICD-10-CM | POA: Diagnosis present

## 2021-01-28 DIAGNOSIS — I428 Other cardiomyopathies: Secondary | ICD-10-CM | POA: Diagnosis present

## 2021-01-28 DIAGNOSIS — I330 Acute and subacute infective endocarditis: Secondary | ICD-10-CM | POA: Diagnosis present

## 2021-01-28 DIAGNOSIS — I10 Essential (primary) hypertension: Secondary | ICD-10-CM | POA: Diagnosis present

## 2021-01-28 DIAGNOSIS — K922 Gastrointestinal hemorrhage, unspecified: Secondary | ICD-10-CM | POA: Diagnosis present

## 2021-01-28 DIAGNOSIS — K056 Periodontal disease, unspecified: Secondary | ICD-10-CM | POA: Diagnosis present

## 2021-01-28 DIAGNOSIS — Z8711 Personal history of peptic ulcer disease: Secondary | ICD-10-CM | POA: Diagnosis not present

## 2021-01-28 DIAGNOSIS — Z79899 Other long term (current) drug therapy: Secondary | ICD-10-CM

## 2021-01-28 DIAGNOSIS — Z8679 Personal history of other diseases of the circulatory system: Secondary | ICD-10-CM | POA: Diagnosis not present

## 2021-01-28 DIAGNOSIS — D127 Benign neoplasm of rectosigmoid junction: Secondary | ICD-10-CM | POA: Diagnosis present

## 2021-01-28 DIAGNOSIS — Z20822 Contact with and (suspected) exposure to covid-19: Secondary | ICD-10-CM | POA: Diagnosis present

## 2021-01-28 DIAGNOSIS — Z8551 Personal history of malignant neoplasm of bladder: Secondary | ICD-10-CM

## 2021-01-28 DIAGNOSIS — I959 Hypotension, unspecified: Secondary | ICD-10-CM | POA: Diagnosis present

## 2021-01-28 DIAGNOSIS — I480 Paroxysmal atrial fibrillation: Secondary | ICD-10-CM | POA: Diagnosis present

## 2021-01-28 DIAGNOSIS — D123 Benign neoplasm of transverse colon: Secondary | ICD-10-CM | POA: Diagnosis present

## 2021-01-28 DIAGNOSIS — E538 Deficiency of other specified B group vitamins: Secondary | ICD-10-CM | POA: Diagnosis present

## 2021-01-28 DIAGNOSIS — T826XXA Infection and inflammatory reaction due to cardiac valve prosthesis, initial encounter: Secondary | ICD-10-CM | POA: Diagnosis present

## 2021-01-28 DIAGNOSIS — F17210 Nicotine dependence, cigarettes, uncomplicated: Secondary | ICD-10-CM | POA: Diagnosis present

## 2021-01-28 DIAGNOSIS — I2510 Atherosclerotic heart disease of native coronary artery without angina pectoris: Secondary | ICD-10-CM | POA: Diagnosis present

## 2021-01-28 DIAGNOSIS — K2970 Gastritis, unspecified, without bleeding: Secondary | ICD-10-CM | POA: Diagnosis present

## 2021-01-28 DIAGNOSIS — B955 Unspecified streptococcus as the cause of diseases classified elsewhere: Secondary | ICD-10-CM | POA: Diagnosis present

## 2021-01-28 DIAGNOSIS — D649 Anemia, unspecified: Secondary | ICD-10-CM | POA: Diagnosis not present

## 2021-01-28 DIAGNOSIS — B954 Other streptococcus as the cause of diseases classified elsewhere: Secondary | ICD-10-CM | POA: Diagnosis present

## 2021-01-28 DIAGNOSIS — K3189 Other diseases of stomach and duodenum: Principal | ICD-10-CM | POA: Diagnosis present

## 2021-01-28 DIAGNOSIS — Z7901 Long term (current) use of anticoagulants: Secondary | ICD-10-CM

## 2021-01-28 DIAGNOSIS — K921 Melena: Secondary | ICD-10-CM | POA: Diagnosis present

## 2021-01-28 DIAGNOSIS — Z952 Presence of prosthetic heart valve: Secondary | ICD-10-CM | POA: Diagnosis not present

## 2021-01-28 DIAGNOSIS — R7881 Bacteremia: Secondary | ICD-10-CM | POA: Diagnosis present

## 2021-01-28 DIAGNOSIS — I252 Old myocardial infarction: Secondary | ICD-10-CM | POA: Diagnosis not present

## 2021-01-28 DIAGNOSIS — Z8349 Family history of other endocrine, nutritional and metabolic diseases: Secondary | ICD-10-CM

## 2021-01-28 DIAGNOSIS — D124 Benign neoplasm of descending colon: Secondary | ICD-10-CM | POA: Diagnosis present

## 2021-01-28 DIAGNOSIS — Y838 Other surgical procedures as the cause of abnormal reaction of the patient, or of later complication, without mention of misadventure at the time of the procedure: Secondary | ICD-10-CM | POA: Diagnosis present

## 2021-01-28 DIAGNOSIS — Z79890 Hormone replacement therapy (postmenopausal): Secondary | ICD-10-CM

## 2021-01-28 DIAGNOSIS — Z8673 Personal history of transient ischemic attack (TIA), and cerebral infarction without residual deficits: Secondary | ICD-10-CM

## 2021-01-28 LAB — I-STAT BETA HCG BLOOD, ED (MC, WL, AP ONLY): I-stat hCG, quantitative: <5 m[IU]/mL (ref <5)

## 2021-01-28 LAB — COMPREHENSIVE METABOLIC PANEL
ALT: 10 U/L (ref 0–44)
AST: 11 U/L — ABNORMAL LOW (ref 15–41)
Albumin: 2.6 g/dL — ABNORMAL LOW (ref 3.5–5.0)
Alkaline Phosphatase: 43 U/L (ref 38–126)
Anion gap: 8 (ref 5–15)
BUN: 23 mg/dL (ref 8–23)
CO2: 26 mmol/L (ref 22–32)
Calcium: 8.4 mg/dL — ABNORMAL LOW (ref 8.9–10.3)
Chloride: 107 mmol/L (ref 98–111)
Creatinine, Ser: 1.28 mg/dL — ABNORMAL HIGH (ref 0.61–1.24)
GFR, Estimated: >60 mL/min (ref >60)
Glucose, Bld: 109 mg/dL — ABNORMAL HIGH (ref 70–99)
Potassium: 3.8 mmol/L (ref 3.5–5.1)
Sodium: 141 mmol/L (ref 135–145)
Total Bilirubin: 0.5 mg/dL (ref 0.3–1.2)
Total Protein: 5.8 g/dL — ABNORMAL LOW (ref 6.5–8.1)

## 2021-01-28 LAB — CBC
HCT: 19.5 % — ABNORMAL LOW (ref 39.0–52.0)
Hemoglobin: 6.1 g/dL — CL (ref 13.0–17.0)
MCH: 33.5 pg (ref 26.0–34.0)
MCHC: 31.3 g/dL (ref 30.0–36.0)
MCV: 107.1 fL — ABNORMAL HIGH (ref 80.0–100.0)
Platelets: 198 10*3/uL (ref 150–400)
RBC: 1.82 MIL/uL — ABNORMAL LOW (ref 4.22–5.81)
RDW: 15.4 % (ref 11.5–15.5)
WBC: 4.4 10*3/uL (ref 4.0–10.5)
nRBC: 0 % (ref 0.0–0.2)

## 2021-01-28 LAB — HEMOGLOBIN AND HEMATOCRIT, BLOOD
HCT: 23 % — ABNORMAL LOW (ref 39.0–52.0)
HCT: 23.6 % — ABNORMAL LOW (ref 39.0–52.0)
Hemoglobin: 7.4 g/dL — ABNORMAL LOW (ref 13.0–17.0)
Hemoglobin: 7.6 g/dL — ABNORMAL LOW (ref 13.0–17.0)

## 2021-01-28 LAB — PROTIME-INR
INR: 1.3 — ABNORMAL HIGH (ref 0.8–1.2)
Prothrombin Time: 15.9 seconds — ABNORMAL HIGH (ref 11.4–15.2)

## 2021-01-28 LAB — MRSA PCR SCREENING: MRSA by PCR: NEGATIVE

## 2021-01-28 LAB — POC OCCULT BLOOD, ED: Fecal Occult Bld: POSITIVE — AB

## 2021-01-28 LAB — PREPARE RBC (CROSSMATCH)

## 2021-01-28 LAB — RESP PANEL BY RT-PCR (FLU A&B, COVID) ARPGX2
Influenza A by PCR: NEGATIVE
Influenza B by PCR: NEGATIVE
SARS Coronavirus 2 by RT PCR: NEGATIVE

## 2021-01-28 IMAGING — DX DG CHEST 1V PORT
1 series · 1 of 1 positions shown · non-contrast
Comparison: [DATE]

CLINICAL DATA: Weakness

EXAM:
PORTABLE CHEST 1 VIEW

[chest ap]
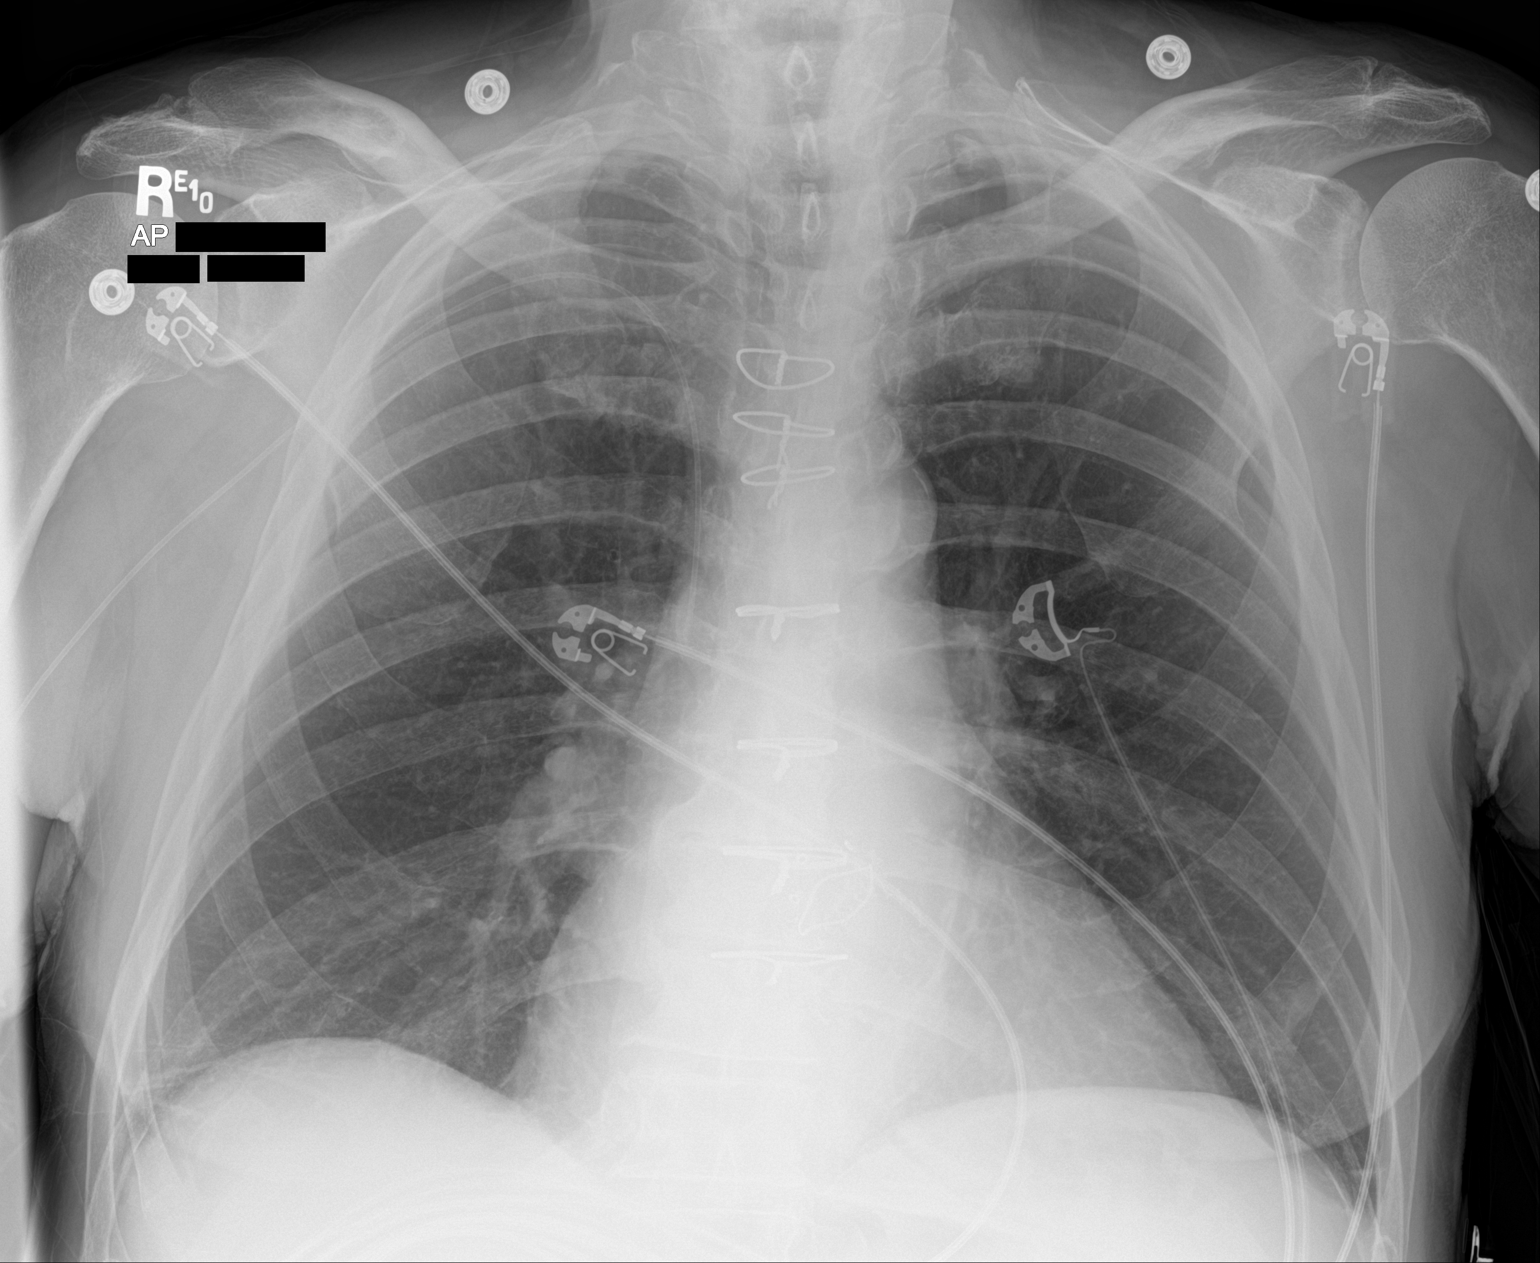

[1 of 1 positions shown; findings below may reference images not displayed]

FINDINGS: New right-sided PICC line is noted with the catheter tip in the mid
superior vena cava. Cardiac shadow is stable. Changes of prior heart
surgery are noted. Lungs are clear bilaterally. No bony abnormality
is seen.
IMPRESSION: No active disease.

## 2021-01-28 MED ORDER — SODIUM CHLORIDE 0.9% IV SOLUTION: Freq: Once | INTRAVENOUS | Status: AC

## 2021-01-28 MED ORDER — MELATONIN 3 MG PO TABS
3.0000 mg | ORAL_TABLET | Freq: Every evening | ORAL | Status: DC | PRN
  Administered 2021-01-28 – 2021-02-02 (×6): 3 mg via ORAL
  Filled 2021-01-28 (×6): qty 1

## 2021-01-28 MED ORDER — SODIUM CHLORIDE 0.9% FLUSH
3.0000 mL | Freq: Two times a day (BID) | INTRAVENOUS | Status: DC
  Administered 2021-01-28 – 2021-02-03 (×11): 3 mL via INTRAVENOUS

## 2021-01-28 MED ORDER — SODIUM CHLORIDE 0.9 % IV SOLN: INTRAVENOUS | Status: DC

## 2021-01-28 MED ORDER — LACTATED RINGERS IV SOLN: INTRAVENOUS | Status: AC

## 2021-01-28 MED ORDER — SODIUM CHLORIDE 0.9 % IV BOLUS
500.0000 mL | Freq: Once | INTRAVENOUS | Status: AC
  Administered 2021-01-28: 500 mL via INTRAVENOUS

## 2021-01-28 MED ORDER — PENICILLIN G POTASSIUM 20000000 UNITS IJ SOLR
12.0000 10*6.[IU] | Freq: Two times a day (BID) | INTRAVENOUS | Status: DC
  Administered 2021-01-28 – 2021-02-05 (×17): 12 10*6.[IU] via INTRAVENOUS
  Filled 2021-01-28 (×20): qty 12

## 2021-01-28 MED ORDER — ORAL CARE MOUTH RINSE
15.0000 mL | Freq: Two times a day (BID) | OROMUCOSAL | Status: DC
  Administered 2021-01-28 – 2021-02-05 (×9): 15 mL via OROMUCOSAL

## 2021-01-28 MED ORDER — PEG 3350-KCL-NA BICARB-NACL 420 G PO SOLR
4000.0000 mL | Freq: Once | ORAL | Status: AC
  Administered 2021-01-29: 4000 mL via ORAL
  Filled 2021-01-28: qty 4000

## 2021-01-28 MED ORDER — LEVOTHYROXINE SODIUM 25 MCG PO TABS
25.0000 ug | ORAL_TABLET | Freq: Every day | ORAL | Status: DC
  Administered 2021-01-29 – 2021-02-06 (×8): 25 ug via ORAL
  Filled 2021-01-28 (×8): qty 1

## 2021-01-28 MED ORDER — ACETAMINOPHEN 325 MG PO TABS
650.0000 mg | ORAL_TABLET | Freq: Four times a day (QID) | ORAL | Status: DC | PRN
  Administered 2021-01-28 – 2021-02-06 (×18): 650 mg via ORAL
  Filled 2021-01-28 (×18): qty 2

## 2021-01-28 MED ORDER — CHLORHEXIDINE GLUCONATE CLOTH 2 % EX PADS
6.0000 | MEDICATED_PAD | Freq: Every day | CUTANEOUS | Status: DC
  Administered 2021-01-28 – 2021-02-05 (×7): 6 via TOPICAL

## 2021-01-28 MED ORDER — PANTOPRAZOLE SODIUM 40 MG IV SOLR
40.0000 mg | Freq: Two times a day (BID) | INTRAVENOUS | Status: DC
  Administered 2021-01-28 – 2021-02-03 (×13): 40 mg via INTRAVENOUS
  Filled 2021-01-28 (×15): qty 40

## 2021-01-28 MED ORDER — PEG 3350-KCL-NA BICARB-NACL 420 G PO SOLR: 4000.0000 mL | Freq: Once | ORAL | Status: DC

## 2021-01-28 NOTE — Consult Note (Signed)
Referring Provider: Dr. Neysa Bonito Primary Care Physician:  Patient, No Pcp Per (Inactive) Primary Gastroenterologist:  UNASSIGNED  Reason for Consultation:  Anemia  HPI: Donald Harrington is a 65 y.o. male with recent admission for GI bleed and found to have a small nonbleeding duodenal ulcer on EGD last month (Dr. Tarri Glenn). Gastritis also noted. Patient was on Goody's powders prior to that admit and denies any since then and denies other NSAIDs. History of alcohol abuse and denies any alcohol since that admission. Reports blood with wiping since discharge. Denies black stools, dizziness, N/V, abdominal pain. He refused a colonoscopy during the last admit.   Past Medical History:  Diagnosis Date  . Alcohol abuse   . Bladder cancer ([place])    remission s/p TURBT  . CAD (coronary artery disease), native coronary artery    MI in setting of endocarditis  . 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 Endocarditis    Mitral and Aortic valve 2015  . HTN (hypertension)   . Hypothyroidism   . NICM (nonischemic cardiomyopathy) ([place])    Normal EF as of 2020 echo  . PAF (paroxysmal atrial fibrillation) (H[facility])    coumadin anticoagulation due to h/o valve replacements with bioprosthetic valves  . PUD (peptic ulcer disease)    causing GIB in 2015    Past Surgical History:  Procedure Laterality Date  . AORTIC VALVE REPLACEMENT  04/2014   bioprosthetic  . BIOPSY  01/05/2021   Procedure: BIOPSY;  Surgeon: Thornton Park, MD;  Location: [place];  Service: Gastroenterology;;  . ESOPHAGOGASTRODUODENOSCOPY (EGD) WITH PROPOFOL N/A 01/05/2021   Procedure: ESOPHAGOGASTRODUODENOSCOPY (EGD) WITH PROPOFOL;  Surgeon: Thornton Park, MD;  Location: [place];  Service: Gastroenterology;  Laterality: N/A;  . LAPAROSCOPIC APPENDECTOMY  2021  . 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 TEE WITHOUT CARDIOVERSION N/A 01/08/2021   Procedure: TRANSESOPHAGEAL ECHOCARDIOGRAM (TEE);  Surgeon: Acie Fredrickson  Wonda Cheng, MD;  Location: [facility] ENDOSCOPY;  Service: Cardiovascular;  Laterality: N/A;  . TRANSURETHRAL RESECTION OF BLADDER TUMOR      Prior to Admission medications   Medication Sig Start Date End Date Taking? Authorizing Provider  apixaban (ELIQUIS) 5 MG TABS tablet Take 1 tablet (5 mg total) by mouth 2 (two) times daily. 01/13/21  Yes [name], MD  cyclobenzaprine (FLEXERIL) 10 MG tablet Take 10 mg by mouth daily as needed for muscle spasms. 12/01/20  Yes [provider]  EUTHYROX 25 MCG tablet Take 25 mcg by mouth daily. 10/28/20  Yes [provider]  furosemide (LASIX) 20 MG tablet Take 40 mg by mouth daily. 01/30/20  Yes [provider]  lovastatin (MEVACOR) 20 MG tablet Take 20 mg by mouth at bedtime. 11/25/20  Yes [provider]  melatonin 5 MG TABS Take 1 tablet (5 mg total) by mouth at bedtime. 01/13/21  Yes [name], MD  metoprolol succinate (TOPROL-XL) 50 MG 24 hr tablet Take 50 mg by mouth daily. 10/28/20  Yes [provider]  pantoprazole (PROTONIX) 40 MG tablet Take 1 tablet (40 mg total) by mouth 2 (two) times daily for 28 days. 01/14/21 02/11/21 Yes [name], MD  penicillin G potassium 12 Million Units in dextrose 5 % 500 mL Inject 12 Million Units into the vein every 12 (twelve) hours. 01/13/21  Yes [name], MD  thiamine 250 MG tablet Take 250 mg by mouth daily.   Yes [provider]  gentamicin 200 mg in dextrose 5 %  50 mL Inject 200 mg into the vein daily. Patient not taking: No sig reported 01/13/21   [name], MD  penicillin G IVPB Inject 24 Million Units into the vein daily. Indication:  PV endocarditis  First Dose: No Last Day of Therapy:  02/17/2021 Labs - Once weekly:  CBC/D and BMP, Labs - Every other week:  ESR and CRP Method of administration: Elastomeric (Continuous infusion) Method of administration may be changed at the discretion of home infusion pharmacist based  upon assessment of the patient and/or caregiver's ability to self-administer the medication ordered. Patient not taking: No sig reported 01/14/21 02/18/21  [name], MD    Scheduled Meds: . sodium chloride   Intravenous Once  . [START ON 01/29/2021] levothyroxine  25 mcg Oral Daily  . pantoprazole (PROTONIX) IV  40 mg Intravenous Q12H  . sodium chloride flush  3 mL Intravenous Q12H   Continuous Infusions: . lactated ringers     PRN Meds:.  Allergies as of 01/28/2021  . (No Known Allergies)    Family History  Problem Relation Age of Onset  . Thyroid disease Sister     Social History   Socioeconomic History  . Marital status: Single    Spouse name: Not on file  . Number of children: Not on file  . Years of education: Not on file  . Highest education level: Not on file  Occupational History  . Not on file  Tobacco Use  . Smoking status: Current Every Day Smoker    Packs/day: 1.00    Years: 40.00    Pack years: 40.00  . Smokeless tobacco: Never Used  Substance and Sexual Activity  . Alcohol use: Not Currently    Comment: last 2017  . Drug use: Never  . Sexual activity: Not on file  Other Topics Concern  . Not on file  Social History Narrative  . Not on file   Social Determinants of Health   Financial Resource Strain: Not on file  Food Insecurity: Not on file  Transportation Needs: Not on file  Physical Activity: Not on file  Stress: Not on file  Social Connections: Not on file  Intimate Partner Violence: Not on file    Review of Systems: All negative except as stated above in HPI.  Physical Exam: Vital signs: Vitals:   01/28/21 1330 01/28/21 1345  BP: (!) 98/56 (!) 98/53  Pulse: 76   Resp: 19   Temp: 98.6 F (37 C)   SpO2: 96%      General:   Lethargic, Well-developed, well-nourished, pleasant and cooperative in NAD Head: normocephalic, atraumatic Eyes: anicteric sclera ENT: oropharynx clear Neck: supple, nontender Lungs:  Clear  throughout to auscultation.   No wheezes, crackles, or rhonchi. No acute distress. Heart:  Regular rate and rhythm; no murmurs, clicks, rubs,  or gallops. Abdomen: soft, nontender, nondistended, +BS  Rectal:  Deferred Ext: no edema  GI:  Lab Results: Recent Labs    01/28/21 1035  WBC 4.4  HGB 6.1*  HCT 19.5*  PLT 198   BMET Recent Labs    01/28/21 1035  NA 141  K 3.8  CL 107  CO2 26  GLUCOSE 109*  BUN 23  CREATININE 1.28*  CALCIUM 8.4*   LFT Recent Labs    01/28/21 1035  PROT 5.8*  ALBUMIN 2.6*  AST 11*  ALT 10  ALKPHOS 43  BILITOT 0.5   PT/INR Recent Labs    01/28/21 1035  LABPROT 15.9*  INR  1.3*     Studies/Results: DG Chest Port 1 View  Result Date: 01/28/2021 CLINICAL DATA:  Weakness EXAM: PORTABLE CHEST 1 VIEW COMPARISON:  01/05/2021 FINDINGS: New right-sided PICC line is noted with the catheter tip in the mid superior vena cava. Cardiac shadow is stable. Changes of prior heart surgery are noted. Lungs are clear bilaterally. No bony abnormality is seen. IMPRESSION: No active disease. Electronically Signed   By: Inez Catalina M.D.   On: 01/28/2021 10:48    Impression/Plan: Rectal bleeding on Eliquis with recent discharge where a EGD showed a nonbleeding duodenal ulcer. Colonoscopy not done due to inadequate prep and reportedly refused additional prep. Patient willing to do prep this admit. Patient will be seen and have the procedure by Dr. Benson Norway based on the unassigned coverage rules and discussed with Dr. Benson Norway. Clear liquid diet today and tomorrow and colon prep tomorrow. Hold Eliquis.    LOS: 0 days   Lear Ng  01/28/2021, 1:50 PM  Questions please call [phone]

## 2021-01-28 NOTE — ED Notes (Signed)
Pt wants to be transferred to [facility], MD aware

## 2021-01-28 NOTE — ED Notes (Signed)
Hemoglobin 6.1,MD aware

## 2021-01-28 NOTE — ED Triage Notes (Signed)
Pt coming from [facility] with low hgb. Pt went to primary and got call saying hgb was 6.7. Pt A&Ox4. Vitals WDL 124/78 82 HR 96% RA 98.2 Temp

## 2021-01-28 NOTE — ED Provider Notes (Signed)
[facility] DEPT Provider Note   CSN: [id number] Arrival date & time: 01/28/21  2703     History No chief complaint on file.   Donald Harrington is a 65 y.o. male.  HPI 65 yo male d/c'd 4/4 after admission for bacteremia,embolic strokes, gi bleed presents today with report of anemia.  REport from [facility] of hgb 6.  Last BM Sunday and noted some blood in stool. No BM since per patient.   Upper endoscopy performed by Dr. Tarri Glenn March 20.  Reports prior history of GI bleed attributed to peptic ulcer disease in 2015.  Found normal esophagus with erosive gastropathy with no bleeding, nonbleeding duodenal ulcer with no stigmata of bleeding and otherwise examination was normal. Past Medical History:  Diagnosis Date  . Alcohol abuse   . Bladder cancer ([place])    remission s/p TURBT  . CAD (coronary artery disease), native coronary artery    MI in setting of endocarditis  . 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 Endocarditis    Mitral and Aortic valve 2015  . HTN (hypertension)   . Hypothyroidism   . NICM (nonischemic cardiomyopathy) ([place])    Normal EF as of 2020 echo  . PAF (paroxysmal atrial fibrillation) (HCC)    coumadin anticoagulation due to h/o valve replacements with bioprosthetic valves  . PUD (peptic ulcer disease)    causing GIB in 2015    Patient Active Problem List   Diagnosis Date Noted  . Dental caries   . Chronic apical periodontitis   . Chronic periodontal disease   . Bacteremia due to Streptococcus   . Duodenal ulcer   . Acute gastric erosion   . Acute embolic stroke ([place]) 50/06/3817  . GI bleed 01/03/2021  . Acute blood loss anemia 01/03/2021  . Cough 01/03/2021  . PAF (paroxysmal atrial fibrillation) ([place]) 01/03/2021  . History of aortic valve replacement 01/03/2021  . History of mitral valve replacement 01/03/2021    Past Surgical History:  Procedure Laterality Date  . AORTIC VALVE REPLACEMENT  04/2014    bioprosthetic  . BIOPSY  01/05/2021   Procedure: BIOPSY;  Surgeon: Thornton Park, MD;  Location: [facility];  Service: Gastroenterology;;  . ESOPHAGOGASTRODUODENOSCOPY (EGD) WITH PROPOFOL N/A 01/05/2021   Procedure: ESOPHAGOGASTRODUODENOSCOPY (EGD) WITH PROPOFOL;  Surgeon: Thornton Park, MD;  Location: [facility];  Service: Gastroenterology;  Laterality: N/A;  . LAPAROSCOPIC APPENDECTOMY  2021  . 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 TEE WITHOUT CARDIOVERSION N/A 01/08/2021   Procedure: TRANSESOPHAGEAL ECHOCARDIOGRAM (TEE);  Surgeon: Acie Fredrickson Wonda Cheng, MD;  Location: [facility] ENDOSCOPY;  Service: Cardiovascular;  Laterality: N/A;  . TRANSURETHRAL RESECTION OF BLADDER TUMOR         Family History  Problem Relation Age of Onset  . Thyroid disease Sister     Social History   Tobacco Use  . Smoking status: Current Every Day Smoker    Packs/day: 1.00    Years: 40.00    Pack years: 40.00  . Smokeless tobacco: Never Used  Substance Use Topics  . Alcohol use: Not Currently    Comment: last 2017  . Drug use: Never    Home Medications Prior to Admission medications   Medication Sig Start Date End Date Taking? Authorizing Provider  apixaban (ELIQUIS) 5 MG TABS tablet Take 1 tablet (5 mg total) by mouth 2 (two) times daily. 01/13/21   [name], MD  Aspirin-Acetaminophen-Caffeine (GOODYS EXTRA STRENGTH) [phone] MG PACK Take  1 packet by mouth daily as needed (pain).    [provider]  cyclobenzaprine (FLEXERIL) 10 MG tablet Take 10 mg by mouth daily as needed. 12/01/20   [provider]  EUTHYROX 25 MCG tablet Take 25 mcg by mouth daily. 10/28/20   [provider]  furosemide (LASIX) 20 MG tablet Take 40 mg by mouth daily. 01/30/20   [provider]  gentamicin 200 mg in dextrose 5 % 50 mL Inject 200 mg into the vein daily. 01/13/21   [name], MD  lovastatin (MEVACOR) 20 MG tablet Take 20 mg by mouth at bedtime.  11/25/20   [provider]  melatonin 5 MG TABS Take 1 tablet (5 mg total) by mouth at bedtime. 01/13/21   [name], MD  metoprolol succinate (TOPROL-XL) 50 MG 24 hr tablet Take 50 mg by mouth daily. 10/28/20   [provider]  pantoprazole (PROTONIX) 40 MG tablet Take 1 tablet (40 mg total) by mouth 2 (two) times daily for 28 days. 01/14/21 02/11/21  [name], MD  penicillin G IVPB Inject 24 Million Units into the vein daily. Indication:  PV endocarditis  First Dose: No Last Day of Therapy:  02/17/2021 Labs - Once weekly:  CBC/D and BMP, Labs - Every other week:  ESR and CRP Method of administration: Elastomeric (Continuous infusion) Method of administration may be changed at the discretion of home infusion pharmacist based upon assessment of the patient and/or caregiver's ability to self-administer the medication ordered. 01/14/21 02/18/21  [name], MD  penicillin G potassium 12 Million Units in dextrose 5 % 500 mL Inject 12 Million Units into the vein every 12 (twelve) hours. 01/13/21   [name], MD  potassium chloride (KLOR-CON) 10 MEQ tablet Take 10 mEq by mouth daily. 10/28/20   [provider]  thiamine 250 MG tablet Take 250 mg by mouth daily.    [provider]    Allergies    Patient has no known allergies.  Review of Systems   Review of Systems  Constitutional: Positive for activity change.  Gastrointestinal: Positive for anal bleeding.  All other systems reviewed and are negative.   Physical Exam Updated Vital Signs BP (!) 102/57 (BP Location: Left Arm)   Pulse 65   Temp 98.4 F (36.9 C) (Oral)   Resp 16   SpO2 98%   Physical Exam Vitals and nursing note reviewed.  Constitutional:      Appearance: He is well-developed.  HENT:     Head: Normocephalic and atraumatic.     Right Ear: External ear normal.     Left Ear: External ear normal.     Nose: Nose normal.     Mouth/Throat:     Mouth:  Mucous membranes are dry.  Eyes:     Pupils: Pupils are equal, round, and reactive to light.     Comments: Conjunctive a pale  Cardiovascular:     Rate and Rhythm: Normal rate and regular rhythm.     Heart sounds: Normal heart sounds.  Pulmonary:     Effort: Pulmonary effort is normal. No respiratory distress.     Breath sounds: Normal breath sounds. No wheezing.  Chest:     Chest wall: No tenderness.  Abdominal:     General: Bowel sounds are normal. There is no distension.     Palpations: Abdomen is soft. There is no mass.     Tenderness: There is no abdominal tenderness. There is no  guarding.  Genitourinary:    Rectum: Guaiac result positive.     Comments: Dark stool noted on digital exam Musculoskeletal:        General: Normal range of motion.     Cervical back: Normal range of motion and neck supple.  Skin:    General: Skin is warm and dry.     Capillary Refill: Capillary refill takes less than 2 seconds.  Neurological:     Mental Status: He is alert and oriented to person, place, and time.     Motor: No abnormal muscle tone.     Coordination: Coordination normal.     Deep Tendon Reflexes: Reflexes are normal and symmetric.  Psychiatric:        Behavior: Behavior normal.        Thought Content: Thought content normal.        Judgment: Judgment normal.     ED Results / Procedures / Treatments   Labs (all labs ordered are listed, but only abnormal results are displayed) Labs Reviewed - No data to display  EKG EKG Interpretation  Date/Time:  Tuesday January 28 2021 10:35:07 EDT Ventricular Rate:  84 PR Interval:  192 QRS Duration: 100 QT Interval:  375 QTC Calculation: 444 R Axis:   63 Text Interpretation: Sinus rhythm Low voltage, precordial leads Confirmed by Pattricia Boss [phone]) on 01/28/2021 12:18:50 PM   Radiology DG Chest Port 1 View  Result Date: 01/28/2021 CLINICAL DATA:  Weakness EXAM: PORTABLE CHEST 1 VIEW COMPARISON:  01/05/2021 FINDINGS: New  right-sided PICC line is noted with the catheter tip in the mid superior vena cava. Cardiac shadow is stable. Changes of prior heart surgery are noted. Lungs are clear bilaterally. No bony abnormality is seen. IMPRESSION: No active disease. Electronically Signed   By: Inez Catalina M.D.   On: 01/28/2021 10:48    Procedures .Critical Care Performed by: Pattricia Boss, MD Authorized by: Pattricia Boss, MD   Critical care provider statement:    Critical care time (minutes):  60   Critical care was time spent personally by me on the following activities:  Discussions with consultants, evaluation of patient's response to treatment, examination of patient, ordering and performing treatments and interventions, ordering and review of laboratory studies, ordering and review of radiographic studies, pulse oximetry, re-evaluation of patient's condition, obtaining history from patient or surrogate and review of old charts     Medications Ordered in ED Medications - No data to display  ED Course  I have reviewed the triage vital signs and the nursing notes.  Pertinent labs & imaging results that were available during my care of the patient were reviewed by me and considered in my medical decision making (see chart for details).    MDM Rules/Calculators/A&P                          65 year old man with recent admission for embolic strokes, history of paroxysmal A. fib, on Eliquis, history of GI bleed presents today with hemoglobin of 6.  Hemoccult is positive.  Patient was previously seen with an upper endoscopy done by [facility] that did not show any evidence of acute bleeding.  Here in the ED his hemoglobin is 6.  Initial blood pressure 102/57.  Heart rate is normal but suspect suspect this is due to beta-blocker use however he is now 82/51.  He continues to have a normal heart rate.  Packed red blood cells had been ordered but are  pending administration. Patient states [facility] keeps doing things to make  money off him.  He states he wanted to go to [facility]  Patient advised that he can pursue this after being stabilized, however, transferring with low hgb and low blood pressure is not safe. Care discussed with Dr. Neysa Bonito who will see for admission Final Clinical Impression(s) / ED Diagnoses Final diagnoses:  Gastrointestinal hemorrhage, unspecified gastrointestinal hemorrhage type  Anemia, unspecified type    Rx / DC Orders ED Discharge Orders    None       Pattricia Boss, MD 01/29/21 1036

## 2021-01-28 NOTE — H&P (Addendum)
History and Physical        Hospital Admission Note Date: 01/28/2021  Patient name: Donald Harrington Medical record number: [id number] Date of birth: 03-10-1956 Age: 65 y.o. Gender: male  PCP: Patient, No Pcp Per (Inactive)   Chief Complaint    No chief complaint on file.     HPI:   This is a 64 year old male with past medical history of endocarditis in 4098 complicated by MI, embolic stroke s/p AVR and MVR in 2015 which are bioprosthetic, paroxysmal A. fib on Eliquis, PUD, GI bleed with multiple recent hospitalizations including UGI bleed, acute multifocal embolism and sepsis due to strep bacteremia in the setting of periodontal disease who presents from [facility] with a bloody BM and abnormal lab work with Hb 6 at his SNF.  He was recently switched from Coumadin to Eliquis at his last hospitalization and his last dose was yesterday evening.  Currently, patient denies any complaints of abdominal pain, shortness of breath, chest pain or recurrent bloody BM and says he feels fine.  Endoscopy 01/05/2021: Erosive gastropathy with no bleeding and no stigmata of recent bleeding, nonbleeding duodenal ulcer without stigmata of bleeding likely due to NSAIDs and steroids.  Started on PPI twice daily and okay to restart anticoagulation (Coumadin at the time).  A colonoscopy was recommended however not done due to poor prep  ED Course: Afebrile, hypotensive, on room air. Notable Labs: BUN 23, creatinine 1.28, WBC 4.4, Hb 6.1, platelets 198, INR 1.3, FOBT positive, COVID-19 and flu negative. Notable Imaging: CXR negative. Patient received 500 cc NS bolus and 1 unit PRBCs ordered. GI consulted by the ED provider.   Vitals:   01/28/21 1600 01/28/21 1614  BP: (!) 98/51 101/60  Pulse: 70 (!) 52  Resp: 19 11  Temp: 98.4 F (36.9 C) 98.4 F (36.9 C)  SpO2: 99% (!) 85%     Review of Systems:   Review of Systems  All other systems reviewed and are negative.   Medical/Social/Family History   Past Medical History: Past Medical History:  Diagnosis Date  . Alcohol abuse   . Bladder cancer ([place])    remission s/p TURBT  . CAD (coronary artery disease), native coronary artery    MI in setting of endocarditis  . 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 Endocarditis    Mitral and Aortic valve 2015  . HTN (hypertension)   . Hypothyroidism   . NICM (nonischemic cardiomyopathy) ([place])    Normal EF as of 2020 echo  . PAF (paroxysmal atrial fibrillation) (HCC)    coumadin anticoagulation due to h/o valve replacements with bioprosthetic valves  . PUD (peptic ulcer disease)    causing GIB in 2015    Past Surgical History:  Procedure Laterality Date  . AORTIC VALVE REPLACEMENT  04/2014   bioprosthetic  . BIOPSY  01/05/2021   Procedure: BIOPSY;  Surgeon: Thornton Park, MD;  Location: [place];  Service: Gastroenterology;;  . ESOPHAGOGASTRODUODENOSCOPY (EGD) WITH PROPOFOL N/A 01/05/2021   Procedure: ESOPHAGOGASTRODUODENOSCOPY (EGD) WITH PROPOFOL;  Surgeon: Thornton Park, MD;  Location: [place];  Service: Gastroenterology;  Laterality: N/A;  . LAPAROSCOPIC APPENDECTOMY  2021  . MITRAL VALVE REPLACEMENT  04/2014  bioprosthetic  . TEE WITHOUT CARDIOVERSION N/A 01/08/2021   Procedure: TRANSESOPHAGEAL ECHOCARDIOGRAM (TEE);  Surgeon: Acie Fredrickson Wonda Cheng, MD;  Location: [facility] ENDOSCOPY;  Service: Cardiovascular;  Laterality: N/A;  . TRANSURETHRAL RESECTION OF BLADDER TUMOR      Medications: Prior to Admission medications   Medication Sig Start Date End Date Taking? Authorizing Provider  apixaban (ELIQUIS) 5 MG TABS tablet Take 1 tablet (5 mg total) by mouth 2 (two) times daily. 01/13/21  Yes [name], MD  cyclobenzaprine (FLEXERIL) 10 MG tablet Take 10 mg by mouth daily as needed for muscle spasms. 12/01/20  Yes [provider]  EUTHYROX 25 MCG  tablet Take 25 mcg by mouth daily. 10/28/20  Yes [provider]  furosemide (LASIX) 20 MG tablet Take 40 mg by mouth daily. 01/30/20  Yes [provider]  lovastatin (MEVACOR) 20 MG tablet Take 20 mg by mouth at bedtime. 11/25/20  Yes [provider]  melatonin 5 MG TABS Take 1 tablet (5 mg total) by mouth at bedtime. 01/13/21  Yes [name], MD  metoprolol succinate (TOPROL-XL) 50 MG 24 hr tablet Take 50 mg by mouth daily. 10/28/20  Yes [provider]  pantoprazole (PROTONIX) 40 MG tablet Take 1 tablet (40 mg total) by mouth 2 (two) times daily for 28 days. 01/14/21 02/11/21 Yes [name], MD  penicillin G potassium 12 Million Units in dextrose 5 % 500 mL Inject 12 Million Units into the vein every 12 (twelve) hours. 01/13/21  Yes [name], MD  thiamine 250 MG tablet Take 250 mg by mouth daily.   Yes [provider]  gentamicin 200 mg in dextrose 5 % 50 mL Inject 200 mg into the vein daily. Patient not taking: No sig reported 01/13/21   [name], MD  penicillin G IVPB Inject 24 Million Units into the vein daily. Indication:  PV endocarditis  First Dose: No Last Day of Therapy:  02/17/2021 Labs - Once weekly:  CBC/D and BMP, Labs - Every other week:  ESR and CRP Method of administration: Elastomeric (Continuous infusion) Method of administration may be changed at the discretion of home infusion pharmacist based upon assessment of the patient and/or caregiver's ability to self-administer the medication ordered. Patient not taking: No sig reported 01/14/21 02/18/21  [name], MD    Allergies:  No Known Allergies  Social History:  reports that he has been smoking. He has a 40.00 pack-year smoking history. He has never used smokeless tobacco. He reports previous alcohol use. He reports that he does not use drugs.  Family History: Family History  Problem Relation Age of Onset  . Thyroid disease Sister       Objective   Physical Exam: Blood pressure 101/60, pulse (!) 52, temperature 98.4 F (36.9 C), temperature source Oral, resp. rate 11, height _0  (1.778 m), weight 73.3 kg, SpO2 (!) 85 %.  Physical Exam Vitals and nursing note reviewed.  Constitutional:      Appearance: Normal appearance.  HENT:     Head: Normocephalic and atraumatic.  Eyes:     Conjunctiva/sclera: Conjunctivae normal.  Cardiovascular:     Rate and Rhythm: Normal rate and regular rhythm.  Pulmonary:     Effort: Pulmonary effort is normal.     Breath sounds: Normal breath sounds.  Abdominal:     General: Abdomen is flat.     Palpations: Abdomen is soft.  Musculoskeletal:        General: No  swelling or tenderness.  Skin:    Coloration: Skin is pale. Skin is not jaundiced.  Neurological:     Mental Status: He is alert. Mental status is at baseline.  Psychiatric:        Mood and Affect: Mood normal.        Behavior: Behavior normal.     LABS on Admission: I have personally reviewed all the labs and imaging below    Basic Metabolic Panel: Recent Labs  Lab 01/28/21 1035  NA 141  K 3.8  CL 107  CO2 26  GLUCOSE 109*  BUN 23  CREATININE 1.28*  CALCIUM 8.4*   Liver Function Tests: Recent Labs  Lab 01/28/21 1035  AST 11*  ALT 10  ALKPHOS 43  BILITOT 0.5  PROT 5.8*  ALBUMIN 2.6*   No results for input(s): LIPASE, AMYLASE in the last 168 hours. No results for input(s): AMMONIA in the last 168 hours. CBC: Recent Labs  Lab 01/28/21 1035  WBC 4.4  HGB 6.1*  HCT 19.5*  MCV 107.1*  PLT 198   Cardiac Enzymes: No results for input(s): CKTOTAL, CKMB, CKMBINDEX, TROPONINI in the last 168 hours. BNP: Invalid input(s): POCBNP CBG: No results for input(s): GLUCAP in the last 168 hours.  Radiological Exams on Admission:  DG Chest Port 1 View  Result Date: 01/28/2021 CLINICAL DATA:  Weakness EXAM: PORTABLE CHEST 1 VIEW COMPARISON:  01/05/2021 FINDINGS: New right-sided PICC line is  noted with the catheter tip in the mid superior vena cava. Cardiac shadow is stable. Changes of prior heart surgery are noted. Lungs are clear bilaterally. No bony abnormality is seen. IMPRESSION: No active disease. Electronically Signed   By: Inez Catalina M.D.   On: 01/28/2021 10:48      EKG: normal EKG, normal sinus rhythm, T wave inversions lead I   A & P   Principal Problem:   Acute blood loss anemia Active Problems:   GI bleed   PAF (paroxysmal atrial fibrillation) (HCC)   History of aortic valve replacement   History of mitral valve replacement   Bacteremia due to Streptococcus   Hypotension   1. Acute blood loss anemia suspect secondary to recurrent GI bleed in the setting of Eliquis a. Hb 6 on arrival with intermittent hypotension b. Continue blood transfusion c. Trend H/h and transfuse for symptomatic or Hb<7.0 d. Protonix 40 mg IV twice daily e. Continue IV fluids f. Hold Eliquis g. N.p.o. h. GI consulted  2. Paroxysmal Afib a. Holding beta blocker due to hypotension b. Holding eliquis  3. Hypotension likely due to blood loss anemia a. Continue with blood transfusion and holding antihypertensives  4. 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 Thought to be due to dental abscess/periodontal disease b. Diagnosed at his last hospitalization and discharged on 01/13/21 with Gentamicin and Penicillin IV x 2 weeks followed by 4 additional weeks of Penicillin - continue Penicillin  5. Multifocal Embolic CVA a. Noted at his last hospitalization while subtherapeutic INR with history of valvular afib on coumadin and was switched to Eliquis b. Holding Eliquis currently for GI bleed - monitor closely  6. History of Aortic and Mitral valve bioprosthetic valve replacements in the setting of afib  a. Holding Eliquis b. Discussed anticoagulation with Dr. Gasper Sells who mentioned that the patient follows at [facility] for his cardiology and  will have to follow up with them regarding anticoagulation at discharge however since he has failed coumadin and now bleeding on Eliquis he may  need a left atrial appendage occlusion or discuss other options and he is more than welcome to follow up with [facility] if he wishes.   DVT prophylaxis: SCDs   Code Status: Full Code  Diet: CLD and NPO after midnight Family Communication: Admission, patients condition and plan of care including tests being ordered have been discussed with the patient who indicates understanding and agrees with the plan and Code Status. Disposition Plan: The appropriate patient status for this patient is INPATIENT. Inpatient status is judged to be reasonable and necessary in order to provide the required intensity of service to ensure the patient's safety. The patient's presenting symptoms, physical exam findings, and initial radiographic and laboratory data in the context of their chronic comorbidities is felt to place them at high risk for further clinical deterioration. Furthermore, it is not anticipated that the patient will be medically stable for discharge from the hospital within 2 midnights of admission. The following factors support the patient status of inpatient.   " The patient's presenting symptoms include abnormal lab work, GI bleed. " The worrisome physical exam findings include palor. " The initial radiographic and laboratory data are worrisome because of Hb 6. " The chronic co-morbidities include afib, embolic stroke, bioprosthetic valves, bacteremia.   * I certify that at the point of admission it is my clinical judgment that the patient will require inpatient hospital care spanning beyond 2 midnights from the point of admission due to high intensity of service, high risk for further deterioration and high frequency of surveillance required.*   Status is: Inpatient  Remains inpatient appropriate because:Ongoing diagnostic testing needed not appropriate for  outpatient work up, IV treatments appropriate due to intensity of illness or inability to take PO and Inpatient level of care appropriate due to severity of illness   Dispo: The patient is from: SNF              Anticipated d/c is to: SNF              Patient currently is not medically stable to d/c.   Difficult to place patient No         The medical decision making on this patient was of high complexity and the patient is at high risk for clinical deterioration, therefore this is a level 3  admission.  Consultants  . GI  Procedures  . 1 u PRBCs  Time Spent on Admission: 74 minutes    Harold Hedge, DO [facility]  01/28/2021, 5:19 PM

## 2021-01-29 LAB — CBC
HCT: 21.8 % — ABNORMAL LOW (ref 39.0–52.0)
Hemoglobin: 7.3 g/dL — ABNORMAL LOW (ref 13.0–17.0)
MCH: 34.4 pg — ABNORMAL HIGH (ref 26.0–34.0)
MCHC: 33.5 g/dL (ref 30.0–36.0)
MCV: 102.8 fL — ABNORMAL HIGH (ref 80.0–100.0)
Platelets: 242 10*3/uL (ref 150–400)
RBC: 2.12 MIL/uL — ABNORMAL LOW (ref 4.22–5.81)
RDW: 15.3 % (ref 11.5–15.5)
WBC: 5.3 10*3/uL (ref 4.0–10.5)
nRBC: 0 % (ref 0.0–0.2)

## 2021-01-29 LAB — BPAM RBC
Blood Product Expiration Date: 202205102359
ISSUE DATE / TIME: 202204121317
Unit Type and Rh: 5100

## 2021-01-29 LAB — HEMOGLOBIN AND HEMATOCRIT, BLOOD
HCT: 24.7 % — ABNORMAL LOW (ref 39.0–52.0)
Hemoglobin: 8.1 g/dL — ABNORMAL LOW (ref 13.0–17.0)

## 2021-01-29 LAB — TYPE AND SCREEN
ABO/RH(D): O POS
Antibody Screen: NEGATIVE
Unit division: 0

## 2021-01-29 LAB — BASIC METABOLIC PANEL
Anion gap: 8 (ref 5–15)
BUN: 18 mg/dL (ref 8–23)
CO2: 27 mmol/L (ref 22–32)
Calcium: 8.2 mg/dL — ABNORMAL LOW (ref 8.9–10.3)
Chloride: 100 mmol/L (ref 98–111)
Creatinine, Ser: 1.37 mg/dL — ABNORMAL HIGH (ref 0.61–1.24)
GFR, Estimated: 57 mL/min — ABNORMAL LOW (ref >60)
Glucose, Bld: 96 mg/dL (ref 70–99)
Potassium: 3.6 mmol/L (ref 3.5–5.1)
Sodium: 135 mmol/L (ref 135–145)

## 2021-01-29 MED ORDER — TRAMADOL HCL 50 MG PO TABS
50.0000 mg | ORAL_TABLET | Freq: Four times a day (QID) | ORAL | Status: DC | PRN
  Administered 2021-01-29 – 2021-02-06 (×4): 50 mg via ORAL
  Filled 2021-01-29 (×4): qty 1

## 2021-01-29 NOTE — Progress Notes (Signed)
UNASSIGNED PATIENT Subjective: Events since last admission noted.  Patient had a large BM today which was somewhat dark but there was no evidence of bright red blood in the stool as per his nurse.  He is doing his prep for his colonoscopy tomorrow. He denies having any abdominal pain nausea vomiting. His hemoglobin is 7.6 after 1 unit of packed red blood cells Objective: Vital signs in last 24 hours: Temp:  [97.3 F (36.3 C)-98.3 F (36.8 C)] 98 F (36.7 C) (04/13 1600) Pulse Rate:  [61-82] 78 (04/13 0415) Resp:  [13-22] 13 (04/13 0415) BP: (82-130)/(34-111) 97/60 (04/13 0415) SpO2:  [94 %-100 %] 95 % (04/13 0415) Last BM Date: 01/28/21  Intake/Output from previous day: 04/12 0701 - 04/13 0700 In: 875.1 [I.V.:469.5; Blood:350; IV Piggyback:55.6] Out: 1250 [Urine:1250] Intake/Output this shift: Total I/O In: 1735.3 [P.O.:1080; I.V.:5.5; IV Piggyback:649.8] Out: 1175 [Urine:1175]  General appearance: alert, cooperative, appears stated age, no distress and pale Resp: diminished breath sounds bibasilar Cardio: regular rate and rhythm, S1, S2 normal, no murmur, click, rub or gallop GI: soft, non-tender; bowel sounds normal; no masses,  no organomegaly Extremities: extremities normal, atraumatic, no cyanosis or edema  Lab Results: Recent Labs    01/28/21 1035 01/28/21 1808 01/28/21 2203 01/29/21 0254 01/29/21 1428  WBC 4.4  --   --  5.3  --   HGB 6.1*   < > 7.6* 7.3* 8.1*  HCT 19.5*   < > 23.6* 21.8* 24.7*  PLT 198  --   --  242  --    < > = values in this interval not displayed.   BMET Recent Labs    01/28/21 1035 01/29/21 0254  NA 141 135  K 3.8 3.6  CL 107 100  CO2 26 27  GLUCOSE 109* 96  BUN 23 18  CREATININE 1.28* 1.37*  CALCIUM 8.4* 8.2*   LFT Recent Labs    01/28/21 1035  PROT 5.8*  ALBUMIN 2.6*  AST 11*  ALT 10  ALKPHOS 43  BILITOT 0.5   PT/INR Recent Labs    01/28/21 1035  LABPROT 15.9*  INR 1.3*   Hepatitis Panel No results for  input(s): HEPBSAG, HCVAB, HEPAIGM, HEPBIGM in the last 72 hours. C-Diff No results for input(s): CDIFFTOX in the last 72 hours. No results for input(s): CDIFFPCR in the last 72 hours. Fecal Lactopherrin No results for input(s): FECLLACTOFRN in the last 72 hours.  Studies/Results: DG Chest Port 1 View  Result Date: 01/28/2021 CLINICAL DATA:  Weakness EXAM: PORTABLE CHEST 1 VIEW COMPARISON:  01/05/2021 FINDINGS: New right-sided PICC line is noted with the catheter tip in the mid superior vena cava. Cardiac shadow is stable. Changes of prior heart surgery are noted. Lungs are clear bilaterally. No bony abnormality is seen. IMPRESSION: No active disease. Electronically Signed   By: Inez Catalina M.D.   On: 01/28/2021 10:48   Medications: I have reviewed the patient's current medications.  Assessment/Plan: )  Blood in stool with posthemorrhagic anemia with a history of duodenal ulcers with erosive gastropathy on recent EGD in the setting of atrial fibrillation; colonoscopy has been planned for tomorrow.  Further recommendation made once has been done. 2) Acute multifocal embolic strokes. 3) Sepsis due to streptococcal bacteremia. 4) HTN    LOS: 1 day   Juanita Craver 01/29/2021, 4:55 PM

## 2021-01-29 NOTE — TOC Initial Note (Signed)
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 fib on Eliquis, PUD, GI bleed with multiple recent hospitalizations including UGI bleed, acute multifocal embolism and sepsis due to strep bacteremia in the setting of periodontal disease who presents from [facility] with a bloody BM and abnormal lab work with Hb 6 at his SNF.  He was recently switched from Coumadin to Eliquis at his last hospitalization and his last dose was yesterday evening.  Currently, patient denies any complaints of abdominal pain, shortness of breath, chest pain or recurrent bloody BM and says he feels fine.  Endoscopy 01/05/2021: Erosive gastropathy with no bleeding and no stigmata of recent bleeding, nonbleeding duodenal ulcer without stigmata of bleeding likely due to NSAIDs and steroids.  Started on PPI twice daily and okay to restart anticoagulation (Coumadin at the time).  A colonoscopy was recommended however not done due to poor prep  ED Course: Afebrile, hypotensive, on room air. Notable Labs: BUN 23, creatinine 1.28, WBC 4.4, Hb 6.1, platelets 198, INR 1.3, FOBT positive, COVID-19 and flu negative. Notable Imaging: CXR negative. Patient received 500 cc NS bolus and 1 unit PRBCs ordered. GI consulted by the ED provider. PLAN: [facility] [place] Expected Discharge Plan: Skilled Nursing Facility Barriers to Discharge: Continued Medical Work up   Patient Goals and CMS Choice Patient states their goals for this hospitalization and ongoing recovery are:: i do not have any CMS Medicare.gov Compare Post Acute Care list provided to::  Patient Choice offered to / list presented to : Patient  Expected Discharge Plan and Services Expected Discharge Plan: [place]   Discharge Planning Services: CM Consult Post Acute Care Choice: [place] Living arrangements for the past 2 months: Apartment Expected Discharge Date:  (unknown)                                    Prior Living Arrangements/Services Living arrangements for the past 2 months: Apartment Lives with:: Self Patient language and need for interpreter reviewed:: Yes Do you feel safe going back to the place where you live?: Yes      Need for Family Participation in Patient Care: Yes (Comment) Care giver support system in place?: Yes (comment)   Criminal Activity/Legal Involvement Pertinent to Current Situation/Hospitalization: No - Comment as needed  Activities of Daily Living Home Assistive Devices/Equipment: Eyeglasses,Cane (specify quad or straight) (single point cane) ADL Screening (condition at time of admission) Patient's cognitive ability adequate to safely complete daily activities?: Yes Is the patient deaf or have difficulty hearing?: No Does the patient have difficulty seeing, even when wearing glasses/contacts?: No Does the patient have difficulty concentrating, remembering, or making decisions?: No Patient able to express need for assistance with ADLs?: Yes Does the patient have difficulty dressing or bathing?: No Independently performs ADLs?: No Communication: Independent Dressing (OT): Needs assistance Is this a change from baseline?: Pre-admission baseline Grooming: Needs assistance Is this a change from baseline?: Pre-admission baseline Feeding: Needs  assistance Is this a change from baseline?: Pre-admission baseline Bathing: Needs assistance Is this a change from baseline?: Pre-admission baseline Toileting: Needs assistance Is this a change from baseline?: Pre-admission baseline In/Out Bed: Needs  assistance Is this a change from baseline?: Pre-admission baseline Walks in Home: Needs assistance Is this a change from baseline?: Pre-admission baseline Does the patient have difficulty walking or climbing stairs?: Yes (secondary to weakness) Weakness of Legs: Both Weakness of Arms/Hands: None  Permission Sought/Granted                  Emotional Assessment Appearance:: Appears stated age Attitude/Demeanor/Rapport: Engaged Affect (typically observed): Calm Orientation: : Oriented to Place,Oriented to Self,Oriented to  Time,Oriented to Situation Alcohol / Substance Use: Alcohol Use,Tobacco Use Psych Involvement: No (comment)  Admission diagnosis:  GI bleed [K92.2] Patient Active Problem List   Diagnosis Date Noted  . Hypotension 01/28/2021  . Dental caries   . Chronic apical periodontitis   . Chronic periodontal disease   . Bacteremia due to Streptococcus   . Duodenal ulcer   . Acute gastric erosion   . Acute embolic stroke ([place]) 16/07/9603  . GI bleed 01/03/2021  . Acute blood loss anemia 01/03/2021  . Cough 01/03/2021  . PAF (paroxysmal atrial fibrillation) ([place]) 01/03/2021  . History of aortic valve replacement 01/03/2021  . History of mitral valve replacement 01/03/2021   PCP:  Patient, No Pcp Per (Inactive) Pharmacy:   [facility] [address], [place] [address] Phone: [phone] Fax: [phone]     Social Determinants of Health (SDOH) Interventions    Readmission Risk Interventions No flowsheet data found.

## 2021-01-29 NOTE — Progress Notes (Signed)
PROGRESS NOTE  Donald Harrington  SWH:[id number] DOB: 02/16/1956 DOA: 01/28/2021 PCP: Patient, No Pcp Per (Inactive)   Brief Narrative: Donald Harrington is a 65 y.o. male with a history of endocarditis in 6659 complicated by MI, embolic stroke s/p bioprosthetic AVR and MVR, PAF on eliquis, PUD, GI bleed with multiple recent hospitalizations including UGI bleed, acute multifocal embolism and sepsis due to strep bacteremia in the setting of periodontal disease who presents from [facility] with a bloody BM and abnormal lab work with Hb 6. In the ED he was afebrile, hypotensive with Hgb 6.1, SCr 1.28, BUN 23, +FOBT, INR 1.3. 1u PRBCs and IVF ordered and pt admitted to SDU with GI recommending full day prep 4/13 with colonoscopy 4/14.   Endoscopy 01/05/2021: Erosive gastropathy with no bleeding and no stigmata of recent bleeding, nonbleeding duodenal ulcer without stigmata of bleeding likely due to NSAIDs and steroids.  Started on PPI twice daily and okay to restart anticoagulation (Coumadin at the time).  A colonoscopy was recommended however not done due to poor prep  Assessment & Plan: Principal Problem:   Acute blood loss anemia Active Problems:   GI bleed   PAF (paroxysmal atrial fibrillation) (HCC)   History of aortic valve replacement   History of mitral valve replacement   Bacteremia due to Streptococcus   Hypotension  Acute blood loss anemia due to recurrent GI bleeding:  - s/p 1u PRBC 4/12, hgb improved but low. Will recheck this PM with transfusion threshold 7g/dl or 8g/dl if hypotension recurs.  - Continue empiric PPI IV - Hold anticoagulation - Clears today, NPO p MN per GI with plans for colonoscopy 4/14.   PAF, History of bioprosthetic AVR, MVR:  - Restart beta blocker once BP normalized - Hold eliquis. May consider LAA clipping since fa  Hypotension: Due to anemia.  - RBCs/fluids prn. Would prefer he remain in SDU for now.   Multifocal embolic CVA: Precipitated by subtherapeutic  INR. - Restart anticoagulation as soon as possible.  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 Diagnosed at his last hospitalization and discharged on 01/13/21 with Gentamicin and Penicillin IV x 2 weeks followed by 4 additional weeks of Penicillin.  - Continue penicillin   DVT prophylaxis: SCDs Code Status: Full Family Communication: None at bedside Disposition Plan:  Status is: Inpatient  Remains inpatient appropriate because:Hemodynamically unstable and Ongoing diagnostic testing needed not appropriate for outpatient work up   Dispo: The patient is from: SNF              Anticipated d/c is to: SNF              Patient currently is not medically stable to d/c.   Difficult to place patient No  Consultants:   GI  Procedures:   Colonoscopy planned 4/14  Antimicrobials:  Penicillin   Subjective: Has head/neck ache which is chronic. Had BM this morning but is unable to say whether there was blood there or not. No abdominal pain, chest pain, shortness of breath. No palpitations. No new weakness or numbness.  Objective: Vitals:   01/29/21 0200 01/29/21 0320 01/29/21 0415 01/29/21 0834  BP: (!) 106/53 (!) 87/46 97/60   Pulse: 76 67 78   Resp: 17 14 13    Temp:   98.1 F (36.7 C) (!) 97.3 F (36.3 C)  TempSrc:   Oral Oral  SpO2: 97% 95% 95%   Weight:  Height:        Intake/Output Summary (Last 24 hours) at 01/29/2021 1439 Last data filed at 01/29/2021 1335 Gross per 24 hour  Intake 2610.42 ml  Output 2425 ml  Net 185.42 ml   Filed Weights   01/28/21 1600  Weight: 73.3 kg    Gen: 65 y.o. male in no distress Pulm: Non-labored breathing. Clear to auscultation bilaterally.  CV: Regular rate and rhythm. No murmur, rub, or gallop. No JVD, no pitting pedal edema. GI: Abdomen soft, non-tender, non-distended, with normoactive bowel sounds. No  organomegaly or masses felt. Ext: Warm, no deformities Skin: No rashes, lesions or ulcers. RUA IV site c/d/i. Neuro: Alert and oriented. No new focal neurological deficits. Psych: Judgement and insight appear normal. Mood & affect appropriate.   Data Reviewed: I have personally reviewed following labs and imaging studies  CBC: Recent Labs  Lab 01/28/21 1035 01/28/21 1808 01/28/21 2203 01/29/21 0254  WBC 4.4  --   --  5.3  HGB 6.1* 7.4* 7.6* 7.3*  HCT 19.5* 23.0* 23.6* 21.8*  MCV 107.1*  --   --  102.8*  PLT 198  --   --  119   Basic Metabolic Panel: Recent Labs  Lab 01/28/21 1035 01/29/21 0254  NA 141 135  K 3.8 3.6  CL 107 100  CO2 26 27  GLUCOSE 109* 96  BUN 23 18  CREATININE 1.28* 1.37*  CALCIUM 8.4* 8.2*   GFR: Estimated Creatinine Clearance: 54.8 mL/min (A) (by C-G formula based on SCr of 1.37 mg/dL (H)). Liver Function Tests: Recent Labs  Lab 01/28/21 1035  AST 11*  ALT 10  ALKPHOS 43  BILITOT 0.5  PROT 5.8*  ALBUMIN 2.6*   No results for input(s): LIPASE, AMYLASE in the last 168 hours. No results for input(s): AMMONIA in the last 168 hours. Coagulation Profile: Recent Labs  Lab 01/28/21 1035  INR 1.3*   Cardiac Enzymes: No results for input(s): CKTOTAL, CKMB, CKMBINDEX, TROPONINI in the last 168 hours. BNP (last 3 results) No results for input(s): PROBNP in the last 8760 hours. HbA1C: No results for input(s): HGBA1C in the last 72 hours. CBG: No results for input(s): GLUCAP in the last 168 hours. Lipid Profile: No results for input(s): CHOL, HDL, LDLCALC, TRIG, CHOLHDL, LDLDIRECT in the last 72 hours. Thyroid Function Tests: No results for input(s): TSH, T4TOTAL, FREET4, T3FREE, THYROIDAB in the last 72 hours. Anemia Panel: No results for input(s): VITAMINB12, FOLATE, FERRITIN, TIBC, IRON, RETICCTPCT in the last 72 hours. Urine analysis:    Component Value Date/Time   COLORURINE YELLOW 01/04/2021 1729   APPEARANCEUR CLEAR 01/04/2021  1729   LABSPEC 1.012 01/04/2021 1729   PHURINE 5.0 01/04/2021 1729   GLUCOSEU NEGATIVE 01/04/2021 1729   HGBUR MODERATE (A) 01/04/2021 1729   BILIRUBINUR NEGATIVE 01/04/2021 1729   KETONESUR NEGATIVE 01/04/2021 1729   PROTEINUR NEGATIVE 01/04/2021 1729   NITRITE NEGATIVE 01/04/2021 1729   LEUKOCYTESUR NEGATIVE 01/04/2021 1729   Recent Results (from the past 240 hour(s))  Resp Panel by RT-PCR (Flu A&B, Covid) Nasopharyngeal Swab     Status: None   Collection Time: 01/20/21  3:16 PM   Specimen: Nasopharyngeal Swab; Nasopharyngeal(NP) swabs in vial transport medium  Result Value Ref Range Status   SARS Coronavirus 2 by RT PCR NEGATIVE NEGATIVE Final    Comment: (NOTE) SARS-CoV-2 target nucleic acids are NOT DETECTED.  The SARS-CoV-2 RNA is generally detectable in upper respiratory specimens during the acute phase of infection. The lowest concentration of  SARS-CoV-2 viral copies this assay can detect is 138 copies/mL. A negative result does not preclude SARS-Cov-2 infection and should not be used as the sole basis for treatment or other patient management decisions. A negative result may occur with  improper specimen collection/handling, submission of specimen other than nasopharyngeal swab, presence of viral mutation(s) within the areas targeted by this assay, and inadequate number of viral copies(<138 copies/mL). A negative result must be combined with clinical observations, patient history, and epidemiological information. The expected result is Negative.  Fact Sheet for Patients:  EntrepreneurPulse.com.au  Fact Sheet for Healthcare Providers:  IncredibleEmployment.be  This test is no t yet approved or cleared by the [place] FDA and  has been authorized for detection and/or diagnosis of SARS-CoV-2 by FDA under an Emergency Use Authorization (EUA). This EUA will remain  in effect (meaning this test can be used) for the duration of  the COVID-19 declaration under Section 564(b)(1) of the Act, 21 U.S.C.section 360bbb-3(b)(1), unless the authorization is terminated  or revoked sooner.       Influenza A by PCR NEGATIVE NEGATIVE Final   Influenza B by PCR NEGATIVE NEGATIVE Final    Comment: (NOTE) The Xpert Xpress SARS-CoV-2/FLU/RSV plus assay is intended as an aid in the diagnosis of influenza from Nasopharyngeal swab specimens and should not be used as a sole basis for treatment. Nasal washings and aspirates are unacceptable for Xpert Xpress SARS-CoV-2/FLU/RSV testing.  Fact Sheet for Patients: EntrepreneurPulse.com.au  Fact Sheet for Healthcare Providers: IncredibleEmployment.be  This test is not yet approved or cleared by the [place] FDA and has been authorized for detection and/or diagnosis of SARS-CoV-2 by FDA under an Emergency Use Authorization (EUA). This EUA will remain in effect (meaning this test can be used) for the duration of the COVID-19 declaration under Section 564(b)(1) of the Act, 21 U.S.C. section 360bbb-3(b)(1), unless the authorization is terminated or revoked.  Performed at [facility], [place] [address], [place], [place] [address]   Respiratory (~20 pathogens) panel by PCR     Status: None   Collection Time: 01/20/21  9:20 PM   Specimen: Nasopharyngeal Swab; Respiratory  Result Value Ref Range Status   Adenovirus NOT DETECTED NOT DETECTED Final   Coronavirus 229E NOT DETECTED NOT DETECTED Final    Comment: (NOTE) The Coronavirus on the Respiratory Panel, DOES NOT test for the novel  Coronavirus (2019 nCoV)    Coronavirus HKU1 NOT DETECTED NOT DETECTED Final   Coronavirus NL63 NOT DETECTED NOT DETECTED Final   Coronavirus OC43 NOT DETECTED NOT DETECTED Final   Metapneumovirus NOT DETECTED NOT DETECTED Final   Rhinovirus / Enterovirus NOT DETECTED NOT DETECTED Final   Influenza A NOT DETECTED NOT DETECTED Final   Influenza B NOT  DETECTED NOT DETECTED Final   Parainfluenza Virus 1 NOT DETECTED NOT DETECTED Final   Parainfluenza Virus 2 NOT DETECTED NOT DETECTED Final   Parainfluenza Virus 3 NOT DETECTED NOT DETECTED Final   Parainfluenza Virus 4 NOT DETECTED NOT DETECTED Final   Respiratory Syncytial Virus NOT DETECTED NOT DETECTED Final   Bordetella pertussis NOT DETECTED NOT DETECTED Final   Bordetella Parapertussis NOT DETECTED NOT DETECTED Final   Chlamydophila pneumoniae NOT DETECTED NOT DETECTED Final   Mycoplasma pneumoniae NOT DETECTED NOT DETECTED Final    Comment: Performed at [facility], [place]. [address] [place], [place] [address]  Resp Panel by RT-PCR (Flu A&B, Covid) Nasopharyngeal Swab     Status: None   Collection Time:  01/28/21 10:27 AM   Specimen: Nasopharyngeal Swab; Nasopharyngeal(NP) swabs in vial transport medium  Result Value Ref Range Status   SARS Coronavirus 2 by RT PCR NEGATIVE NEGATIVE Final    Comment: (NOTE) SARS-CoV-2 target nucleic acids are NOT DETECTED.  The SARS-CoV-2 RNA is generally detectable in upper respiratory specimens during the acute phase of infection. The lowest concentration of SARS-CoV-2 viral copies this assay can detect is 138 copies/mL. A negative result does not preclude SARS-Cov-2 infection and should not be used as the sole basis for treatment or other patient management decisions. A negative result may occur with  improper specimen collection/handling, submission of specimen other than nasopharyngeal swab, presence of viral mutation(s) within the areas targeted by this assay, and inadequate number of viral copies(<138 copies/mL). A negative result must be combined with clinical observations, patient history, and epidemiological information. The expected result is Negative.  Fact Sheet for Patients:  EntrepreneurPulse.com.au  Fact Sheet for Healthcare Providers:  IncredibleEmployment.be  This test is no t yet  approved or cleared by the [place] FDA and  has been authorized for detection and/or diagnosis of SARS-CoV-2 by FDA under an Emergency Use Authorization (EUA). This EUA will remain  in effect (meaning this test can be used) for the duration of the COVID-19 declaration under Section 564(b)(1) of the Act, 21 U.S.C.section 360bbb-3(b)(1), unless the authorization is terminated  or revoked sooner.       Influenza A by PCR NEGATIVE NEGATIVE Final   Influenza B by PCR NEGATIVE NEGATIVE Final    Comment: (NOTE) The Xpert Xpress SARS-CoV-2/FLU/RSV plus assay is intended as an aid in the diagnosis of influenza from Nasopharyngeal swab specimens and should not be used as a sole basis for treatment. Nasal washings and aspirates are unacceptable for Xpert Xpress SARS-CoV-2/FLU/RSV testing.  Fact Sheet for Patients: EntrepreneurPulse.com.au  Fact Sheet for Healthcare Providers: IncredibleEmployment.be  This test is not yet approved or cleared by the [place] FDA and has been authorized for detection and/or diagnosis of SARS-CoV-2 by FDA under an Emergency Use Authorization (EUA). This EUA will remain in effect (meaning this test can be used) for the duration of the COVID-19 declaration under Section 564(b)(1) of the Act, 21 U.S.C. section 360bbb-3(b)(1), unless the authorization is terminated or revoked.  Performed at [facility], [place] [address], [place], [place] [address]   MRSA PCR Screening     Status: None   Collection Time: 01/28/21  3:32 PM   Specimen: Nasopharyngeal  Result Value Ref Range Status   MRSA by PCR NEGATIVE NEGATIVE Final    Comment:        The GeneXpert MRSA Assay (FDA approved for NASAL specimens only), is one component of a comprehensive MRSA colonization surveillance program. It is not intended to diagnose MRSA infection nor to guide or monitor treatment for MRSA infections. Performed at  [facility], [place] [address] [place], [place] [address]       Radiology Studies: DG Chest Port 1 View  Result Date: 01/28/2021 CLINICAL DATA:  Weakness EXAM: PORTABLE CHEST 1 VIEW COMPARISON:  01/05/2021 FINDINGS: New right-sided PICC line is noted with the catheter tip in the mid superior vena cava. Cardiac shadow is stable. Changes of prior heart surgery are noted. Lungs are clear bilaterally. No bony abnormality is seen. IMPRESSION: No active disease. Electronically Signed   By: Inez Catalina M.D.   On: 01/28/2021 10:48    Scheduled Meds: . Chlorhexidine Gluconate Cloth  6 each Topical  Daily  . levothyroxine  25 mcg Oral Daily  . mouth rinse  15 mL Mouth Rinse BID  . pantoprazole (PROTONIX) IV  40 mg Intravenous Q12H  . polyethylene glycol-electrolytes  4,000 mL Oral Once  . sodium chloride flush  3 mL Intravenous Q12H   Continuous Infusions: . sodium chloride Stopped (01/29/21 0547)  . penicillin g continuous IV infusion 12 Million Units (01/29/21 0544)     LOS: 1 day   Time spent: 35 minutes.  Patrecia Pour, MD [facility] [email or web address] 01/29/2021, 2:39 PM

## 2021-01-29 NOTE — H&P (View-Only) (Signed)
UNASSIGNED PATIENT Subjective: Events since last admission noted.  Patient had a large BM today which was somewhat dark but there was no evidence of bright red blood in the stool as per his nurse.  He is doing his prep for his colonoscopy tomorrow. He denies having any abdominal pain nausea vomiting. His hemoglobin is 7.6 after 1 unit of packed red blood cells Objective: Vital signs in last 24 hours: Temp:  [97.3 F (36.3 C)-98.3 F (36.8 C)] 98 F (36.7 C) (04/13 1600) Pulse Rate:  [61-82] 78 (04/13 0415) Resp:  [13-22] 13 (04/13 0415) BP: (82-130)/(34-111) 97/60 (04/13 0415) SpO2:  [94 %-100 %] 95 % (04/13 0415) Last BM Date: 01/28/21  Intake/Output from previous day: 04/12 0701 - 04/13 0700 In: 875.1 [I.V.:469.5; Blood:350; IV Piggyback:55.6] Out: 1250 [Urine:1250] Intake/Output this shift: Total I/O In: 1735.3 [P.O.:1080; I.V.:5.5; IV Piggyback:649.8] Out: 1175 [Urine:1175]  General appearance: alert, cooperative, appears stated age, no distress and pale Resp: diminished breath sounds bibasilar Cardio: regular rate and rhythm, S1, S2 normal, no murmur, click, rub or gallop GI: soft, non-tender; bowel sounds normal; no masses,  no organomegaly Extremities: extremities normal, atraumatic, no cyanosis or edema  Lab Results: Recent Labs    01/28/21 1035 01/28/21 1808 01/28/21 2203 01/29/21 0254 01/29/21 1428  WBC 4.4  --   --  5.3  --   HGB 6.1*   < > 7.6* 7.3* 8.1*  HCT 19.5*   < > 23.6* 21.8* 24.7*  PLT 198  --   --  242  --    < > = values in this interval not displayed.   BMET Recent Labs    01/28/21 1035 01/29/21 0254  NA 141 135  K 3.8 3.6  CL 107 100  CO2 26 27  GLUCOSE 109* 96  BUN 23 18  CREATININE 1.28* 1.37*  CALCIUM 8.4* 8.2*   LFT Recent Labs    01/28/21 1035  PROT 5.8*  ALBUMIN 2.6*  AST 11*  ALT 10  ALKPHOS 43  BILITOT 0.5   PT/INR Recent Labs    01/28/21 1035  LABPROT 15.9*  INR 1.3*   Hepatitis Panel No results for  input(s): HEPBSAG, HCVAB, HEPAIGM, HEPBIGM in the last 72 hours. C-Diff No results for input(s): CDIFFTOX in the last 72 hours. No results for input(s): CDIFFPCR in the last 72 hours. Fecal Lactopherrin No results for input(s): FECLLACTOFRN in the last 72 hours.  Studies/Results: DG Chest Port 1 View  Result Date: 01/28/2021 CLINICAL DATA:  Weakness EXAM: PORTABLE CHEST 1 VIEW COMPARISON:  01/05/2021 FINDINGS: New right-sided PICC line is noted with the catheter tip in the mid superior vena cava. Cardiac shadow is stable. Changes of prior heart surgery are noted. Lungs are clear bilaterally. No bony abnormality is seen. IMPRESSION: No active disease. Electronically Signed   By: Inez Catalina M.D.   On: 01/28/2021 10:48   Medications: I have reviewed the patient's current medications.  Assessment/Plan: )  Blood in stool with posthemorrhagic anemia with a history of duodenal ulcers with erosive gastropathy on recent EGD in the setting of atrial fibrillation; colonoscopy has been planned for tomorrow.  Further recommendation made once has been done. 2) Acute multifocal embolic strokes. 3) Sepsis due to streptococcal bacteremia. 4) HTN    LOS: 1 day   Juanita Craver 01/29/2021, 4:55 PM

## 2021-01-30 ENCOUNTER — Encounter (HOSPITAL_COMMUNITY)
Admission: EM | Disposition: A | Discharge status: Skilled Nursing Facility | Payer: Self-pay | Source: Home / Self Care | Attending: Family Medicine

## 2021-01-30 ENCOUNTER — Inpatient Hospital Stay (HOSPITAL_COMMUNITY): Payer: Medicare HMO | Admitting: Certified Registered"

## 2021-01-30 ENCOUNTER — Encounter (HOSPITAL_COMMUNITY): Payer: Self-pay | Admitting: Internal Medicine

## 2021-01-30 HISTORY — PX: COLONOSCOPY WITH PROPOFOL: SHX5780

## 2021-01-30 HISTORY — PX: ESOPHAGOGASTRODUODENOSCOPY (EGD) WITH PROPOFOL: SHX5813

## 2021-01-30 HISTORY — PX: POLYPECTOMY: SHX5525

## 2021-01-30 LAB — BASIC METABOLIC PANEL
Anion gap: 8 (ref 5–15)
BUN: 13 mg/dL (ref 8–23)
CO2: 25 mmol/L (ref 22–32)
Calcium: 8.4 mg/dL — ABNORMAL LOW (ref 8.9–10.3)
Chloride: 104 mmol/L (ref 98–111)
Creatinine, Ser: 1.13 mg/dL (ref 0.61–1.24)
GFR, Estimated: >60 mL/min (ref >60)
Glucose, Bld: 102 mg/dL — ABNORMAL HIGH (ref 70–99)
Potassium: 4.4 mmol/L (ref 3.5–5.1)
Sodium: 137 mmol/L (ref 135–145)

## 2021-01-30 LAB — CBC
HCT: 22.2 % — ABNORMAL LOW (ref 39.0–52.0)
Hemoglobin: 7.4 g/dL — ABNORMAL LOW (ref 13.0–17.0)
MCH: 34.7 pg — ABNORMAL HIGH (ref 26.0–34.0)
MCHC: 33.3 g/dL (ref 30.0–36.0)
MCV: 104.2 fL — ABNORMAL HIGH (ref 80.0–100.0)
Platelets: 212 10*3/uL (ref 150–400)
RBC: 2.13 MIL/uL — ABNORMAL LOW (ref 4.22–5.81)
RDW: 15.1 % (ref 11.5–15.5)
WBC: 6.1 10*3/uL (ref 4.0–10.5)
nRBC: 0 % (ref 0.0–0.2)

## 2021-01-30 LAB — I-STAT BETA HCG BLOOD, ED (NOT ORDERABLE)

## 2021-01-30 SURGERY — COLONOSCOPY WITH PROPOFOL
Anesthesia: Monitor Anesthesia Care

## 2021-01-30 MED ORDER — PROPOFOL 500 MG/50ML IV EMUL
INTRAVENOUS | Status: DC | PRN
  Administered 2021-01-30: 200 ug/kg/min via INTRAVENOUS

## 2021-01-30 MED ORDER — LIDOCAINE 2% (20 MG/ML) 5 ML SYRINGE
INTRAMUSCULAR | Status: DC | PRN
  Administered 2021-01-30: 20 mg via INTRAVENOUS

## 2021-01-30 MED ORDER — PHENYLEPHRINE 40 MCG/ML (10ML) SYRINGE FOR IV PUSH (FOR BLOOD PRESSURE SUPPORT)
PREFILLED_SYRINGE | INTRAVENOUS | Status: DC | PRN
  Administered 2021-01-30: 120 ug via INTRAVENOUS
  Administered 2021-01-30 (×2): 80 ug via INTRAVENOUS

## 2021-01-30 MED ORDER — PROPOFOL 10 MG/ML IV BOLUS
INTRAVENOUS | Status: DC | PRN
  Administered 2021-01-30: 40 mg via INTRAVENOUS

## 2021-01-30 SURGICAL SUPPLY — 22 items

## 2021-01-30 NOTE — Op Note (Signed)
[facility] Patient Name: Donald Harrington Procedure Date: 01/30/2021 MRN: [id number] Attending MD: Carol Ada , MD Date of Birth: [birth year] CSN: [id number] Age: 65 Admit Type: Outpatient Procedure:                Upper GI endoscopy Indications:              Hematochezia Providers:                Carol Ada, MD, Carmie End, RN, Tyna Jaksch Technician Referring MD:              Medicines:                Propofol per Anesthesia Complications:            No immediate complications. Estimated Blood Loss:     Estimated blood loss: none. Procedure:                Pre-Anesthesia Assessment:                           - Prior to the procedure, a History and Physical                            was performed, and patient medications and                            allergies were reviewed. The patient's tolerance of                            previous anesthesia was also reviewed. The risks                            and benefits of the procedure and the sedation                            options and risks were discussed with the patient.                            All questions were answered, and informed consent                            was obtained. Prior Anticoagulants: The patient has                            taken Eliquis (apixaban), last dose was 2 days                            prior to procedure. ASA Grade Assessment: III - A                            patient with severe systemic disease. After                            reviewing  the risks and benefits, the patient was                            deemed in satisfactory condition to undergo the                            procedure.                           - Sedation was administered by an anesthesia                            professional. Deep sedation was attained.                           After obtaining informed consent, the endoscope was                            passed  under direct vision. Throughout the                            procedure, the patient's blood pressure, pulse, and                            oxygen saturations were monitored continuously. The                            [id number]-H190 ([id number]) Olympus gastroscope was                            introduced through the mouth, and advanced to the                            second part of duodenum. The upper GI endoscopy was                            accomplished without difficulty. The patient                            tolerated the procedure well. Scope In: Scope Out: Findings:      The esophagus was normal.      The stomach was normal.      Patchy mildly erythematous mucosa without active bleeding and with no       stigmata of bleeding was found in the duodenal bulb. Impression:               - Normal esophagus.                           - Normal stomach.                           - Erythematous duodenopathy.                           - No specimens collected. Moderate Sedation:  Not Applicable - Patient had care per Anesthesia. Recommendation:           - Resume Eliquis in 3 days.                           - Follow HGB and transfuse as necessary. Procedure Code(s):        --- Professional ---                           [phone], Esophagogastroduodenoscopy, flexible,                            transoral; diagnostic, including collection of                            specimen(s) by brushing or washing, when performed                            (separate procedure) Diagnosis Code(s):        --- Professional ---                           K31.89, Other diseases of stomach and duodenum                           K92.1, Melena (includes Hematochezia) CPT copyright 2019 [employer]. All rights reserved. The codes documented in this report are preliminary and upon coder review may  be revised to meet current compliance requirements. Carol Ada, MD Carol Ada, MD 01/30/2021  3:24:46 PM This report has been signed electronically. Number of Addenda: 0

## 2021-01-30 NOTE — Op Note (Signed)
[facility] Patient Name: Donald Harrington Procedure Date: 01/30/2021 MRN: [id number] Attending MD: Carol Ada , MD Date of Birth: 07-10-1956 CSN: [id number] Age: 65 Admit Type: Outpatient Procedure:                Colonoscopy Indications:              Hematochezia Providers:                Carol Ada, MD, Carmie End, RN, Tyna Jaksch Technician Referring MD:              Medicines:                Propofol per Anesthesia Complications:            No immediate complications. Estimated Blood Loss:     Estimated blood loss: none. Procedure:                Pre-Anesthesia Assessment:                           - Prior to the procedure, a History and Physical                            was performed, and patient medications and                            allergies were reviewed. The patient's tolerance of                            previous anesthesia was also reviewed. The risks                            and benefits of the procedure and the sedation                            options and risks were discussed with the patient.                            All questions were answered, and informed consent                            was obtained. Prior Anticoagulants: The patient has                            taken Eliquis (apixaban), last dose was 2 days                            prior to procedure. ASA Grade Assessment: III - A                            patient with severe systemic disease. After                            reviewing the risks  and benefits, the patient was                            deemed in satisfactory condition to undergo the                            procedure.                           - Sedation was administered by an anesthesia                            professional. Deep sedation was attained.                           After obtaining informed consent, the colonoscope                            was passed under  direct vision. Throughout the                            procedure, the patient's blood pressure, pulse, and                            oxygen saturations were monitored continuously. The                            [id number]-H190DL ([id number]) Olympus pediatric colonscope                            was introduced through the anus and advanced to the                            the cecum, identified by appendiceal orifice and                            ileocecal valve. The colonoscopy was performed                            without difficulty. The patient tolerated the                            procedure well. The quality of the bowel                            preparation was good. The ileocecal valve,                            appendiceal orifice, and rectum were photographed. Scope In: 2:31:57 PM Scope Out: 2:58:11 PM Scope Withdrawal Time: 0 hours 18 minutes 21 seconds  Total Procedure Duration: 0 hours 26 minutes 14 seconds  Findings:      Five sessile polyps were found in the recto-sigmoid colon, descending       colon and transverse colon. The polyps were 3 to 8 mm in size. These  polyps were removed with a cold snare. Resection and retrieval were       complete. Impression:               - Five 3 to 8 mm polyps at the recto-sigmoid colon,                            in the descending colon and in the transverse                            colon, removed with a cold snare. Resected and                            retrieved. Moderate Sedation:      Not Applicable - Patient had care per Anesthesia. Recommendation:           - Return patient to hospital ward for ongoing care.                           - Resume regular diet.                           - Continue present medications.                           - Await pathology results.                           - Repeat colonoscopy in 3 years for surveillance.                           - Resume Eliquis in 3 days. Procedure Code(s):         --- Professional ---                           [phone], Colonoscopy, flexible; with removal of                            tumor(s), polyp(s), or other lesion(s) by snare                            technique Diagnosis Code(s):        --- Professional ---                           K63.5, Polyp of colon                           K92.1, Melena (includes Hematochezia) CPT copyright 2019 [employer]. All rights reserved. The codes documented in this report are preliminary and upon coder review may  be revised to meet current compliance requirements. Carol Ada, MD Carol Ada, MD 01/30/2021 3:18:40 PM This report has been signed electronically. Number of Addenda: 0

## 2021-01-30 NOTE — Anesthesia Preprocedure Evaluation (Signed)
Anesthesia Evaluation  Patient identified by MRN, date of birth, ID band Patient awake    Reviewed: Allergy & Precautions, NPO status , Patient's Chart, lab work & pertinent test results, reviewed documented beta blocker date and time   Airway Mallampati: II  TM Distance: >3 FB Neck ROM: Full    Dental  (+) Edentulous Upper, Partial Lower, Dental Advisory Given, Missing, Poor Dentition   Pulmonary neg pulmonary ROS, Current Smoker and Patient abstained from smoking.,    Pulmonary exam normal breath sounds clear to auscultation       Cardiovascular hypertension, Pt. on home beta blockers + CAD, + Past MI and +CHF  + dysrhythmias Atrial Fibrillation + Valvular Problems/Murmurs  Rhythm:Regular Rate:Tachycardia + Systolic murmurs    Neuro/Psych PSYCHIATRIC DISORDERS CVA    GI/Hepatic Neg liver ROS, PUD,   Endo/Other  Hypothyroidism   Renal/GU negative Renal ROS     Musculoskeletal negative musculoskeletal ROS (+)   Abdominal Normal abdominal exam  (+)   Peds  Hematology  (+) Blood dyscrasia, anemia ,   Anesthesia Other Findings   1. Left ventricular ejection fraction, by estimation, is 55 to 60%. The  left ventricle has normal function.  2. Right ventricular systolic function is normal. The right ventricular  size is normal.  3. No left atrial/left atrial appendage thrombus was detected.  4. The mitral valve has been repaired/replaced. No evidence of mitral  valve regurgitation. No evidence of mitral stenosis.  5. The tricuspid valve is abnormal. Tricuspid valve regurgitation is  moderate.  6. The aortic valve has been repaired/replaced. Aortic valve  regurgitation is not visualized. No aortic stenosis is present.   FINDINGS  Left Ventricle  Reproductive/Obstetrics                             Anesthesia Physical  Anesthesia Plan  ASA: III  Anesthesia Plan: MAC   Post-op  Pain Management:    Induction: Intravenous  PONV Risk Score and Plan: 0 and Propofol infusion and Treatment may vary due to age or medical condition  Airway Management Planned: Natural Airway, Simple Face Mask and Nasal Cannula  Additional Equipment: None  Intra-op Plan:   Post-operative Plan:   Informed Consent: I have reviewed the patients History and Physical, chart, labs and discussed the procedure including the risks, benefits and alternatives for the proposed anesthesia with the patient or authorized representative who has indicated his/her understanding and acceptance.       Plan Discussed with: CRNA  Anesthesia Plan Comments:         Anesthesia Quick Evaluation

## 2021-01-30 NOTE — Interval H&P Note (Signed)
History and Physical Interval Note:  01/30/2021 2:13 PM  Donald Harrington  has presented today for surgery, with the diagnosis of GI bleed.  The various methods of treatment have been discussed with the patient and family. After consideration of risks, benefits and other options for treatment, the patient has consented to  Procedure(s): COLONOSCOPY WITH PROPOFOL (N/A) as a surgical intervention.  The patient's history has been reviewed, patient examined, no change in status, stable for surgery.  I have reviewed the patient's chart and labs.  Questions were answered to the patient's satisfaction.     [name]

## 2021-01-30 NOTE — Transfer of Care (Signed)
Immediate Anesthesia Transfer of Care Note  Patient: Thadius Smisek  Procedure(s) Performed: COLONOSCOPY WITH PROPOFOL (N/A ) POLYPECTOMY  Patient Location: Endoscopy Unit  Anesthesia Type:MAC  Level of Consciousness: sedated and patient cooperative  Airway & Oxygen Therapy: Patient Spontanous Breathing and Patient connected to face mask oxygen  Post-op Assessment: Report given to RN, Post -op Vital signs reviewed and stable and Patient moving all extremities  Post vital signs: Reviewed and stable  Last Vitals:  Vitals Value Taken Time  BP    Temp    Pulse 70 01/30/21 1511  Resp 15 01/30/21 1511  SpO2 100 % 01/30/21 1511  Vitals shown include unvalidated device data.  Last Pain:  Vitals:   01/30/21 1317  TempSrc: Oral  PainSc: 0-No pain         Complications: No complications documented.

## 2021-01-30 NOTE — Anesthesia Postprocedure Evaluation (Signed)
Anesthesia Post Note  Patient: Donald Harrington  Procedure(s) Performed: COLONOSCOPY WITH PROPOFOL (N/A ) POLYPECTOMY     Patient location during evaluation: Endoscopy Anesthesia Type: MAC Level of consciousness: awake Pain management: pain level controlled Vital Signs Assessment: post-procedure vital signs reviewed and stable Respiratory status: spontaneous breathing Cardiovascular status: stable Postop Assessment: no apparent nausea or vomiting Anesthetic complications: no   No complications documented.  Last Vitals:  Vitals:   01/30/21 1525 01/30/21 1530  BP: (!) 106/50 (!) 114/51  Pulse: (!) 53   Resp: 15 17  Temp:    SpO2: 100% 98%    Last Pain:  Vitals:   01/30/21 1530  TempSrc:   PainSc: 0-No pain                 Huston Foley

## 2021-01-30 NOTE — Procedures (Addendum)
[facility], Dr. Hilarie Fredrickson, is covering starting Friday and the weekend.

## 2021-01-30 NOTE — Progress Notes (Signed)
PROGRESS NOTE  Tyton Abdallah  ZDG:[id number] DOB: January 12, 1956 DOA: 01/28/2021 PCP: Patient, No Pcp Per (Inactive)   Brief Narrative: Summer Parthasarathy is a 65 y.o. 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 In the ED he was afebrile, hypotensive with Hgb 6.1, SCr 1.28, BUN 23, +FOBT, INR 1.3. 1u PRBCs and IVF ordered and pt admitted to SDU with GI recommending full day prep 4/13 with colonoscopy 4/14.   Endoscopy 01/05/2021: Erosive gastropathy with no bleeding and no stigmata of recent bleeding, nonbleeding duodenal ulcer without stigmata of bleeding likely due to NSAIDs and steroids.  Started on PPI twice daily and okay to restart anticoagulation (Coumadin at the time).  A colonoscopy was recommended however not done due to poor prep  Assessment & Plan: Principal Problem:   Acute blood loss anemia Active Problems:   GI bleed   PAF (paroxysmal atrial fibrillation) (HCC)   History of aortic valve replacement   History of mitral valve replacement   Bacteremia due to Streptococcus   Hypotension  Acute blood loss anemia due to recurrent GI bleeding:  - s/p 1u PRBC 4/12, hgb stable at 7.4, transfusion threshold is 7g/dl or 8g/dl if hypotension recurs.  - Continue empiric PPI IV - Hold anticoagulation - NPO for colonoscopy 4/14.   PAF, History of bioprosthetic AVR, MVR:  - Plan to restart beta blocker once BP normalized - Hold eliquis. May consider LAA clipping since failed coumadin (emboli) and now bleeding on eliquis.  Hypotension: Due to anemia on chronically soft baseline. This has stabilized.  - Ok to transfer to telemetry  Multifocal embolic CVA: Precipitated by  subtherapeutic INR. - Restart anticoagulation as soon as possible.  - Monitor neuro exam closely, stable.  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 Diagnosed at his last hospitalization and discharged on 01/13/21 with Gentamicin and Penicillin IV x 2 weeks followed by 4 additional weeks of Penicillin.  - Continue penicillin   DVT prophylaxis: SCDs Code Status: Full Family Communication: None at bedside Disposition Plan:  Status is: Inpatient  Remains inpatient appropriate because:Hemodynamically unstable and Ongoing diagnostic testing needed not appropriate for outpatient work up   Dispo: The patient is from: SNF              Anticipated d/c is to: SNF likely 4/15              Patient currently is not medically stable to d/c.   Difficult to place patient No  Consultants:   GI  Procedures:   Colonoscopy planned 4/14  Antimicrobials:  Penicillin   Subjective: No abd pain, having clear stools after prep without bleeding. No dyspnea, chest pain, palpitations or lightheadedness.   Objective: Vitals:   01/29/21 2325 01/29/21 2350 01/30/21 0400 01/30/21 0800  BP: 135/70  97/63 (!) 108/48  Pulse: 81  71 66  Resp: (!) 22  14 15   Temp:  98 F (36.7 C) (!) 97.5 F (36.4 C) (!) 97.5 F (36.4 C)  TempSrc:  Oral Oral Oral  SpO2: 100%  98% 100%  Weight:      Height:        Intake/Output  Summary (Last 24 hours) at 01/30/2021 1035 Last data filed at 01/30/2021 1000 Gross per 24 hour  Intake 2227.43 ml  Output 1975 ml  Net 252.43 ml   Filed Weights   01/28/21 1600  Weight: 73.3 kg   Gen: 65 y.o. male in no distress Pulm: Nonlabored breathing room air. Clear. CV: Regular with rate in 60-70's. No murmur, rub, or gallop. No JVD, no dependent edema. GI: Abdomen soft, non-tender, non-distended, with normoactive bowel sounds.  Ext: Warm, no deformities Skin: No new rashes, lesions  or ulcers on visualized skin. Neuro: Alert and oriented. No focal neurological deficits. Psych: Judgement and insight appear fair. Mood euthymic & affect congruent. Behavior is appropriate.    Data Reviewed: I have personally reviewed following labs and imaging studies  CBC: Recent Labs  Lab 01/28/21 1035 01/28/21 1808 01/28/21 2203 01/29/21 0254 01/29/21 1428 01/30/21 0251  WBC 4.4  --   --  5.3  --  6.1  HGB 6.1* 7.4* 7.6* 7.3* 8.1* 7.4*  HCT 19.5* 23.0* 23.6* 21.8* 24.7* 22.2*  MCV 107.1*  --   --  102.8*  --  104.2*  PLT 198  --   --  242  --  409   Basic Metabolic Panel: Recent Labs  Lab 01/28/21 1035 01/29/21 0254 01/30/21 0251  NA 141 135 137  K 3.8 3.6 4.4  CL 107 100 104  CO2 26 27 25   GLUCOSE 109* 96 102*  BUN 23 18 13   CREATININE 1.28* 1.37* 1.13  CALCIUM 8.4* 8.2* 8.4*   GFR: Estimated Creatinine Clearance: 66.4 mL/min (by C-G formula based on SCr of 1.13 mg/dL). Liver Function Tests: Recent Labs  Lab 01/28/21 1035  AST 11*  ALT 10  ALKPHOS 43  BILITOT 0.5  PROT 5.8*  ALBUMIN 2.6*   No results for input(s): LIPASE, AMYLASE in the last 168 hours. No results for input(s): AMMONIA in the last 168 hours. Coagulation Profile: Recent Labs  Lab 01/28/21 1035  INR 1.3*   Cardiac Enzymes: No results for input(s): CKTOTAL, CKMB, CKMBINDEX, TROPONINI in the last 168 hours. BNP (last 3 results) No results for input(s): PROBNP in the last 8760 hours. HbA1C: No results for input(s): HGBA1C in the last 72 hours. CBG: No results for input(s): GLUCAP in the last 168 hours. Lipid Profile: No results for input(s): CHOL, HDL, LDLCALC, TRIG, CHOLHDL, LDLDIRECT in the last 72 hours. Thyroid Function Tests: No results for input(s): TSH, T4TOTAL, FREET4, T3FREE, THYROIDAB in the last 72 hours. Anemia Panel: No results for input(s): VITAMINB12, FOLATE, FERRITIN, TIBC, IRON, RETICCTPCT in the last 72 hours. Urine analysis:    Component Value Date/Time    COLORURINE YELLOW 01/04/2021 1729   APPEARANCEUR CLEAR 01/04/2021 1729   LABSPEC 1.012 01/04/2021 1729   PHURINE 5.0 01/04/2021 1729   GLUCOSEU NEGATIVE 01/04/2021 1729   HGBUR MODERATE (A) 01/04/2021 1729   BILIRUBINUR NEGATIVE 01/04/2021 1729   KETONESUR NEGATIVE 01/04/2021 1729   PROTEINUR NEGATIVE 01/04/2021 1729   NITRITE NEGATIVE 01/04/2021 1729   LEUKOCYTESUR NEGATIVE 01/04/2021 1729   Recent Results (from the past 240 hour(s))  Resp Panel by RT-PCR (Flu A&B, Covid) Nasopharyngeal Swab     Status: None   Collection Time: 01/20/21  3:16 PM   Specimen: Nasopharyngeal Swab; Nasopharyngeal(NP) swabs in vial transport medium  Result Value Ref Range Status   SARS Coronavirus 2 by RT PCR NEGATIVE NEGATIVE Final    Comment: (NOTE) SARS-CoV-2 target nucleic acids are NOT DETECTED.  The SARS-CoV-2 RNA is  generally detectable in upper respiratory specimens during the acute phase of infection. The lowest concentration of SARS-CoV-2 viral copies this assay can detect is 138 copies/mL. A negative result does not preclude SARS-Cov-2 infection and should not be used as the sole basis for treatment or other patient management decisions. A negative result may occur with  improper specimen collection/handling, submission of specimen other than nasopharyngeal swab, presence of viral mutation(s) within the areas targeted by this assay, and inadequate number of viral copies(<138 copies/mL). A negative result must be combined with clinical observations, patient history, and epidemiological information. The expected result is Negative.  Fact Sheet for Patients:  EntrepreneurPulse.com.au  Fact Sheet for Healthcare Providers:  IncredibleEmployment.be  This test is no t yet approved or cleared by the [place] FDA and  has been authorized for detection and/or diagnosis of SARS-CoV-2 by FDA under an Emergency Use Authorization (EUA). This EUA will remain   in effect (meaning this test can be used) for the duration of the COVID-19 declaration under Section 564(b)(1) of the Act, 21 U.S.C.section 360bbb-3(b)(1), unless the authorization is terminated  or revoked sooner.       Influenza A by PCR NEGATIVE NEGATIVE Final   Influenza B by PCR NEGATIVE NEGATIVE Final    Comment: (NOTE) The Xpert Xpress SARS-CoV-2/FLU/RSV plus assay is intended as an aid in the diagnosis of influenza from Nasopharyngeal swab specimens and should not be used as a sole basis for treatment. Nasal washings and aspirates are unacceptable for Xpert Xpress SARS-CoV-2/FLU/RSV testing.  Fact Sheet for Patients: EntrepreneurPulse.com.au  Fact Sheet for Healthcare Providers: IncredibleEmployment.be  This test is not yet approved or cleared by the [place] FDA and has been authorized for detection and/or diagnosis of SARS-CoV-2 by FDA under an Emergency Use Authorization (EUA). This EUA will remain in effect (meaning this test can be used) for the duration of the COVID-19 declaration under Section 564(b)(1) of the Act, 21 U.S.C. section 360bbb-3(b)(1), unless the authorization is terminated or revoked.  Performed at [facility], [place] [address] [place], [place] [address]   Respiratory (~20 pathogens) panel by PCR     Status: None   Collection Time: 01/20/21  9:20 PM   Specimen: Nasopharyngeal Swab; Respiratory  Result Value Ref Range Status   Adenovirus NOT DETECTED NOT DETECTED Final   Coronavirus 229E NOT DETECTED NOT DETECTED Final    Comment: (NOTE) The Coronavirus on the Respiratory Panel, DOES NOT test for the novel  Coronavirus (2019 nCoV)    Coronavirus HKU1 NOT DETECTED NOT DETECTED Final   Coronavirus NL63 NOT DETECTED NOT DETECTED Final   Coronavirus OC43 NOT DETECTED NOT DETECTED Final   Metapneumovirus NOT DETECTED NOT DETECTED Final   Rhinovirus / Enterovirus NOT DETECTED NOT DETECTED Final    Influenza A NOT DETECTED NOT DETECTED Final   Influenza B NOT DETECTED NOT DETECTED Final   Parainfluenza Virus 1 NOT DETECTED NOT DETECTED Final   Parainfluenza Virus 2 NOT DETECTED NOT DETECTED Final   Parainfluenza Virus 3 NOT DETECTED NOT DETECTED Final   Parainfluenza Virus 4 NOT DETECTED NOT DETECTED Final   Respiratory Syncytial Virus NOT DETECTED NOT DETECTED Final   Bordetella pertussis NOT DETECTED NOT DETECTED Final   Bordetella Parapertussis NOT DETECTED NOT DETECTED Final   Chlamydophila pneumoniae NOT DETECTED NOT DETECTED Final   Mycoplasma pneumoniae NOT DETECTED NOT DETECTED Final    Comment: Performed at [facility], [place]. [address] [place], [place] [address]  Resp Panel by  RT-PCR (Flu A&B, Covid) Nasopharyngeal Swab     Status: None   Collection Time: 01/28/21 10:27 AM   Specimen: Nasopharyngeal Swab; Nasopharyngeal(NP) swabs in vial transport medium  Result Value Ref Range Status   SARS Coronavirus 2 by RT PCR NEGATIVE NEGATIVE Final    Comment: (NOTE) SARS-CoV-2 target nucleic acids are NOT DETECTED.  The SARS-CoV-2 RNA is generally detectable in upper respiratory specimens during the acute phase of infection. The lowest concentration of SARS-CoV-2 viral copies this assay can detect is 138 copies/mL. A negative result does not preclude SARS-Cov-2 infection and should not be used as the sole basis for treatment or other patient management decisions. A negative result may occur with  improper specimen collection/handling, submission of specimen other than nasopharyngeal swab, presence of viral mutation(s) within the areas targeted by this assay, and inadequate number of viral copies(<138 copies/mL). A negative result must be combined with clinical observations, patient history, and epidemiological information. The expected result is Negative.  Fact Sheet for Patients:  EntrepreneurPulse.com.au  Fact Sheet for Healthcare Providers:   IncredibleEmployment.be  This test is no t yet approved or cleared by the [place] FDA and  has been authorized for detection and/or diagnosis of SARS-CoV-2 by FDA under an Emergency Use Authorization (EUA). This EUA will remain  in effect (meaning this test can be used) for the duration of the COVID-19 declaration under Section 564(b)(1) of the Act, 21 U.S.C.section 360bbb-3(b)(1), unless the authorization is terminated  or revoked sooner.       Influenza A by PCR NEGATIVE NEGATIVE Final   Influenza B by PCR NEGATIVE NEGATIVE Final    Comment: (NOTE) The Xpert Xpress SARS-CoV-2/FLU/RSV plus assay is intended as an aid in the diagnosis of influenza from Nasopharyngeal swab specimens and should not be used as a sole basis for treatment. Nasal washings and aspirates are unacceptable for Xpert Xpress SARS-CoV-2/FLU/RSV testing.  Fact Sheet for Patients: EntrepreneurPulse.com.au  Fact Sheet for Healthcare Providers: IncredibleEmployment.be  This test is not yet approved or cleared by the [place] FDA and has been authorized for detection and/or diagnosis of SARS-CoV-2 by FDA under an Emergency Use Authorization (EUA). This EUA will remain in effect (meaning this test can be used) for the duration of the COVID-19 declaration under Section 564(b)(1) of the Act, 21 U.S.C. section 360bbb-3(b)(1), unless the authorization is terminated or revoked.  Performed at [facility], [place] [address] [place], [place] [address]   MRSA PCR Screening     Status: None   Collection Time: 01/28/21  3:32 PM   Specimen: Nasopharyngeal  Result Value Ref Range Status   MRSA by PCR NEGATIVE NEGATIVE Final    Comment:        The GeneXpert MRSA Assay (FDA approved for NASAL specimens only), is one component of a comprehensive MRSA colonization surveillance program. It is not intended to diagnose MRSA infection nor  to guide or monitor treatment for MRSA infections. Performed at [facility], [place] [address], [place], [place] [address]       Radiology Studies: No results found.  Scheduled Meds: . Chlorhexidine Gluconate Cloth  6 each Topical Daily  . levothyroxine  25 mcg Oral Daily  . mouth rinse  15 mL Mouth Rinse BID  . pantoprazole (PROTONIX) IV  40 mg Intravenous Q12H  . sodium chloride flush  3 mL Intravenous Q12H   Continuous Infusions: . sodium chloride 10 mL/hr at 01/29/21 1856  . penicillin g continuous IV infusion 12  Million Units (01/30/21 0615)     LOS: 2 days   Time spent: 35 minutes.  Patrecia Pour, MD [facility] [email or web address] 01/30/2021, 10:35 AM

## 2021-01-30 NOTE — Progress Notes (Signed)
Finished bowel prep around 2300, had large BM's, no blood in BM noted. Hbg this AM 7.4, pt asymptomatic, BP's stable and pt states "feels fine"

## 2021-01-31 ENCOUNTER — Encounter (HOSPITAL_COMMUNITY): Payer: Self-pay | Admitting: Gastroenterology

## 2021-01-31 LAB — BASIC METABOLIC PANEL
Anion gap: 8 (ref 5–15)
BUN: 15 mg/dL (ref 8–23)
CO2: 23 mmol/L (ref 22–32)
Calcium: 8.5 mg/dL — ABNORMAL LOW (ref 8.9–10.3)
Chloride: 107 mmol/L (ref 98–111)
Creatinine, Ser: 1.48 mg/dL — ABNORMAL HIGH (ref 0.61–1.24)
GFR, Estimated: 52 mL/min — ABNORMAL LOW (ref >60)
Glucose, Bld: 106 mg/dL — ABNORMAL HIGH (ref 70–99)
Potassium: 4 mmol/L (ref 3.5–5.1)
Sodium: 138 mmol/L (ref 135–145)

## 2021-01-31 LAB — CBC
HCT: 23.2 % — ABNORMAL LOW (ref 39.0–52.0)
Hemoglobin: 7.4 g/dL — ABNORMAL LOW (ref 13.0–17.0)
MCH: 33.8 pg (ref 26.0–34.0)
MCHC: 31.9 g/dL (ref 30.0–36.0)
MCV: 105.9 fL — ABNORMAL HIGH (ref 80.0–100.0)
Platelets: 237 10*3/uL (ref 150–400)
RBC: 2.19 MIL/uL — ABNORMAL LOW (ref 4.22–5.81)
RDW: 15.9 % — ABNORMAL HIGH (ref 11.5–15.5)
WBC: 5.5 10*3/uL (ref 4.0–10.5)
nRBC: 0 % (ref 0.0–0.2)

## 2021-01-31 LAB — SURGICAL PATHOLOGY

## 2021-01-31 MED ORDER — APIXABAN 5 MG PO TABS: 5.0000 mg | ORAL_TABLET | Freq: Two times a day (BID) | ORAL | 3 refills | Status: AC

## 2021-01-31 NOTE — Care Management Important Message (Signed)
Medicare IM printed to give to the patient, by Caree Wolpert 

## 2021-01-31 NOTE — Discharge Summary (Signed)
Physician Discharge Summary  [facility] [place] [facility]:[id number] DOB: 1956/06/29 DOA: 01/28/2021  PCP: Patient, No Pcp Per (Inactive)  Admit date: 01/28/2021 Discharge date: 01/31/2021  Admitted From: [facility] Disposition: [facility]   Recommendations for Outpatient Follow-up:  1. Restart eliquis on 4/17 per GI recommendations.  2. Continue penicillin G through 5/2 3. Recheck CBC within the next week. Also check BMP and CBC weekly as well as every other week ESR and CRP per recommendations in regards to PCN treatment of PV endocarditis. 4. Consider cardiology evaluation for LAA clipping (as he has failed coumadin in the past w/embolic strokes and was bleeding on eliquis).  Home Health: N/A Equipment/Devices: None new, continue RUE PICC Discharge Condition: Stable CODE STATUS: Full Diet recommendation: Heart healthy  Brief/Interim Summary: Donald Harrington is a 65 y.o. 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 In the ED he was afebrile, hypotensive with Hgb 6.1, SCr 1.28, BUN 23, +FOBT, INR 1.3. 1u PRBCs and IVF ordered and pt admitted to SDU with GI recommending full day prep 4/13 with colonoscopy 4/14. Colonoscopy and EGD were performed on 4/14 revealing erythematous duodenopathy and colonic polyps without clear source of bleeding. Eliquis has been held and should be held for 3 days from the time of colonoscopy per GI recommendations. Hemoglobin has stabilized without further transfusions and bleeding has remained resolved throughout admission. He is stable to return to SNF.  Endoscopy 01/05/2021 (Previous admission): Erosive gastropathy with no bleeding and no  stigmata of recent bleeding, nonbleeding duodenal ulcer without stigmata of bleeding likely due to NSAIDs and steroids. Started on PPI twice daily and okay to restart anticoagulation (Coumadin at the time). A colonoscopy was recommended however not done due to poor prep  Colonoscopy 01/30/2021 Dr. Benson Norway Impression:       - Five 3 to 8 mm polyps at the recto-sigmoid colon,                            in the descending colon and in the transverse                            colon, removed with a cold snare. Resected and                            retrieved.  Recommendation: - Return patient to hospital ward for ongoing care.                           - Resume regular diet.                           - Continue present medications.                           - Await pathology results.                           -  Repeat colonoscopy in 3 years for surveillance.                           - Resume Eliquis in 3 days.  EGD 01/30/2021 Dr. Benson Norway Impression:       - Normal esophagus.                           - Normal stomach.                           - Erythematous duodenopathy.                           - No specimens collected.  Recommendation: - Resume Eliquis in 3 days.                           - Follow HGB and transfuse as necessary.  Discharge Diagnoses:  Principal Problem:   Acute blood loss anemia Active Problems:   GI bleed   PAF (paroxysmal atrial fibrillation) (HCC)   History of aortic valve replacement   History of mitral valve replacement   Bacteremia due to Streptococcus   Hypotension  Acute blood loss anemia due to recurrent GI bleeding: Possibly related to duodenopathy or polyps which were resected at colonoscopy.  - s/p 1u PRBC 4/12, hgb stable at 7.4, transfusion threshold is 7g/dl or 8g/dl if hypotension recurs.  - Hold anticoagulation - Continue PPI - Follow up pathology results per GI from colonoscopy - Repeat colonoscopy recommended in 3 years per GI.  PAF, History  of bioprosthetic AVR, MVR:  - Can restart home medications with stabilized vital signs. - Hold eliquis. May consider LAA clipping since failed coumadin (emboli) and now bleeding on eliquis.  Hypotension: Due to anemia on chronically soft baseline. This has stabilized and improved.  Multifocal embolic CVA: Precipitated by subtherapeutic INR. - Restart anticoagulation as soon as possible, 4/17 per GI.  - Monitor neuro exam closely, stable.  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 Diagnosed at his last hospitalization and discharged on 01/13/21 with Gentamicin and Penicillin IV x 2 weeks followed by 4 additional weeks of Penicillin.  - Continue penicillin, dose changed at discretion of SNF MD to 26M units q12h which is equivalent daily dosing. End date remains 02/17/2021. Has completed gentamicin.   Discharge Instructions  Allergies as of 01/31/2021   No Known Allergies     Medication List    STOP taking these medications   gentamicin 200 mg in dextrose 5 % 50 mL   penicillin G  IVPB     TAKE these medications   apixaban 5 MG Tabs tablet Commonly known as: ELIQUIS Take 1 tablet (5 mg total) by mouth 2 (two) times daily. hold until 4/17 What changed: additional instructions   cyclobenzaprine 10 MG tablet Commonly known as: FLEXERIL Take 10 mg by mouth daily as needed for muscle spasms.   Euthyrox 25 MCG tablet Generic drug: levothyroxine Take 25 mcg by mouth daily.   furosemide 20 MG tablet Commonly known as: LASIX Take 40 mg by mouth daily.   lovastatin 20 MG tablet Commonly known as: MEVACOR Take 20 mg by mouth at bedtime.   melatonin 5 MG Tabs Take 1 tablet (5 mg total) by  mouth at bedtime.   metoprolol succinate 50 MG 24 hr tablet Commonly known as: TOPROL-XL Take 50 mg by mouth daily.   pantoprazole 40 MG tablet Commonly known as: PROTONIX Take 1 tablet (40 mg  total) by mouth 2 (two) times daily for 28 days.   penicillin G potassium 12 Million Units in dextrose 5 % 500 mL Inject 12 Million Units into the vein every 12 (twelve) hours.   thiamine 250 MG tablet Take 250 mg by mouth daily.       Follow-up [facility].   Specialty: [place] information: [place] [address] [phone]              No Known Allergies  Consultations:  GI  Procedures/Studies: DG Orthopantogram  Result Date: 01/07/2021 CLINICAL DATA:  Endocarditis EXAM: ORTHOPANTOGRAM/PANORAMIC COMPARISON:  None. FINDINGS: The mandible is intact. The maxilla is intact. There is numerous absent dentition involving the maxillary and mandibular molars and the majority of a maxillary and mandibular premolars.z dental caries are identified within tooth 8, the residual fragment of tooth 13, tooth 24, and tooth 25. A lucent lesion is seen within the maxilla within the defect related to the absent tooth 10 demonstrating relatively poorly circumscribed margins and no significant matrix which may represent a residual periapical abscess. IMPRESSION: Multiple dental caries within the residual dentition as described above. Lucency within the maxilla within the defect related to the absent tooth 10 which may represent a a residual periapical abscess. Electronically Signed   By: [name]   On: 01/07/2021 11:20   MR ANGIO HEAD WO CONTRAST  Result Date: 01/04/2021 CLINICAL DATA:  Multiple embolic strokes. EXAM: MRA HEAD WITHOUT CONTRAST TECHNIQUE: Angiographic images of the Circle of Willis were obtained using MRA technique without intravenous contrast. COMPARISON:  MRI yesterday. FINDINGS: Both internal carotid arteries are patent through the skull base and siphon regions. The right ICA supplies the right middle cerebral artery territory and both anterior cerebral artery territories. No evidence of large vessel  occlusion. No correctable proximal stenosis. Left internal carotid artery supplies the left middle cerebral artery territory and the left PCA. No large vessel occlusion. Both vertebral arteries are patent to the basilar. No basilar stenosis. Posterior circulation branch vessels show flow. Left PCA takes fetal origin from the anterior circulation, as noted above. IMPRESSION: No large or medium vessel occlusion or correctable proximal stenosis. Electronically Signed   By: Nelson Chimes M.D.   On: 01/04/2021 16:47   MR CERVICAL SPINE WO CONTRAST  Result Date: 01/10/2021 CLINICAL DATA:  Initial evaluation for acute neck pain, infection suspected. EXAM: MRI CERVICAL SPINE WITHOUT CONTRAST TECHNIQUE: Multiplanar, multisequence MR imaging of the cervical spine was performed. No intravenous contrast was administered. COMPARISON:  Prior MRI from 12/05/2020. FINDINGS: Alignment: Examination somewhat degraded by motion artifact. Straightening with slight reversal of the normal cervical lordosis. Trace anterolisthesis of C3 on C4, stable. Vertebrae: Vertebral body height maintained without acute or interval fracture. Bone marrow signal intensity diffusely heterogeneous without worrisome osseous lesion. Discogenic reactive endplate changes present about the C5-6 through C7-T1 interspaces, with partial ankylosis across the C6-7 disc space. Appearance is stable from previous. No signal changes to suggest osteomyelitis discitis or septic arthritis. Cord: Normal signal and morphology.  No epidural collections. Posterior Fossa, vertebral arteries, paraspinal tissues: Visualized brain and posterior fossa within normal limits. Craniocervical junction normal. Paraspinous and prevertebral soft tissues within normal limits. Normal flow voids  seen within the vertebral arteries bilaterally. Disc levels: C2-C3: Small central disc protrusion mildly indents the ventral thecal sac, slightly asymmetric to the left. No significant spinal  stenosis or cord deformity. Superimposed mild left greater than right facet degeneration without significant foraminal stenosis. Appearance is stable. C3-C4: Trace anterolisthesis. Central disc protrusion indents the ventral thecal sac, contacting and mildly flattening the ventral spinal cord. No cord signal changes or significant spinal stenosis. Superimposed left greater than right uncovertebral and facet hypertrophy with resultant mild left C4 foraminal stenosis. Right neural foramina remains patent. Appearance is stable. C4-C5: Degenerative intervertebral disc space narrowing with diffuse disc osteophyte complex, eccentric to the left. Broad posterior component flattens and partially effaces the ventral thecal sac with resultant mild spinal stenosis. Mild cord flattening without cord signal changes. Superimposed bilateral facet hypertrophy with resultant moderate to severe bilateral C5 foraminal narrowing. Appearance is stable. C5-C6: Degenerative intervertebral disc space narrowing with diffuse disc osteophyte complex, asymmetric to the left. Broad posterior component flattens and partially faces the ventral thecal sac. Mild flattening of the ventral cord, greater on the left. No cord signal changes. Mild spinal stenosis is stable. Moderate bilateral C6 foraminal narrowing, worse on the right, also stable. C6-C7: Advanced degenerative intervertebral disc space narrowing with partial ankylosis across the C6-7 interspace. Right greater than left uncovertebral spurring. Posterior disc osteophyte flattens and partially effaces the ventral thecal sac without significant stenosis or cord impingement. Superimposed mild facet hypertrophy. Mild bilateral C7 foraminal stenosis is relatively stable. C7-T1: Degenerative intervertebral disc space narrowing with diffuse disc osteophyte complex. Flattening and partial effacement of the ventral thecal sac without significant spinal stenosis or cord deformity. Superimposed  bilateral facet hypertrophy. Moderate left with mild right C8 foraminal narrowing, stable. Visualized upper thoracic spine demonstrates no significant finding. IMPRESSION: 1. No MRI evidence for acute infection or other abnormality within the cervical spine. 2. Multilevel cervical spondylosis with resultant mild spinal stenosis at C4-5 and C5-6. 3. Multifactorial degenerative changes with resultant multilevel foraminal narrowing as above. Notable findings include moderate to severe bilateral C5 foraminal narrowing, with moderate bilateral C6 and left C8 foraminal stenosis. Electronically Signed   By: Jeannine Boga M.D.   On: 01/10/2021 23:15   DG Chest Port 1 View  Result Date: 01/28/2021 CLINICAL DATA:  Weakness EXAM: PORTABLE CHEST 1 VIEW COMPARISON:  01/05/2021 FINDINGS: New right-sided PICC line is noted with the catheter tip in the mid superior vena cava. Cardiac shadow is stable. Changes of prior heart surgery are noted. Lungs are clear bilaterally. No bony abnormality is seen. IMPRESSION: No active disease. Electronically Signed   By: Inez Catalina M.D.   On: 01/28/2021 10:48   DG CHEST PORT 1 VIEW  Result Date: 01/05/2021 CLINICAL DATA:  Shortness of breath EXAM: PORTABLE CHEST 1 VIEW COMPARISON:  01/03/2021 FINDINGS: Prior median sternotomy and valve replacement. Heart is normal size. Hyperinflation/COPD. Bibasilar opacities. No effusions. No acute bony abnormality. IMPRESSION: COPD. Bibasilar atelectasis or infiltrates, new since prior study. Electronically Signed   By: Rolm Baptise M.D.   On: 01/05/2021 15:35   DG CHEST PORT 1 VIEW  Result Date: 01/03/2021 CLINICAL DATA:  Cough.  Acute blood loss. EXAM: PORTABLE CHEST 1 VIEW COMPARISON:  January 03, 2021 FINDINGS: Stable sternotomy wires and cardiomegaly. The hila and mediastinum are unremarkable. Skin fold over the upper lateral left chest. No pneumothorax. No nodules or masses. Mild interstitial prominence without overt edema. No focal  infiltrate. IMPRESSION: Probable mild pulmonary venous congestion. No other acute abnormalities.  Electronically Signed   By: Dorise Bullion III M.D   On: 01/03/2021 21:21   ECHOCARDIOGRAM COMPLETE  Result Date: 01/06/2021    ECHOCARDIOGRAM REPORT   Patient Name:   KETHAN Bubar Date of Exam: 01/05/2021 Medical Rec #:  [id number]    Height:       70.5 in Accession #:    [id number]   Weight:       165.0 lb Date of Birth:  07-21-1956    BSA:          1.934 m Patient Age:    10 years     BP:           109/68 mmHg Patient Gender: M            HR:           110 bpm. Exam Location:  Inpatient Procedure: 2D Echo Indications:    Stroke  History:        Patient has no prior history of Echocardiogram examinations.                 [facility]ortic and mitral replacement [facility] due                 to endocarditis; Aortic Valve Disease, Mitral Valve Disease and                 Endocarditis.  Sonographer:    Merrie Roof RDCS Referring Phys: [place]  1. Poor acoustic windows. Cannot exclude vegetations. Would recomm TEE to further evaluate if clinically indicated.  2. Left ventricular ejection fraction, by estimation, is 55 to 60%. The left ventricle has normal function. The left ventricle has no regional wall motion abnormalities. Indeterminate diastolic filling due to E-A fusion.  3. Right ventricular systolic function is normal. The right ventricular size is normal.  4. Left atrial size was mild to moderately dilated.  5. MV prosthesis (Epic bioprosthesis, 05/01/14) Not well visualized. Peak and mean gradients through the valveare 19 and 9 mm Hx respectively. Compared to echo report from 06/05/19 [facility]) mild increase in mean gradient (6 to 9 mm). The mitral valve has been repaired/replaced. Trivial mitral valve regurgitation.  6. AV prosthesis (23 mm Hancock bioprosthesis, 05/01/14) present Not well visualized. Peak and mean gradients through the valve are 51 and 30 mm Hg respectively  Comparee to reprot for 8/117/20 [facility]), mean graidnet is increased. . The aortic valve has been repaired/replaced. Aortic valve regurgitation is trivial.  7. The inferior vena cava is normal in size with greater than 50% respiratory variability, suggesting right atrial pressure of 3 mmHg. FINDINGS  Left Ventricle: Left ventricular ejection fraction, by estimation, is 55 to 60%. The left ventricle has normal function. The left ventricle has no regional wall motion abnormalities. The left ventricular internal cavity size was normal in size. There is  no left ventricular hypertrophy. Indeterminate diastolic filling due to E-A fusion. Right Ventricle: The right ventricular size is normal. Right vetricular wall thickness was not assessed. Right ventricular systolic function is normal. Left Atrium: Left atrial size was mild to moderately dilated. Right Atrium: Right atrial size was normal in size. Pericardium: There is no evidence of pericardial effusion. Mitral Valve: MV prosthesis (Epic bioprosthesis, 05/01/14) Not well visualized peak and mean gradients through the valveare 19 and 9 mm Hx respectively. Compared to echo reprot for 06/05/19 [facility]) mild increase in mean gradient (6 to 9 mm). The mitral valve has been  repaired/replaced. Trivial mitral valve regurgitation. MV peak gradient, 18.5 mmHg. The mean mitral valve gradient is 9.0 mmHg. Tricuspid Valve: The tricuspid valve is grossly normal. Tricuspid valve regurgitation is mild. Aortic Valve: AV prosthesis (23 mm Hancock bioprosthesis, 05/01/14) present Not well visualized. Peak and mean gradients through the valve are 51 and 30 mm Hg respectively Comparee to reprot for 8/117/20 [facility] mean graidnet is increased. The aortic valve has been repaired/replaced. Aortic valve regurgitation is trivial. Aortic valve mean gradient measures 30.0 mmHg. Aortic valve peak gradient measures 50.7 mmHg. Aortic valve area, by VTI measures 0.43 cm. Pulmonic Valve: The pulmonic valve  was not well visualized. Pulmonic valve regurgitation is not visualized. Aorta: The aortic root is normal in size and structure. Venous: The inferior vena cava is normal in size with greater than 50% respiratory variability, suggesting right atrial pressure of 3 mmHg. IAS/Shunts: The interatrial septum was not assessed.  LEFT VENTRICLE PLAX 2D LVIDd:         5.10 cm  Diastology LVIDs:         3.70 cm  LV e' lateral: 10.30 cm/s LV PW:         0.80 cm LV IVS:        1.00 cm LVOT diam:     2.00 cm LV SV:         33 LV SV Index:   17 LVOT Area:     3.14 cm  RIGHT VENTRICLE          IVC RV Basal diam:  3.80 cm  IVC diam: 1.90 cm LEFT ATRIUM             Index       RIGHT ATRIUM           Index LA diam:        5.20 cm 2.69 cm/m  RA Area:     17.40 cm LA Vol (A2C):   64.0 ml 33.10 ml/m RA Volume:   45.50 ml  23.53 ml/m LA Vol (A4C):   95.3 ml 49.29 ml/m LA Biplane Vol: 82.6 ml 42.72 ml/m  AORTIC VALVE AV Area (Vmax):    0.67 cm AV Area (Vmean):   0.50 cm AV Area (VTI):     0.43 cm AV Vmax:           356.00 cm/s AV Vmean:          262.000 cm/s AV VTI:            0.752 m AV Peak Grad:      50.7 mmHg AV Mean Grad:      30.0 mmHg LVOT Vmax:         75.50 cm/s LVOT Vmean:        42.000 cm/s LVOT VTI:          0.104 m LVOT/AV VTI ratio: 0.14  AORTA Ao Root diam: 3.10 cm MITRAL VALVE             TRICUSPID VALVE MV Area VTI:  0.60 cm   TR Peak grad:   30.9 mmHg MV Peak grad: 18.5 mmHg  TR Vmax:        278.00 cm/s MV Mean grad: 9.0 mmHg MV Vmax:      2.15 m/s   SHUNTS MV Vmean:     139.0 cm/s Systemic VTI:  0.10 m  Systemic Diam: 2.00 cm Dorris Carnes MD Electronically signed by Dorris Carnes MD Signature Date/Time: 01/06/2021/4:04:54 PM    Final    ECHO TEE  Result Date: 01/08/2021    TRANSESOPHOGEAL ECHO REPORT   Patient Name:   MAVERYCK Fasnacht Date of Exam: 01/08/2021 Medical Rec #:  [id number]    Height:       70.5 in Accession #:    [id number]   Weight:       175.9 lb Date of Birth:  [birth year]    BSA:           1.987 m Patient Age:    86 years     BP:           86/59 mmHg Patient Gender: M            HR:           91 bpm. Exam Location:  Inpatient Procedure: Transesophageal Echo, Cardiac Doppler and Color Doppler Indications:     Bacteremia  History:         Patient has prior history of Echocardiogram examinations, most                  recent 01/06/2021. Previous Myocardial Infarction, Stroke,                  Endocarditis and AVR. MVR, Arrythmias:Atrial Fibrillation,                  Signs/Symptoms:Bacteremia; Risk Factors:Hypertension.  Sonographer:     Dustin Flock Referring Phys:  [id number] Tami Lin DUKE Diagnosing Phys: Mertie Moores MD PROCEDURE: The transesophogeal probe was passed without difficulty through the esophogus of the patient. Sedation performed by performing physician. The patient was monitored while under deep sedation. Anesthestetic sedation was provided intravenously by  Anesthesiology: 233.68m of Propofol, 879mof Lidocaine. The patient developed no complications during the procedure. IMPRESSIONS  1. Left ventricular ejection fraction, by estimation, is 55 to 60%. The left ventricle has normal function.  2. Right ventricular systolic function is normal. The right ventricular size is normal.  3. No left atrial/left atrial appendage thrombus was detected.  4. The mitral valve has been repaired/replaced. No evidence of mitral valve regurgitation. No evidence of mitral stenosis.  5. The tricuspid valve is abnormal. Tricuspid valve regurgitation is moderate.  6. The aortic valve has been repaired/replaced. Aortic valve regurgitation is not visualized. No aortic stenosis is present. FINDINGS  Left Ventricle: Left ventricular ejection fraction, by estimation, is 55 to 60%. The left ventricle has normal function. The left ventricular internal cavity size was normal in size. Right Ventricle: The right ventricular size is normal. No increase in right ventricular wall thickness. Right  ventricular systolic function is normal. Left Atrium: Left atrial size was normal in size. No left atrial/left atrial appendage thrombus was detected. Right Atrium: Right atrial size was normal in size. Pericardium: There is no evidence of pericardial effusion. Mitral Valve: The mitral valve has been repaired/replaced. No evidence of mitral valve regurgitation. There is a bioprosthetic valve present in the mitral position. No evidence of mitral valve stenosis. There is no evidence of mitral valve vegetation. Tricuspid Valve: The tricuspid valve is abnormal. Tricuspid valve regurgitation is moderate. Aortic Valve: The aortic valve has been repaired/replaced. Aortic valve regurgitation is not visualized. No aortic stenosis is present. There is a bioprosthetic valve present in the aortic position. There is no evidence of aortic valve vegetation. Pulmonic Valve: The pulmonic valve was grossly normal. Pulmonic  valve regurgitation is trivial. Aorta: The aortic root and ascending aorta are structurally normal, with no evidence of dilitation. There is minimal (Grade I) plaque. IAS/Shunts: No atrial level shunt detected by color flow Doppler. Mertie Moores MD Electronically signed by Mertie Moores MD Signature Date/Time: 01/08/2021/3:13:00 PM    Final    VAS US CAROTID  Result Date: 01/07/2021 Carotid Arterial Duplex Study Indications:       CVA. Risk Factors:      Hypertension, coronary artery disease, prior CVA. 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                    MVR and AVR 2015. PAF, on Coumadin. Comparison Study:  No prior study Performing Technologist: Sharion Dove RVS  Examination Guidelines: A complete evaluation includes B-mode imaging, spectral Doppler, color Doppler, and power Doppler as needed of all accessible portions of each vessel. Bilateral testing is considered an integral part of a complete examination. Limited examinations for reoccurring indications may  be performed as noted.  Right Carotid Findings: +----------+--------+--------+--------+------------------+------------------+           PSV cm/sEDV cm/sStenosisPlaque DescriptionComments           +----------+--------+--------+--------+------------------+------------------+ CCA Prox  79      32                                intimal thickening +----------+--------+--------+--------+------------------+------------------+ CCA Distal82      30                                intimal thickening +----------+--------+--------+--------+------------------+------------------+ ICA Prox  128     49      40-59%  calcific          Shadowing          +----------+--------+--------+--------+------------------+------------------+ ICA Distal101     38                                                   +----------+--------+--------+--------+------------------+------------------+ ECA       134     20                                                   +----------+--------+--------+--------+------------------+------------------+ +----------+--------+-------+--------+-------------------+           PSV cm/sEDV cmsDescribeArm Pressure (mmHG) +----------+--------+-------+--------+-------------------+ Subclavian162                                        +----------+--------+-------+--------+-------------------+ +---------+--------+--+--------+--+ VertebralPSV cm/s69EDV cm/s21 +---------+--------+--+--------+--+  Left Carotid Findings: +----------+--------+--------+--------+------------------+---------+           PSV cm/sEDV cm/sStenosisPlaque DescriptionComments  +----------+--------+--------+--------+------------------+---------+ CCA Prox  140     48              heterogenous                +----------+--------+--------+--------+------------------+---------+ CCA Distal131     32  heterogenous                 +----------+--------+--------+--------+------------------+---------+ ICA Prox  140     48      40-59%  calcific          Shadowing +----------+--------+--------+--------+------------------+---------+ ICA Mid   108     48                                          +----------+--------+--------+--------+------------------+---------+ ICA Distal101     38                                          +----------+--------+--------+--------+------------------+---------+ ECA       135     37                                          +----------+--------+--------+--------+------------------+---------+ +----------+--------+--------+--------+-------------------+           PSV cm/sEDV cm/sDescribeArm Pressure (mmHG) +----------+--------+--------+--------+-------------------+ [id number]                                          +----------+--------+--------+--------+-------------------+ +---------+--------+--+--------+--+ VertebralPSV cm/s58EDV cm/s22 +---------+--------+--+--------+--+   Summary: Right Carotid: Velocities in the right ICA are consistent with a 40-59%                stenosis. Left Carotid: Velocities in the left ICA are consistent with a 40-59% stenosis. Vertebrals:  Bilateral vertebral arteries demonstrate antegrade flow. Subclavians: Normal flow hemodynamics were seen in bilateral subclavian              arteries. *See table(s) above for measurements and observations.  Electronically signed by Antony Contras MD on 01/07/2021 at 9:02:50 AM.    Final    [place] EKG SITE RITE  Result Date: 01/20/2021 If Site Rite image not attached, placement could not be confirmed due to current cardiac rhythm.  [place] EKG SITE RITE  Result Date: 01/10/2021 If [facility] image not attached, placement could not be confirmed due to current cardiac rhythm.   Colonoscopy 01/30/2021 Dr. Benson Norway Impression:       - Five 3 to 8 mm polyps at the recto-sigmoid colon,                            in the  descending colon and in the transverse                            colon, removed with a cold snare. Resected and                            retrieved.  Recommendation: - Return patient to hospital ward for ongoing care.                           - Resume regular diet.                           -  Continue present medications.                           - Await pathology results.                           - Repeat colonoscopy in 3 years for surveillance.                           - Resume Eliquis in 3 days.  EGD 01/30/2021 Dr. Benson Norway Impression:       - Normal esophagus.                           - Normal stomach.                           - Erythematous duodenopathy.                           - No specimens collected.  Recommendation: - Resume Eliquis in 3 days.                           - Follow HGB and transfuse as necessary.  Subjective: Feels well. No abd pain, N/V/D, chest pain, palpitations, shortness of breath or dizziness/lightheadedness. No further bleeding since admission and hgb is stable x3 days.   Discharge Exam: Vitals:   01/31/21 0420 01/31/21 0836  BP: 103/66 (!) 93/54  Pulse: 78 88  Resp: 16 18  Temp: 98.1 F (36.7 C) 98.5 F (36.9 C)  SpO2: 93% 99%   General: Pt is alert, awake, not in acute distress Cardiovascular: RRR, S1/S2 +, no rubs, no gallops Respiratory: CTA bilaterally, no wheezing, no rhonchi Abdominal: Soft, NT, ND, bowel sounds + Extremities: No edema, no cyanosis  Labs: BNP (last 3 results) Recent Labs    01/04/21 0322  BNP 536.4*   Basic Metabolic Panel: Recent Labs  Lab 01/28/21 1035 01/29/21 0254 01/30/21 0251 01/31/21 0503  NA 141 135 137 138  K 3.8 3.6 4.4 4.0  CL 107 100 104 107  CO2 '26 27 25 23  ' GLUCOSE 109* 96 102* 106*  BUN '23 18 13 15  ' CREATININE 1.28* 1.37* 1.13 1.48*  CALCIUM 8.4* 8.2* 8.4* 8.5*   Liver Function Tests: Recent Labs  Lab 01/28/21 1035  AST 11*  ALT 10  ALKPHOS 43  BILITOT 0.5  PROT 5.8*   ALBUMIN 2.6*   No results for input(s): LIPASE, AMYLASE in the last 168 hours. No results for input(s): AMMONIA in the last 168 hours. CBC: Recent Labs  Lab 01/28/21 1035 01/28/21 1808 01/28/21 2203 01/29/21 0254 01/29/21 1428 01/30/21 0251 01/31/21 0503  WBC 4.4  --   --  5.3  --  6.1 5.5  HGB 6.1*   < > 7.6* 7.3* 8.1* 7.4* 7.4*  HCT 19.5*   < > 23.6* 21.8* 24.7* 22.2* 23.2*  MCV 107.1*  --   --  102.8*  --  104.2* 105.9*  PLT 198  --   --  242  --  212 237   < > = values in this interval not displayed.   Cardiac Enzymes: No results for input(s): CKTOTAL, CKMB, CKMBINDEX, TROPONINI in the last 168 hours. BNP: Invalid input(s): POCBNP  CBG: No results for input(s): GLUCAP in the last 168 hours. D-Dimer No results for input(s): DDIMER in the last 72 hours. Hgb A1c No results for input(s): HGBA1C in the last 72 hours. Lipid Profile No results for input(s): CHOL, HDL, LDLCALC, TRIG, CHOLHDL, LDLDIRECT in the last 72 hours. Thyroid function studies No results for input(s): TSH, T4TOTAL, T3FREE, THYROIDAB in the last 72 hours.  Invalid input(s): FREET3 Anemia work up No results for input(s): VITAMINB12, FOLATE, FERRITIN, TIBC, IRON, RETICCTPCT in the last 72 hours. Urinalysis    Component Value Date/Time   COLORURINE YELLOW 01/04/2021 1729   APPEARANCEUR CLEAR 01/04/2021 1729   LABSPEC 1.012 01/04/2021 1729   PHURINE 5.0 01/04/2021 1729   GLUCOSEU NEGATIVE 01/04/2021 1729   HGBUR MODERATE (A) 01/04/2021 1729   BILIRUBINUR NEGATIVE 01/04/2021 1729   KETONESUR NEGATIVE 01/04/2021 1729   PROTEINUR NEGATIVE 01/04/2021 1729   NITRITE NEGATIVE 01/04/2021 1729   LEUKOCYTESUR NEGATIVE 01/04/2021 1729    Microbiology Recent Results (from the past 240 hour(s))  Resp Panel by RT-PCR (Flu A&B, Covid) Nasopharyngeal Swab     Status: None   Collection Time: 01/28/21 10:27 AM   Specimen: Nasopharyngeal Swab; Nasopharyngeal(NP) swabs in vial transport medium  Result Value Ref  Range Status   SARS Coronavirus 2 by RT PCR NEGATIVE NEGATIVE Final    Comment: (NOTE) SARS-CoV-2 target nucleic acids are NOT DETECTED.  The SARS-CoV-2 RNA is generally detectable in upper respiratory specimens during the acute phase of infection. The lowest concentration of SARS-CoV-2 viral copies this assay can detect is 138 copies/mL. A negative result does not preclude SARS-Cov-2 infection and should not be used as the sole basis for treatment or other patient management decisions. A negative result may occur with  improper specimen collection/handling, submission of specimen other than nasopharyngeal swab, presence of viral mutation(s) within the areas targeted by this assay, and inadequate number of viral copies(<138 copies/mL). A negative result must be combined with clinical observations, patient history, and epidemiological information. The expected result is Negative.  Fact Sheet for Patients:  EntrepreneurPulse.com.au  Fact Sheet for Healthcare Providers:  IncredibleEmployment.be  This test is no t yet approved or cleared by the [place] FDA and  has been authorized for detection and/or diagnosis of SARS-CoV-2 by FDA under an Emergency Use Authorization (EUA). This EUA will remain  in effect (meaning this test can be used) for the duration of the COVID-19 declaration under Section 564(b)(1) of the Act, 21 U.S.C.section 360bbb-3(b)(1), unless the authorization is terminated  or revoked sooner.       Influenza A by PCR NEGATIVE NEGATIVE Final   Influenza B by PCR NEGATIVE NEGATIVE Final    Comment: (NOTE) The Xpert Xpress SARS-CoV-2/FLU/RSV plus assay is intended as an aid in the diagnosis of influenza from Nasopharyngeal swab specimens and should not be used as a sole basis for treatment. Nasal washings and aspirates are unacceptable for Xpert Xpress SARS-CoV-2/FLU/RSV testing.  Fact Sheet for  Patients: EntrepreneurPulse.com.au  Fact Sheet for Healthcare Providers: IncredibleEmployment.be  This test is not yet approved or cleared by the [place] FDA and has been authorized for detection and/or diagnosis of SARS-CoV-2 by FDA under an Emergency Use Authorization (EUA). This EUA will remain in effect (meaning this test can be used) for the duration of the COVID-19 declaration under Section 564(b)(1) of the Act, 21 U.S.C. section 360bbb-3(b)(1), unless the authorization is terminated or revoked.  Performed at [facility], [place] [address], [place], [place] [address]  MRSA PCR Screening     Status: None   Collection Time: 01/28/21  3:32 PM   Specimen: Nasopharyngeal  Result Value Ref Range Status   MRSA by PCR NEGATIVE NEGATIVE Final    Comment:        The GeneXpert MRSA Assay (FDA approved for NASAL specimens only), is one component of a comprehensive MRSA colonization surveillance program. It is not intended to diagnose MRSA infection nor to guide or monitor treatment for MRSA infections. Performed at [facility], [place] [address] [place], [place] [address]     Time coordinating discharge: Approximately 40 minutes  Patrecia Pour, MD  [facility] 01/31/2021, 9:29 AM

## 2021-01-31 NOTE — TOC Progression Note (Addendum)
Transition of Care [facility]) - Progression Note    Patient Details  Name: Donald Harrington MRN: [id number] Date of Birth: January 10, 1956  [facility] CM/SW Contact  Ross Ludwig, [place] Phone Number: 01/31/2021, 10:56 AM  Clinical Narrative:    Patient from [facility], [place] attempted to contact admissions worker left a message awaiting for call back.  11:15am  CSW spoke to [facility] Admissions worker, they need to restart insurance authorization for patient.  CSW notified attending physician and bedside nurse.  CSW updated patient via phone that CSW is waiting for insurance authorization.   Expected Discharge Plan: [place] Barriers to Discharge: Continued Medical Work up  Expected Discharge Plan and Services Expected Discharge Plan: [place]   Discharge Planning Services: CM Consult Post Acute Care Choice: [place] Living arrangements for the past 2 months: Apartment Expected Discharge Date: 01/31/21                                     Social Determinants of Health (SDOH) Interventions    Readmission Risk Interventions No flowsheet data found.

## 2021-01-31 NOTE — NC FL2 (Signed)
[place] LEVEL OF CARE SCREENING TOOL     IDENTIFICATION  Patient Name: Donald Harrington Birthdate: [birth year] Sex: male Admission Date (Current Location): 01/28/2021  [facility] and [place] Number:  Herbalist and Address:  [facility],  [place], [place]      Provider Number: [id number]  Attending Physician Name and Address:  Patrecia Pour, MD  Relative Name and Phone Number:  Rolly Salter Daughter   [phone]    Current Level of Care: Hospital Recommended Level of Care: [place] Prior Approval Number:    Date Approved/Denied:   PASRR Number: [id number]  Discharge Plan: SNF    Current Diagnoses: Patient Active Problem List   Diagnosis Date Noted  . Hypotension 01/28/2021  . Dental caries   . Chronic apical periodontitis   . Chronic periodontal disease   . Bacteremia due to Streptococcus   . Duodenal ulcer   . Acute gastric erosion   . Acute embolic stroke ([place]) 32/99/2426  . GI bleed 01/03/2021  . Acute blood loss anemia 01/03/2021  . Cough 01/03/2021  . PAF (paroxysmal atrial fibrillation) ([place]) 01/03/2021  . History of aortic valve replacement 01/03/2021  . History of mitral valve replacement 01/03/2021    Orientation RESPIRATION BLADDER Height & Weight     Self,Time,Situation,Place  Normal Continent Weight: 161 lb 9.6 oz (73.3 kg) Height:  5\' 10"  (177.8 cm)  BEHAVIORAL SYMPTOMS/MOOD NEUROLOGICAL BOWEL NUTRITION STATUS      Continent Diet (Regular diet)  AMBULATORY STATUS COMMUNICATION OF NEEDS Skin   Limited Assist Verbally Normal                       Personal Care Assistance Level of Assistance  Feeding,Dressing,Bathing Bathing Assistance: Limited assistance Feeding assistance: Limited assistance Dressing Assistance: Limited assistance     Functional Limitations Info  Sight,Hearing,Speech Sight Info: Adequate Hearing Info: Adequate Speech Info: Adequate    SPECIAL  CARE FACTORS FREQUENCY  PT (By licensed PT),OT (By licensed OT)     PT Frequency: Minimum 5x a week OT Frequency: Minimum 5x a week            Contractures Contractures Info: Not present    Additional Factors Info  Code Status,Allergies Code Status Info: Full Allergies Info: NKA           Current Medications (01/31/2021):  This is the current hospital active medication list Current Facility-Administered Medications  Medication Dose Route Frequency Provider Last Rate Last Admin  . 0.9 %  sodium chloride infusion   Intravenous Continuous Patrecia Pour, MD 10 mL/hr at 01/30/21 1901 Restarted at 01/30/21 1901  . acetaminophen (TYLENOL) tablet 650 mg  650 mg Oral Q6H PRN Patrecia Pour, MD   650 mg at 01/31/21 0800  . Chlorhexidine Gluconate Cloth 2 % PADS 6 each  6 each Topical Daily Patrecia Pour, MD   6 each at 01/30/21 [phone]  . levothyroxine (SYNTHROID) tablet 25 mcg  25 mcg Oral Daily Patrecia Pour, MD   25 mcg at 01/31/21 0542  . MEDLINE mouth rinse  15 mL Mouth Rinse BID Patrecia Pour, MD   15 mL at 01/30/21 1014  . melatonin tablet 3 mg  3 mg Oral QHS PRN Patrecia Pour, MD   3 mg at 01/30/21 2035  . pantoprazole (PROTONIX) injection 40 mg  40 mg Intravenous Q12H Patrecia Pour, MD   40 mg at 01/30/21 2100  .  penicillin G potassium 12 Million Units in dextrose 5 % 500 mL continuous infusion  12 Million Units Intravenous Q12H Patrecia Pour, MD 41.7 mL/hr at 01/30/21 2001 12 Million Units at 01/30/21 2001  . sodium chloride flush (NS) 0.9 % injection 3 mL  3 mL Intravenous Q12H Patrecia Pour, MD   3 mL at 01/30/21 1014  . traMADol (ULTRAM) tablet 50 mg  50 mg Oral Q6H PRN Patrecia Pour, MD   50 mg at 01/29/21 1530     Discharge Medications: Please see discharge summary for a list of discharge medications.  Relevant Imaging Results:  Relevant Lab Results:   Additional Information SSN Rolly Salter Daughter   [phone] IV antibiotic penicillin G potassium 12 Million  Units in dextrose 5 % 500 mL continuous infusion, Every 12 hours Route: IV  Solangel Mcmanaway, [place] Broom, LCSW

## 2021-02-01 LAB — BASIC METABOLIC PANEL
Anion gap: 8 (ref 5–15)
BUN: 14 mg/dL (ref 8–23)
CO2: 26 mmol/L (ref 22–32)
Calcium: 8.7 mg/dL — ABNORMAL LOW (ref 8.9–10.3)
Chloride: 106 mmol/L (ref 98–111)
Creatinine, Ser: 1.11 mg/dL (ref 0.61–1.24)
GFR, Estimated: >60 mL/min (ref >60)
Glucose, Bld: 98 mg/dL (ref 70–99)
Potassium: 4.1 mmol/L (ref 3.5–5.1)
Sodium: 140 mmol/L (ref 135–145)

## 2021-02-01 LAB — CBC
HCT: 21.8 % — ABNORMAL LOW (ref 39.0–52.0)
Hemoglobin: 7 g/dL — ABNORMAL LOW (ref 13.0–17.0)
MCH: 34.5 pg — ABNORMAL HIGH (ref 26.0–34.0)
MCHC: 32.1 g/dL (ref 30.0–36.0)
MCV: 107.4 fL — ABNORMAL HIGH (ref 80.0–100.0)
Platelets: 215 10*3/uL (ref 150–400)
RBC: 2.03 MIL/uL — ABNORMAL LOW (ref 4.22–5.81)
RDW: 15.9 % — ABNORMAL HIGH (ref 11.5–15.5)
WBC: 5 10*3/uL (ref 4.0–10.5)
nRBC: 0 % (ref 0.0–0.2)

## 2021-02-01 LAB — RETICULOCYTES
Immature Retic Fract: 23.2 % — ABNORMAL HIGH (ref 2.3–15.9)
RBC.: 2.19 MIL/uL — ABNORMAL LOW (ref 4.22–5.81)
Retic Count, Absolute: 140.8 10*3/uL (ref 19.0–186.0)
Retic Ct Pct: 6.4 % — ABNORMAL HIGH (ref 0.4–3.1)

## 2021-02-01 LAB — IRON AND TIBC
Iron: 37 ug/dL — ABNORMAL LOW (ref 45–182)
Saturation Ratios: 15 % — ABNORMAL LOW (ref 17.9–39.5)
TIBC: 246 ug/dL — ABNORMAL LOW (ref 250–450)
UIBC: 209 ug/dL

## 2021-02-01 LAB — VITAMIN B12: Vitamin B-12: 84 pg/mL — ABNORMAL LOW (ref 180–914)

## 2021-02-01 LAB — HEMOGLOBIN AND HEMATOCRIT, BLOOD
HCT: 23.5 % — ABNORMAL LOW (ref 39.0–52.0)
Hemoglobin: 7.5 g/dL — ABNORMAL LOW (ref 13.0–17.0)

## 2021-02-01 LAB — FERRITIN: Ferritin: 33 ng/mL (ref 24–336)

## 2021-02-01 LAB — FOLATE: Folate: 9 ng/mL (ref >5.9)

## 2021-02-01 NOTE — Progress Notes (Signed)
PROGRESS NOTE  Donald Harrington  MGN:[id number] DOB: Jun 21, 1956 DOA: 01/28/2021 PCP: Patient, No Pcp Per (Inactive)   Brief Narrative: Donald Harrington is a 65 y.o. 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 In the ED he was afebrile, hypotensive with Hgb 6.1, SCr 1.28, BUN 23, +FOBT, INR 1.3. 1u PRBCs and IVF ordered and pt admitted to SDU with GI recommending full day prep 4/13 with colonoscopy 4/14. Colonoscopy and EGD were performed on 4/14 revealing erythematous duodenopathy and colonic polyps without clear source of bleeding. Eliquis has been held and should be held for 3 days from the time of colonoscopy per GI recommendations. Hemoglobin has stabilized without further transfusions and bleeding has remained resolved throughout admission. He is stable to return to SNF.  Endoscopy 01/05/2021: Erosive gastropathy with no bleeding and no stigmata of recent bleeding, nonbleeding duodenal ulcer without stigmata of bleeding likely due to NSAIDs and steroids.  Started on PPI twice daily and okay to restart anticoagulation (Coumadin at the time).  A colonoscopy was recommended however not done due to poor prep  Assessment & Plan: Principal Problem:   Acute blood loss anemia Active Problems:   GI bleed   PAF (paroxysmal atrial fibrillation) (HCC)   History of aortic valve replacement   History of mitral valve replacement   Bacteremia due to Streptococcus   Hypotension  Acute blood loss anemia due to recurrent GI bleeding: Possibly related to duodenopathy or polyps which were resected at colonoscopy.  - s/p 1u PRBC 4/12, hgbdown slightly 4/16, will repeat H/H with transfusion  threshold <7g/dl or 8g/dl if hypotension recurs.  - Add anemia panel to repeat collection (may be affected by previous transfusion) as indices are increasingly macrocytic. - Hold anticoagulation - Continue PPI - Follow up pathology results per GI from colonoscopy - Repeat colonoscopy recommended in 3 years per GI.  PAF, History of bioprosthetic AVR, MVR:  -Can restart home medications with stabilized vital signs. - Hold eliquis. May consider LAA clipping since failed coumadin (emboli) and now bleeding on eliquis.  Hypotension: Due to anemiaon chronically soft baseline. This has stabilized and improved.  Multifocal embolic CVA: Precipitated by subtherapeutic INR. - Restart anticoagulation as soon as possible, 4/17 per GI.  - Monitor neuro exam closely, stable. 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 Diagnosed at his last hospitalization and discharged on 01/13/21 with Gentamicin and Penicillin IV x 2 weeks followed by 4 additional weeks of Penicillin.  - Continue penicillin   DVT prophylaxis: SCDs Code Status: Full Family Communication: None at bedside Disposition Plan:  Status is: Inpatient  Remains inpatient appropriate because:Hemodynamically unstable and Ongoing diagnostic testing needed not appropriate for outpatient work up   Dispo: The patient is from: SNF              Anticipated d/c is to: SNF pending insurance authorization              Patient currently is not medically stable to d/c.   Difficult to place patient No  Consultants:   GI  Procedures:  Colonoscopy 01/30/2021 Dr. Benson Norway Impression: - Five 3 to 8 mm polyps at the  recto-sigmoid colon,  in the descending colon and in the transverse  colon, removed with a cold snare. Resected and  retrieved.  Recommendation: - Return patient  to hospital ward for ongoing care. - Resume regular diet. - Continue present medications. - Await pathology results. - Repeat colonoscopy in 3 years for surveillance. - Resume Eliquis in 3 days.  EGD 01/30/2021 Dr. Benson Norway Impression: - Normal esophagus. - Normal stomach. - Erythematous duodenopathy. - No specimens collected.  Recommendation: - Resume Eliquis in 3 days. - Follow HGB and transfuse as necessary.  Antimicrobials:  Penicillin G  Subjective: Eating well, no N/V/D, no fever, denies bleeding. No BM since endoscopic procedures/clean out  Objective: Vitals:   01/31/21 0836 01/31/21 1428 01/31/21 2143 02/01/21 0507  BP: (!) 93/54 (!) 98/58 110/66 105/68  Pulse: 88 70 88 73  Resp: 18 16 18 16   Temp: 98.5 F (36.9 C) 98.4 F (36.9 C) 99.7 F (37.6 C) 98.4 F (36.9 C)  TempSrc:  Oral Oral Oral  SpO2: 99% 98% 98% 99%  Weight:      Height:        Intake/Output Summary (Last 24 hours) at 02/01/2021 1012 Last data filed at 02/01/2021 1000 Gross per 24 hour  Intake 239 ml  Output 1550 ml  Net -1311 ml   Filed Weights   01/28/21 1600  Weight: 73.3 kg   Gen: 65 y.o. male in no distress Pulm: Nonlabored breathing room air. Clear. CV: Regular rate and rhythm. Stable II/VI systolic murmur most at LUSB, but diffusely through the base, no rub, or gallop. No JVD, no dependent edema. GI: Abdomen soft, non-tender, non-distended, with normoactive bowel sounds.  Ext: Warm, no deformities Skin: No new rashes, lesions or ulcers on visualized skin. RUA PICC c/d/i. Neuro: Alert and oriented. No focal neurological deficits. Psych: Judgement and insight appear fair. Mood euthymic & affect congruent. Behavior is appropriate.    Data Reviewed: I  have personally reviewed following labs and imaging studies  CBC: Recent Labs  Lab 01/28/21 1035 01/28/21 1808 01/29/21 0254 01/29/21 1428 01/30/21 0251 01/31/21 0503 02/01/21 0521  WBC 4.4  --  5.3  --  6.1 5.5 5.0  HGB 6.1*   < > 7.3* 8.1* 7.4* 7.4* 7.0*  HCT 19.5*   < > 21.8* 24.7* 22.2* 23.2* 21.8*  MCV 107.1*  --  102.8*  --  104.2* 105.9* 107.4*  PLT 198  --  242  --  212 237 215   < > = values in this interval not displayed.   Basic Metabolic Panel: Recent Labs  Lab 01/28/21 1035 01/29/21 0254 01/30/21 0251 01/31/21 0503 02/01/21 0521  NA 141 135 137 138 140  K 3.8 3.6 4.4 4.0 4.1  CL 107 100 104 107 106  CO2 26 27 25 23 26   GLUCOSE 109* 96 102* 106* 98  BUN 23 18 13 15 14   CREATININE 1.28* 1.37* 1.13 1.48* 1.11  CALCIUM 8.4* 8.2* 8.4* 8.5* 8.7*   GFR: Estimated Creatinine Clearance: 67.6 mL/min (by C-G formula based on SCr of 1.11 mg/dL). Liver Function Tests: Recent Labs  Lab 01/28/21 1035  AST 11*  ALT 10  ALKPHOS 43  BILITOT 0.5  PROT 5.8*  ALBUMIN 2.6*   No results for input(s): LIPASE, AMYLASE in the last 168 hours. No results for input(s): AMMONIA in the last 168 hours. Coagulation Profile: Recent Labs  Lab 01/28/21 1035  INR 1.3*   Cardiac Enzymes: No results for input(s): CKTOTAL, CKMB, CKMBINDEX, TROPONINI in  the last 168 hours. BNP (last 3 results) No results for input(s): PROBNP in the last 8760 hours. HbA1C: No results for input(s): HGBA1C in the last 72 hours. CBG: No results for input(s): GLUCAP in the last 168 hours. Lipid Profile: No results for input(s): CHOL, HDL, LDLCALC, TRIG, CHOLHDL, LDLDIRECT in the last 72 hours. Thyroid Function Tests: No results for input(s): TSH, T4TOTAL, FREET4, T3FREE, THYROIDAB in the last 72 hours. Anemia Panel: No results for input(s): VITAMINB12, FOLATE, FERRITIN, TIBC, IRON, RETICCTPCT in the last 72 hours. Urine analysis:    Component Value Date/Time   COLORURINE YELLOW 01/04/2021  1729   APPEARANCEUR CLEAR 01/04/2021 1729   LABSPEC 1.012 01/04/2021 1729   PHURINE 5.0 01/04/2021 1729   GLUCOSEU NEGATIVE 01/04/2021 1729   HGBUR MODERATE (A) 01/04/2021 1729   BILIRUBINUR NEGATIVE 01/04/2021 1729   KETONESUR NEGATIVE 01/04/2021 1729   PROTEINUR NEGATIVE 01/04/2021 1729   NITRITE NEGATIVE 01/04/2021 1729   LEUKOCYTESUR NEGATIVE 01/04/2021 1729   Recent Results (from the past 240 hour(s))  Resp Panel by RT-PCR (Flu A&B, Covid) Nasopharyngeal Swab     Status: None   Collection Time: 01/28/21 10:27 AM   Specimen: Nasopharyngeal Swab; Nasopharyngeal(NP) swabs in vial transport medium  Result Value Ref Range Status   SARS Coronavirus 2 by RT PCR NEGATIVE NEGATIVE Final    Comment: (NOTE) SARS-CoV-2 target nucleic acids are NOT DETECTED.  The SARS-CoV-2 RNA is generally detectable in upper respiratory specimens during the acute phase of infection. The lowest concentration of SARS-CoV-2 viral copies this assay can detect is 138 copies/mL. A negative result does not preclude SARS-Cov-2 infection and should not be used as the sole basis for treatment or other patient management decisions. A negative result may occur with  improper specimen collection/handling, submission of specimen other than nasopharyngeal swab, presence of viral mutation(s) within the areas targeted by this assay, and inadequate number of viral copies(<138 copies/mL). A negative result must be combined with clinical observations, patient history, and epidemiological information. The expected result is Negative.  Fact Sheet for Patients:  EntrepreneurPulse.com.au  Fact Sheet for Healthcare Providers:  IncredibleEmployment.be  This test is no t yet approved or cleared by the [place] FDA and  has been authorized for detection and/or diagnosis of SARS-CoV-2 by FDA under an Emergency Use Authorization (EUA). This EUA will remain  in effect (meaning this test  can be used) for the duration of the COVID-19 declaration under Section 564(b)(1) of the Act, 21 U.S.C.section 360bbb-3(b)(1), unless the authorization is terminated  or revoked sooner.       Influenza A by PCR NEGATIVE NEGATIVE Final   Influenza B by PCR NEGATIVE NEGATIVE Final    Comment: (NOTE) The Xpert Xpress SARS-CoV-2/FLU/RSV plus assay is intended as an aid in the diagnosis of influenza from Nasopharyngeal swab specimens and should not be used as a sole basis for treatment. Nasal washings and aspirates are unacceptable for Xpert Xpress SARS-CoV-2/FLU/RSV testing.  Fact Sheet for Patients: EntrepreneurPulse.com.au  Fact Sheet for Healthcare Providers: IncredibleEmployment.be  This test is not yet approved or cleared by the [place] FDA and has been authorized for detection and/or diagnosis of SARS-CoV-2 by FDA under an Emergency Use Authorization (EUA). This EUA will remain in effect (meaning this test can be used) for the duration of the COVID-19 declaration under Section 564(b)(1) of the Act, 21 U.S.C. section 360bbb-3(b)(1), unless the authorization is terminated or revoked.  Performed at [facility], [place] [address] [place], [place] [address]  MRSA PCR Screening     Status: None   Collection Time: 01/28/21  3:32 PM   Specimen: Nasopharyngeal  Result Value Ref Range Status   MRSA by PCR NEGATIVE NEGATIVE Final    Comment:        The GeneXpert MRSA Assay (FDA approved for NASAL specimens only), is one component of a comprehensive MRSA colonization surveillance program. It is not intended to diagnose MRSA infection nor to guide or monitor treatment for MRSA infections. Performed at [facility], [place] [address], [place], [place] [address]       Radiology Studies: No results found.  Scheduled Meds: . Chlorhexidine Gluconate Cloth  6 each Topical Daily  . levothyroxine  25 mcg  Oral Daily  . mouth rinse  15 mL Mouth Rinse BID  . pantoprazole (PROTONIX) IV  40 mg Intravenous Q12H  . sodium chloride flush  3 mL Intravenous Q12H   Continuous Infusions: . sodium chloride 10 mL/hr at 01/30/21 1901  . penicillin g continuous IV infusion 12 Million Units (02/01/21 0918)     LOS: 4 days   Time spent: 35 minutes.  Patrecia Pour, MD [facility] [email or web address] 02/01/2021, 10:12 AM

## 2021-02-01 NOTE — Plan of Care (Signed)

## 2021-02-02 LAB — CBC
HCT: 22 % — ABNORMAL LOW (ref 39.0–52.0)
Hemoglobin: 6.9 g/dL — CL (ref 13.0–17.0)
MCH: 34 pg (ref 26.0–34.0)
MCHC: 31.4 g/dL (ref 30.0–36.0)
MCV: 108.4 fL — ABNORMAL HIGH (ref 80.0–100.0)
Platelets: 226 10*3/uL (ref 150–400)
RBC: 2.03 MIL/uL — ABNORMAL LOW (ref 4.22–5.81)
RDW: 15.8 % — ABNORMAL HIGH (ref 11.5–15.5)
WBC: 4.7 10*3/uL (ref 4.0–10.5)
nRBC: 0 % (ref 0.0–0.2)

## 2021-02-02 LAB — BASIC METABOLIC PANEL
Anion gap: 8 (ref 5–15)
BUN: 13 mg/dL (ref 8–23)
CO2: 25 mmol/L (ref 22–32)
Calcium: 8.7 mg/dL — ABNORMAL LOW (ref 8.9–10.3)
Chloride: 107 mmol/L (ref 98–111)
Creatinine, Ser: 1.31 mg/dL — ABNORMAL HIGH (ref 0.61–1.24)
GFR, Estimated: >60 mL/min (ref >60)
Glucose, Bld: 92 mg/dL (ref 70–99)
Potassium: 4.1 mmol/L (ref 3.5–5.1)
Sodium: 140 mmol/L (ref 135–145)

## 2021-02-02 LAB — PREPARE RBC (CROSSMATCH)

## 2021-02-02 LAB — HEMOGLOBIN AND HEMATOCRIT, BLOOD
HCT: 26.5 % — ABNORMAL LOW (ref 39.0–52.0)
Hemoglobin: 8.6 g/dL — ABNORMAL LOW (ref 13.0–17.0)

## 2021-02-02 MED ORDER — SODIUM CHLORIDE 0.9% FLUSH: 10.0000 mL | INTRAVENOUS | Status: DC | PRN

## 2021-02-02 MED ORDER — SODIUM CHLORIDE 0.9% FLUSH
10.0000 mL | Freq: Two times a day (BID) | INTRAVENOUS | Status: DC
  Administered 2021-02-02 – 2021-02-03 (×2): 10 mL

## 2021-02-02 MED ORDER — SODIUM CHLORIDE 0.9% IV SOLUTION: Freq: Once | INTRAVENOUS | Status: AC

## 2021-02-02 MED ORDER — SODIUM CHLORIDE 0.9 % IV SOLN
125.0000 mg | Freq: Once | INTRAVENOUS | Status: AC
  Administered 2021-02-02: 125 mg via INTRAVENOUS
  Filled 2021-02-02: qty 10

## 2021-02-02 MED ORDER — CYANOCOBALAMIN 1000 MCG/ML IJ SOLN
1000.0000 ug | Freq: Every day | INTRAMUSCULAR | Status: AC
  Administered 2021-02-02 – 2021-02-03 (×2): 1000 ug via INTRAMUSCULAR
  Filled 2021-02-02 (×2): qty 1

## 2021-02-02 NOTE — Progress Notes (Signed)
PROGRESS NOTE  Donald Harrington  MIW:[id number] DOB: 09/09/1956 DOA: 01/28/2021 PCP: Patient, No Pcp Per (Inactive)   Brief Narrative: Donald Harrington is a 65 y.o. 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 with a bloody BM and abnormal lab work with Hb 6. In the ED he was afebrile, hypotensive with Hgb 6.1, SCr 1.28, BUN 23, +FOBT, INR 1.3. 1u PRBCs and IVF ordered and pt admitted to SDU with GI recommending full day prep 4/13 with colonoscopy 4/14. Colonoscopy and EGD were performed on 4/14 revealing erythematous duodenopathy and colonic polyps without clear source of bleeding. Eliquis has been held and should be held for 3 days from the time of colonoscopy per GI recommendations. Hemoglobin has stabilized without further transfusions and bleeding has remained resolved throughout admission. He is stable to return to SNF.  Endoscopy 01/05/2021: Erosive gastropathy with no bleeding and no stigmata of recent bleeding, nonbleeding duodenal ulcer without stigmata of bleeding likely due to NSAIDs and steroids.  Started on PPI twice daily and okay to restart anticoagulation (Coumadin at the time).  A colonoscopy was recommended however not done due to poor prep  Assessment & Plan: Principal Problem:   Acute blood loss anemia Active Problems:   GI bleed   PAF (paroxysmal atrial fibrillation) (HCC)   History of aortic valve replacement   History of mitral valve replacement   Bacteremia due to Streptococcus   Hypotension  Acute blood loss anemia due to recurrent GI bleeding: Possibly related to duodenopathy or polyps which were resected at colonoscopy.  - s/p 1u PRBC 4/12, will order additional unit 4/17 with hgb 6.9g/dl, soft BP.   - Start IM B12 level of 84 with macrocytosis.  - Give IV iron x1 as posttransfusion specimen still shows low iron.  - Hold anticoagulation - Continue PPI - Follow up pathology results per GI from colonoscopy - Repeat colonoscopy recommended in 3 years per GI.  PAF, History of bioprosthetic AVR, MVR:  -Can restart home medications with stabilized vital signs. - Hold eliquis. May consider LAA clipping since failed coumadin (emboli) and now bleeding on eliquis.  Hypotension: Due to anemiaon chronically soft baseline. This has stabilized.  Multifocal embolic CVA: Precipitated by subtherapeutic INR. - Restart anticoagulation as soon as possible, 4/18 per GI.  - Monitor neuro exam closely, stable. 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 Diagnosed at his last hospitalization and discharged on 01/13/21 with Gentamicin and Penicillin IV x 2 weeks followed by 4 additional weeks of Penicillin.  - Continue penicillin   DVT prophylaxis: SCDs Code Status: Full Family Communication: None at bedside Disposition Plan:  Status is: Inpatient  Remains inpatient appropriate because:Hemodynamically unstable and Ongoing diagnostic testing needed not appropriate for outpatient work up   Dispo: The patient is from: SNF              Anticipated d/c is to: SNF pending insurance authorization              Patient currently is not medically stable to d/c.   Difficult to place patient No  Consultants:   GI  Procedures:  Colonoscopy 01/30/2021 Dr. Benson Norway Impression: - Five 3 to 8 mm polyps at the recto-sigmoid colon,  in the descending colon and in the transverse  colon, removed with a cold snare. Resected and  retrieved.  Recommendation: - Return patient to hospital ward for ongoing care. -  Resume regular diet. - Continue present medications. - Await pathology results. - Repeat colonoscopy in 3 years for surveillance. - Resume Eliquis in 3 days.  EGD 01/30/2021 Dr. Benson Norway Impression: - Normal esophagus. - Normal stomach. - Erythematous duodenopathy. - No specimens collected.  Recommendation: - Resume Eliquis in 3 days. - Follow HGB and transfuse as necessary.  Antimicrobials:  Penicillin G  Subjective: Eating well, getting around ok, but BPs soft. No bleeding or abd pain. No dyspnea or chest pain or palpitations reported.   Objective: Vitals:   02/01/21 0507 02/01/21 1322 02/01/21 2011 02/02/21 0513  BP: 105/68 (!) 94/59 118/67 105/70  Pulse: 73 92 79 77  Resp: 16 17 17 20   Temp: 98.4 F (36.9 C) 98.6 F (37 C) 98.1 F (36.7 C) 99 F (37.2 C)  TempSrc: Oral Oral Oral   SpO2: 99% 96% 98% 97%  Weight:      Height:        Intake/Output Summary (Last 24 hours) at 02/02/2021 0957 Last data filed at 02/02/2021 7564 Gross per 24 hour  Intake 475 ml  Output 1220 ml  Net -745 ml   Filed Weights   01/28/21 1600  Weight: 73.3 kg   Gen: 65 y.o. male in no distress Pulm: Nonlabored breathing room air. Clear. CV: Regular rate and rhythm. Stable systolic murmur at RUSB/base. No rub, or gallop. No JVD, no dependent edema. GI: Abdomen soft, non-tender, non-distended, with normoactive bowel sounds.  Ext: Warm, no deformities Skin: No new rashes, lesions or ulcers on visualized skin. Neuro: Alert and oriented. No focal neurological deficits. Psych: Judgement and insight appear fair. Mood euthymic & affect congruent. Behavior is appropriate.    Data Reviewed: I have personally reviewed following labs and imaging studies  CBC: Recent Labs  Lab  01/29/21 0254 01/29/21 1428 01/30/21 0251 01/31/21 0503 02/01/21 0521 02/01/21 1138 02/02/21 0633  WBC 5.3  --  6.1 5.5 5.0  --  4.7  HGB 7.3*   < > 7.4* 7.4* 7.0* 7.5* 6.9*  HCT 21.8*   < > 22.2* 23.2* 21.8* 23.5* 22.0*  MCV 102.8*  --  104.2* 105.9* 107.4*  --  108.4*  PLT 242  --  212 237 215  --  226   < > = values in this interval not displayed.   Basic Metabolic Panel: Recent Labs  Lab 01/29/21 0254 01/30/21 0251 01/31/21 0503 02/01/21 0521 02/02/21 0633  NA 135 137 138 140 140  K 3.6 4.4 4.0 4.1 4.1  CL 100 104 107 106 107  CO2 27 25 23 26 25   GLUCOSE 96 102* 106* 98 92  BUN 18 13 15 14 13   CREATININE 1.37* 1.13 1.48* 1.11 1.31*  CALCIUM 8.2* 8.4* 8.5* 8.7* 8.7*   GFR: Estimated Creatinine Clearance: 57.3 mL/min (A) (by C-G formula based on SCr of 1.31 mg/dL (H)). Liver Function Tests: Recent Labs  Lab 01/28/21 1035  AST 11*  ALT 10  ALKPHOS 43  BILITOT 0.5  PROT 5.8*  ALBUMIN 2.6*   No results for input(s): LIPASE, AMYLASE in the last 168 hours. No results for input(s): AMMONIA in the last 168 hours. Coagulation Profile: Recent Labs  Lab 01/28/21 1035  INR 1.3*   Cardiac Enzymes: No results for input(s): CKTOTAL, CKMB, CKMBINDEX, TROPONINI in the last 168 hours.  BNP (last 3 results) No results for input(s): PROBNP in the last 8760 hours. HbA1C: No results for input(s): HGBA1C in the last 72 hours. CBG: No results for input(s): GLUCAP in the last 168 hours. Lipid Profile: No results for input(s): CHOL, HDL, LDLCALC, TRIG, CHOLHDL, LDLDIRECT in the last 72 hours. Thyroid Function Tests: No results for input(s): TSH, T4TOTAL, FREET4, T3FREE, THYROIDAB in the last 72 hours. Anemia Panel: Recent Labs    02/01/21 1138  VITAMINB12 84*  FOLATE 9.0  FERRITIN 33  TIBC 246*  IRON 37*  RETICCTPCT 6.4*   Urine analysis:    Component Value Date/Time   COLORURINE YELLOW 01/04/2021 1729   APPEARANCEUR CLEAR 01/04/2021 1729   LABSPEC 1.012  01/04/2021 1729   PHURINE 5.0 01/04/2021 1729   GLUCOSEU NEGATIVE 01/04/2021 1729   HGBUR MODERATE (A) 01/04/2021 1729   BILIRUBINUR NEGATIVE 01/04/2021 1729   KETONESUR NEGATIVE 01/04/2021 1729   PROTEINUR NEGATIVE 01/04/2021 1729   NITRITE NEGATIVE 01/04/2021 1729   LEUKOCYTESUR NEGATIVE 01/04/2021 1729   Recent Results (from the past 240 hour(s))  Resp Panel by RT-PCR (Flu A&B, Covid) Nasopharyngeal Swab     Status: None   Collection Time: 01/28/21 10:27 AM   Specimen: Nasopharyngeal Swab; Nasopharyngeal(NP) swabs in vial transport medium  Result Value Ref Range Status   SARS Coronavirus 2 by RT PCR NEGATIVE NEGATIVE Final    Comment: (NOTE) SARS-CoV-2 target nucleic acids are NOT DETECTED.  The SARS-CoV-2 RNA is generally detectable in upper respiratory specimens during the acute phase of infection. The lowest concentration of SARS-CoV-2 viral copies this assay can detect is 138 copies/mL. A negative result does not preclude SARS-Cov-2 infection and should not be used as the sole basis for treatment or other patient management decisions. A negative result may occur with  improper specimen collection/handling, submission of specimen other than nasopharyngeal swab, presence of viral mutation(s) within the areas targeted by this assay, and inadequate number of viral copies(<138 copies/mL). A negative result must be combined with clinical observations, patient history, and epidemiological information. The expected result is Negative.  Fact Sheet for Patients:  EntrepreneurPulse.com.au  Fact Sheet for Healthcare Providers:  IncredibleEmployment.be  This test is no t yet approved or cleared by the [place] FDA and  has been authorized for detection and/or diagnosis of SARS-CoV-2 by FDA under an Emergency Use Authorization (EUA). This EUA will remain  in effect (meaning this test can be used) for the duration of the COVID-19 declaration  under Section 564(b)(1) of the Act, 21 U.S.C.section 360bbb-3(b)(1), unless the authorization is terminated  or revoked sooner.       Influenza A by PCR NEGATIVE NEGATIVE Final   Influenza B by PCR NEGATIVE NEGATIVE Final    Comment: (NOTE) The Xpert Xpress SARS-CoV-2/FLU/RSV plus assay is intended as an aid in the diagnosis of influenza from Nasopharyngeal swab specimens and should not be used as a sole basis for treatment. Nasal washings and aspirates are unacceptable for Xpert Xpress SARS-CoV-2/FLU/RSV testing.  Fact Sheet for Patients: EntrepreneurPulse.com.au  Fact Sheet for Healthcare Providers: IncredibleEmployment.be  This test is not yet approved or cleared by the [place] FDA and has been authorized for detection and/or diagnosis of SARS-CoV-2 by FDA under an Emergency Use Authorization (EUA). This EUA will remain in effect (meaning this test can be used) for the duration of the COVID-19 declaration under Section 564(b)(1) of the Act, 21 U.S.C. section 360bbb-3(b)(1), unless the authorization is terminated or revoked.  Performed at [facility], [place] [address], [place], [place] [address]   MRSA PCR Screening     Status: None   Collection Time: 01/28/21  3:32 PM   Specimen: Nasopharyngeal  Result Value Ref Range Status   MRSA by PCR NEGATIVE NEGATIVE Final    Comment:        The GeneXpert MRSA Assay (FDA approved for NASAL specimens only), is one component of a comprehensive MRSA colonization surveillance program. It is not intended to diagnose MRSA infection nor to guide or monitor treatment for MRSA infections. Performed at [facility], [place] [address] [place], [place] [address]       Radiology Studies: No results found.  Scheduled Meds: . sodium chloride   Intravenous Once  . Chlorhexidine Gluconate Cloth  6 each Topical Daily  . cyanocobalamin  1,000 mcg Intramuscular  Daily  . levothyroxine  25 mcg Oral Daily  . mouth rinse  15 mL Mouth Rinse BID  . pantoprazole (PROTONIX) IV  40 mg Intravenous Q12H  . sodium chloride flush  3 mL Intravenous Q12H   Continuous Infusions: . sodium chloride 10 mL/hr at 01/30/21 1901  . ferric gluconate (FERRLECIT/NULECIT) IV    . penicillin g continuous IV infusion 12 Million Units (02/01/21 2028)     LOS: 5 days   Time spent: 35 minutes.  Patrecia Pour, MD [facility] [email or web address] 02/02/2021, 9:57 AM

## 2021-02-03 LAB — HEMOGLOBIN AND HEMATOCRIT, BLOOD
HCT: 25.2 % — ABNORMAL LOW (ref 39.0–52.0)
Hemoglobin: 8.2 g/dL — ABNORMAL LOW (ref 13.0–17.0)

## 2021-02-03 LAB — TYPE AND SCREEN
ABO/RH(D): O POS
Antibody Screen: NEGATIVE
Unit division: 0

## 2021-02-03 LAB — BPAM RBC
Blood Product Expiration Date: 202205092359
ISSUE DATE / TIME: 202204171348
Unit Type and Rh: 5100

## 2021-02-03 MED ORDER — VITAMIN B-12 1000 MCG PO TABS: 1000.0000 ug | ORAL_TABLET | Freq: Every day | ORAL | Status: AC

## 2021-02-03 MED ORDER — FERROUS SULFATE 325 (65 FE) MG PO TABS: 325.0000 mg | ORAL_TABLET | Freq: Every day | ORAL | Status: AC

## 2021-02-03 NOTE — Care Management Important Message (Signed)
Important Message  Patient Details IM Letter given to the Patient. Name: Donald Harrington MRN: [id number] Date of Birth: 23-Jun-1956   Medicare Important Message Given:  Yes     Kerin Salen 02/03/2021, 1:14 PM

## 2021-02-03 NOTE — TOC Progression Note (Signed)
Transition of Care [facility]) - Progression Note    Patient Details  Name: Evin Loiseau MRN: [id number] Date of Birth: [birth year]  Transition of Care [facility]) CM/SW [place], [place] Phone Number: 02/03/2021, 10:34 AM  Clinical Narrative:   Checked in with admissions at [facility] after seeing that patient is not being seen by PT here.  She clarified that he is there for 6 weeks of IV abx, which is the skilled need.  She reiterated that they are unable to take him back until insurance approves more days so they can get reimbursed for patient's stay. MD informed, and he asked about sending her back with LOG.  CSW will follow up with leadership with this question. TOC will continue to follow during the course of hospitalization.     Expected Discharge Plan: Gate Barriers to Discharge: Continued Medical Work up  Expected Discharge Plan and Services Expected Discharge Plan: [place]   Discharge Planning Services: CM Consult Post Acute Care Choice: [place] Living arrangements for the past 2 months: Apartment Expected Discharge Date: 02/03/21                                     Social Determinants of Health (SDOH) Interventions    Readmission Risk Interventions No flowsheet data found.

## 2021-02-03 NOTE — Discharge Summary (Signed)
Physician Discharge Summary  [name] VVO:[id number] DOB: 1956-02-05 DOA: 01/28/2021  PCP: Patient, No Pcp Per (Inactive)  Admit date: 01/28/2021 Discharge date: 02/03/2021  Admitted From: [facility] Disposition: [facility]   Recommendations for Outpatient Follow-up:  1. Restart eliquis on 4/18 2. Continue penicillin G through 5/2 3. Recheck CBC within the next week. Also check BMP and CBC weekly as well as every other week ESR and CRP per recommendations in regards to PCN treatment of PV endocarditis. 4. Consider cardiology evaluation for LAA clipping (as he has failed coumadin in the past w/embolic strokes and was bleeding on eliquis).  Home Health: N/A Equipment/Devices: None new, continue RUE PICC Discharge Condition: Stable CODE STATUS: Full Diet recommendation: Heart healthy  Brief/Interim Summary: Donald Harrington is a 65 y.o. 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 In the ED he was afebrile, hypotensive with Hgb 6.1, SCr 1.28, BUN 23, +FOBT, INR 1.3. 1u PRBCs and IVF ordered and pt admitted to SDU with GI recommending full day prep 4/13 with colonoscopy 4/14. Colonoscopy and EGD were performed on 4/14 revealing erythematous duodenopathy and colonic polyps without clear source of bleeding. Eliquis has been held and should be held for 3 days from the time of colonoscopy per GI recommendations. Hemoglobin has stabilized without further transfusions and bleeding has remained resolved throughout admission. He is stable to return to SNF.  Endoscopy 01/05/2021 (Previous admission): Erosive gastropathy with no bleeding and no stigmata of recent bleeding,  nonbleeding duodenal ulcer without stigmata of bleeding likely due to NSAIDs and steroids. Started on PPI twice daily and okay to restart anticoagulation (Coumadin at the time). A colonoscopy was recommended however not done due to poor prep  Colonoscopy 01/30/2021 Dr. Benson Norway Impression:       - Five 3 to 8 mm polyps at the recto-sigmoid colon,                            in the descending colon and in the transverse                            colon, removed with a cold snare. Resected and                            retrieved.  Recommendation: - Return patient to hospital ward for ongoing care.                           - Resume regular diet.                           - Continue present medications.                           - Await pathology results.                           - Repeat colonoscopy in 3  years for surveillance.                           - Resume Eliquis in 3 days.  EGD 01/30/2021 Dr. Benson Norway Impression:       - Normal esophagus.                           - Normal stomach.                           - Erythematous duodenopathy.                           - No specimens collected.  Recommendation: - Resume Eliquis in 3 days.                           - Follow HGB and transfuse as necessary.  Discharge Diagnoses:  Principal Problem:   Acute blood loss anemia Active Problems:   GI bleed   PAF (paroxysmal atrial fibrillation) (HCC)   History of aortic valve replacement   History of mitral valve replacement   Bacteremia due to Streptococcus   Hypotension  Acute blood loss anemia due to recurrent GI bleeding: Possibly related to duodenopathy or polyps which were resected at colonoscopy.  - s/p 1u PRBC 4/12, 1u 4/17 > hgb stable at 8.2g/dl on day of discharge.  - IV iron given 4/17 as posttransfusion specimen still showed low iron. Can continue daily supplement at discharge. - Hold anticoagulation - Continue PPI - Follow up pathology results per GI from colonoscopy - Repeat  colonoscopy recommended in 3 years per GI.  B12 deficiency:  - Supplemented IM while in hospital, can convert to po at discharge  PAF, History of bioprosthetic AVR, MVR:  - Can restart home medications with stabilized vital signs. - Hold eliquis. May consider LAA clipping since failed coumadin (emboli) and now bleeding on eliquis.  Hypotension: Due to anemia on chronically soft baseline. This has stabilized and improved.  Multifocal embolic CVA: Precipitated by subtherapeutic INR. - Restart anticoagulation as soon as possible, 4/17 per GI.  - Monitor neuro exam closely, stable.  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 Diagnosed at his last hospitalization and discharged on 01/13/21 with Gentamicin and Penicillin IV x 2 weeks followed by 4 additional weeks of Penicillin.  - Continue penicillin, dose changed at discretion of SNF MD to 39M units q12h which is equivalent daily dosing. End date remains 02/17/2021. Has completed gentamicin.   Discharge Instructions  Allergies as of 02/03/2021   No Known Allergies     Medication List    STOP taking these medications   gentamicin 200 mg in dextrose 5 % 50 mL   penicillin G  IVPB     TAKE these medications   apixaban 5 MG Tabs tablet Commonly known as: ELIQUIS Take 1 tablet (5 mg total) by mouth 2 (two) times daily. hold until 4/17 What changed: additional instructions   cyclobenzaprine 10 MG tablet Commonly known as: FLEXERIL Take 10 mg by mouth daily as needed for muscle spasms.   Euthyrox 25 MCG tablet Generic drug: levothyroxine Take 25 mcg by mouth daily.   ferrous sulfate 325 (65 FE) MG tablet Take 1 tablet (325 mg total) by mouth daily.  furosemide 20 MG tablet Commonly known as: LASIX Take 40 mg by mouth daily.   lovastatin 20 MG tablet Commonly known as: MEVACOR Take 20 mg by mouth at bedtime.   melatonin 5 MG  Tabs Take 1 tablet (5 mg total) by mouth at bedtime.   metoprolol succinate 50 MG 24 hr tablet Commonly known as: TOPROL-XL Take 50 mg by mouth daily.   pantoprazole 40 MG tablet Commonly known as: PROTONIX Take 1 tablet (40 mg total) by mouth 2 (two) times daily for 28 days.   penicillin G potassium 12 Million Units in dextrose 5 % 500 mL Inject 12 Million Units into the vein every 12 (twelve) hours.   thiamine 250 MG tablet Take 250 mg by mouth daily.   vitamin B-12 1000 MCG tablet Commonly known as: CYANOCOBALAMIN Take 1 tablet (1,000 mcg total) by mouth daily.       Follow-up [place] [place] Follow up.   Specialty: Skilled Nursing Facility Contact information: [place] [address] [phone]        Carol Ada, MD. Schedule an appointment as soon as possible for a visit in 4 week(s).   Specialty: Gastroenterology Contact information: [place], [place] [address] [facility] Follow up.   Specialty: Infectious Diseases Contact information: [place], [place] [id number] [place] [address] [phone]             No Known Allergies  Consultations:  GI  Procedures/Studies: DG Orthopantogram  Result Date: 01/07/2021 CLINICAL DATA:  Endocarditis EXAM: ORTHOPANTOGRAM/PANORAMIC COMPARISON:  None. FINDINGS: The mandible is intact. The maxilla is intact. There is numerous absent dentition involving the maxillary and mandibular molars and the majority of a maxillary and mandibular premolars.z dental caries are identified within tooth 8, the residual fragment of tooth 13, tooth 24, and tooth 25. A lucent lesion is seen within the maxilla within the defect related to the absent tooth 10 demonstrating relatively poorly circumscribed margins and no significant matrix which may represent a residual periapical abscess.  IMPRESSION: Multiple dental caries within the residual dentition as described above. Lucency within the maxilla within the defect related to the absent tooth 10 which may represent a a residual periapical abscess. Electronically Signed   By: [name]   On: 01/07/2021 11:20   MR ANGIO HEAD WO CONTRAST  Result Date: 01/04/2021 CLINICAL DATA:  Multiple embolic strokes. EXAM: MRA HEAD WITHOUT CONTRAST TECHNIQUE: Angiographic images of the Circle of Willis were obtained using MRA technique without intravenous contrast. COMPARISON:  MRI yesterday. FINDINGS: Both internal carotid arteries are patent through the skull base and siphon regions. The right ICA supplies the right middle cerebral artery territory and both anterior cerebral artery territories. No evidence of large vessel occlusion. No correctable proximal stenosis. Left internal carotid artery supplies the left middle cerebral artery territory and the left PCA. No large vessel occlusion. Both vertebral arteries are patent to the basilar. No basilar stenosis. Posterior circulation branch vessels show flow. Left PCA takes fetal origin from the anterior circulation, as noted above. IMPRESSION: No large or medium vessel occlusion or correctable proximal stenosis. Electronically Signed   By: Nelson Chimes M.D.   On: 01/04/2021 16:47   MR CERVICAL SPINE WO CONTRAST  Result Date: 01/10/2021 CLINICAL DATA:  Initial evaluation for acute neck pain, infection suspected. EXAM: MRI CERVICAL SPINE WITHOUT CONTRAST TECHNIQUE: Multiplanar,  multisequence MR imaging of the cervical spine was performed. No intravenous contrast was administered. COMPARISON:  Prior MRI from 12/05/2020. FINDINGS: Alignment: Examination somewhat degraded by motion artifact. Straightening with slight reversal of the normal cervical lordosis. Trace anterolisthesis of C3 on C4, stable. Vertebrae: Vertebral body height maintained without acute or interval fracture. Bone marrow signal  intensity diffusely heterogeneous without worrisome osseous lesion. Discogenic reactive endplate changes present about the C5-6 through C7-T1 interspaces, with partial ankylosis across the C6-7 disc space. Appearance is stable from previous. No signal changes to suggest osteomyelitis discitis or septic arthritis. Cord: Normal signal and morphology.  No epidural collections. Posterior Fossa, vertebral arteries, paraspinal tissues: Visualized brain and posterior fossa within normal limits. Craniocervical junction normal. Paraspinous and prevertebral soft tissues within normal limits. Normal flow voids seen within the vertebral arteries bilaterally. Disc levels: C2-C3: Small central disc protrusion mildly indents the ventral thecal sac, slightly asymmetric to the left. No significant spinal stenosis or cord deformity. Superimposed mild left greater than right facet degeneration without significant foraminal stenosis. Appearance is stable. C3-C4: Trace anterolisthesis. Central disc protrusion indents the ventral thecal sac, contacting and mildly flattening the ventral spinal cord. No cord signal changes or significant spinal stenosis. Superimposed left greater than right uncovertebral and facet hypertrophy with resultant mild left C4 foraminal stenosis. Right neural foramina remains patent. Appearance is stable. C4-C5: Degenerative intervertebral disc space narrowing with diffuse disc osteophyte complex, eccentric to the left. Broad posterior component flattens and partially effaces the ventral thecal sac with resultant mild spinal stenosis. Mild cord flattening without cord signal changes. Superimposed bilateral facet hypertrophy with resultant moderate to severe bilateral C5 foraminal narrowing. Appearance is stable. C5-C6: Degenerative intervertebral disc space narrowing with diffuse disc osteophyte complex, asymmetric to the left. Broad posterior component flattens and partially faces the ventral thecal sac. Mild  flattening of the ventral cord, greater on the left. No cord signal changes. Mild spinal stenosis is stable. Moderate bilateral C6 foraminal narrowing, worse on the right, also stable. C6-C7: Advanced degenerative intervertebral disc space narrowing with partial ankylosis across the C6-7 interspace. Right greater than left uncovertebral spurring. Posterior disc osteophyte flattens and partially effaces the ventral thecal sac without significant stenosis or cord impingement. Superimposed mild facet hypertrophy. Mild bilateral C7 foraminal stenosis is relatively stable. C7-T1: Degenerative intervertebral disc space narrowing with diffuse disc osteophyte complex. Flattening and partial effacement of the ventral thecal sac without significant spinal stenosis or cord deformity. Superimposed bilateral facet hypertrophy. Moderate left with mild right C8 foraminal narrowing, stable. Visualized upper thoracic spine demonstrates no significant finding. IMPRESSION: 1. No MRI evidence for acute infection or other abnormality within the cervical spine. 2. Multilevel cervical spondylosis with resultant mild spinal stenosis at C4-5 and C5-6. 3. Multifactorial degenerative changes with resultant multilevel foraminal narrowing as above. Notable findings include moderate to severe bilateral C5 foraminal narrowing, with moderate bilateral C6 and left C8 foraminal stenosis. Electronically Signed   By: Jeannine Boga M.D.   On: 01/10/2021 23:15   DG Chest Port 1 View  Result Date: 01/28/2021 CLINICAL DATA:  Weakness EXAM: PORTABLE CHEST 1 VIEW COMPARISON:  01/05/2021 FINDINGS: New right-sided PICC line is noted with the catheter tip in the mid superior vena cava. Cardiac shadow is stable. Changes of prior heart surgery are noted. Lungs are clear bilaterally. No bony abnormality is seen. IMPRESSION: No active disease. Electronically Signed   By: Inez Catalina M.D.   On: 01/28/2021 10:48   DG CHEST PORT 1 VIEW  Result  Date:  01/05/2021 CLINICAL DATA:  Shortness of breath EXAM: PORTABLE CHEST 1 VIEW COMPARISON:  01/03/2021 FINDINGS: Prior median sternotomy and valve replacement. Heart is normal size. Hyperinflation/COPD. Bibasilar opacities. No effusions. No acute bony abnormality. IMPRESSION: COPD. Bibasilar atelectasis or infiltrates, new since prior study. Electronically Signed   By: Rolm Baptise M.D.   On: 01/05/2021 15:35   ECHOCARDIOGRAM COMPLETE  Result Date: 01/06/2021    ECHOCARDIOGRAM REPORT   Patient Name:   SZYMON Licciardi Date of Exam: 01/05/2021 Medical Rec #:  [id number]    Height:       70.5 in Accession #:    [id number]   Weight:       165.0 lb Date of Birth:  January 29, 1956    BSA:          1.934 m Patient Age:    20 years     BP:           109/68 mmHg Patient Gender: M            HR:           110 bpm. Exam Location:  Inpatient Procedure: 2D Echo Indications:    Stroke  History:        Patient has no prior history of Echocardiogram examinations.                 Aortic and mitral replacement in 2015 at [facility] due                 to endocarditis; Aortic Valve Disease, Mitral Valve Disease and                 Endocarditis.  Sonographer:    Merrie Roof RDCS Referring Phys: [place]  1. Poor acoustic windows. Cannot exclude vegetations. Would recomm TEE to further evaluate if clinically indicated.  2. Left ventricular ejection fraction, by estimation, is 55 to 60%. The left ventricle has normal function. The left ventricle has no regional wall motion abnormalities. Indeterminate diastolic filling due to E-A fusion.  3. Right ventricular systolic function is normal. The right ventricular size is normal.  4. Left atrial size was mild to moderately dilated.  5. MV prosthesis (Epic bioprosthesis, 05/01/14) Not well visualized. Peak and mean gradients through the valveare 19 and 9 mm Hx respectively. Compared to echo report from 06/05/19 [facility]) mild increase in mean gradient (6 to 9 mm). The mitral  valve has been repaired/replaced. Trivial mitral valve regurgitation.  6. AV prosthesis (23 mm Hancock bioprosthesis, 05/01/14) present Not well visualized. Peak and mean gradients through the valve are 51 and 30 mm Hg respectively Comparee to reprot for 8/117/20 [facility]), mean graidnet is increased. . The aortic valve has been repaired/replaced. Aortic valve regurgitation is trivial.  7. The inferior vena cava is normal in size with greater than 50% respiratory variability, suggesting right atrial pressure of 3 mmHg. FINDINGS  Left Ventricle: Left ventricular ejection fraction, by estimation, is 55 to 60%. The left ventricle has normal function. The left ventricle has no regional wall motion abnormalities. The left ventricular internal cavity size was normal in size. There is  no left ventricular hypertrophy. Indeterminate diastolic filling due to E-A fusion. Right Ventricle: The right ventricular size is normal. Right vetricular wall thickness was not assessed. Right ventricular systolic function is normal. Left Atrium: Left atrial size was mild to moderately dilated. Right Atrium: Right atrial size was normal in size. Pericardium: There is no evidence of  pericardial effusion. Mitral Valve: MV prosthesis (Epic bioprosthesis, 05/01/14) Not well visualized peak and mean gradients through the valveare 19 and 9 mm Hx respectively. Compared to echo reprot for 06/05/19 [address] [facility]) mild increase in mean gradient (6 to 9 mm). The mitral valve has been repaired/replaced. Trivial mitral valve regurgitation. MV peak gradient, 18.5 mmHg. The mean mitral valve gradient is 9.0 mmHg. Tricuspid Valve: The tricuspid valve is grossly normal. Tricuspid valve regurgitation is mild. Aortic Valve: AV prosthesis (23 mm Hancock bioprosthesis, 05/01/14) present Not well visualized. Peak and mean gradients through the valve are 51 and 30 mm Hg respectively Comparee to reprot for 8/117/20 [facility]), mean graidnet is increased. The aortic valve has been  repaired/replaced. Aortic valve regurgitation is trivial. Aortic valve mean gradient measures 30.0 mmHg. Aortic valve peak gradient measures 50.7 mmHg. Aortic valve area, by VTI measures 0.43 cm. Pulmonic Valve: The pulmonic valve was not well visualized. Pulmonic valve regurgitation is not visualized. Aorta: The aortic root is normal in size and structure. Venous: The inferior vena cava is normal in size with greater than 50% respiratory variability, suggesting right atrial pressure of 3 mmHg. IAS/Shunts: The interatrial septum was not assessed.  LEFT VENTRICLE PLAX 2D LVIDd:         5.10 cm  Diastology LVIDs:         3.70 cm  LV e' lateral: 10.30 cm/s LV PW:         0.80 cm LV IVS:        1.00 cm LVOT diam:     2.00 cm LV SV:         33 LV SV Index:   17 LVOT Area:     3.14 cm  RIGHT VENTRICLE          IVC RV Basal diam:  3.80 cm  IVC diam: 1.90 cm LEFT ATRIUM             Index       RIGHT ATRIUM           Index LA diam:        5.20 cm 2.69 cm/m  RA Area:     17.40 cm LA Vol (A2C):   64.0 ml 33.10 ml/m RA Volume:   45.50 ml  23.53 ml/m LA Vol (A4C):   95.3 ml 49.29 ml/m LA Biplane Vol: 82.6 ml 42.72 ml/m  AORTIC VALVE AV Area (Vmax):    0.67 cm AV Area (Vmean):   0.50 cm AV Area (VTI):     0.43 cm AV Vmax:           356.00 cm/s AV Vmean:          262.000 cm/s AV VTI:            0.752 m AV Peak Grad:      50.7 mmHg AV Mean Grad:      30.0 mmHg LVOT Vmax:         75.50 cm/s LVOT Vmean:        42.000 cm/s LVOT VTI:          0.104 m LVOT/AV VTI ratio: 0.14  AORTA Ao Root diam: 3.10 cm MITRAL VALVE             TRICUSPID VALVE MV Area VTI:  0.60 cm   TR Peak grad:   30.9 mmHg MV Peak grad: 18.5 mmHg  TR Vmax:        278.00 cm/s MV Mean grad: 9.0 mmHg MV Vmax:  2.15 m/s   SHUNTS MV Vmean:     139.0 cm/s Systemic VTI:  0.10 m                          Systemic Diam: 2.00 cm Dorris Carnes MD Electronically signed by Dorris Carnes MD Signature Date/Time: 01/06/2021/4:04:54 PM    Final    ECHO TEE  Result Date:  01/08/2021    TRANSESOPHOGEAL ECHO REPORT   Patient Name:   PASQUALE Wegmann Date of Exam: 01/08/2021 Medical Rec #:  [id number]    Height:       70.5 in Accession #:    [id number]   Weight:       175.9 lb Date of Birth:  July 19, 1956    BSA:          1.987 m Patient Age:    65 years     BP:           86/59 mmHg Patient Gender: M            HR:           91 bpm. Exam Location:  Inpatient Procedure: Transesophageal Echo, Cardiac Doppler and Color Doppler Indications:     Bacteremia  History:         Patient has prior history of Echocardiogram examinations, most                  recent 01/06/2021. Previous Myocardial Infarction, Stroke,                  Endocarditis and AVR. MVR, Arrythmias:Atrial Fibrillation,                  Signs/Symptoms:Bacteremia; Risk Factors:Hypertension.  Sonographer:     Dustin Flock Referring Phys:  [id number] Tami Lin [facility] Diagnosing Phys: Mertie Moores MD PROCEDURE: The transesophogeal probe was passed without difficulty through the esophogus of the patient. Sedation performed by performing physician. The patient was monitored while under deep sedation. Anesthestetic sedation was provided intravenously by  Anesthesiology: 233.2m of Propofol, 830mof Lidocaine. The patient developed no complications during the procedure. IMPRESSIONS  1. Left ventricular ejection fraction, by estimation, is 55 to 60%. The left ventricle has normal function.  2. Right ventricular systolic function is normal. The right ventricular size is normal.  3. No left atrial/left atrial appendage thrombus was detected.  4. The mitral valve has been repaired/replaced. No evidence of mitral valve regurgitation. No evidence of mitral stenosis.  5. The tricuspid valve is abnormal. Tricuspid valve regurgitation is moderate.  6. The aortic valve has been repaired/replaced. Aortic valve regurgitation is not visualized. No aortic stenosis is present. FINDINGS  Left Ventricle: Left ventricular ejection fraction, by estimation,  is 55 to 60%. The left ventricle has normal function. The left ventricular internal cavity size was normal in size. Right Ventricle: The right ventricular size is normal. No increase in right ventricular wall thickness. Right ventricular systolic function is normal. Left Atrium: Left atrial size was normal in size. No left atrial/left atrial appendage thrombus was detected. Right Atrium: Right atrial size was normal in size. Pericardium: There is no evidence of pericardial effusion. Mitral Valve: The mitral valve has been repaired/replaced. No evidence of mitral valve regurgitation. There is a bioprosthetic valve present in the mitral position. No evidence of mitral valve stenosis. There is no evidence of mitral valve vegetation. Tricuspid Valve: The tricuspid valve is abnormal. Tricuspid valve regurgitation is moderate. Aortic Valve: The  aortic valve has been repaired/replaced. Aortic valve regurgitation is not visualized. No aortic stenosis is present. There is a bioprosthetic valve present in the aortic position. There is no evidence of aortic valve vegetation. Pulmonic Valve: The pulmonic valve was grossly normal. Pulmonic valve regurgitation is trivial. Aorta: The aortic root and ascending aorta are structurally normal, with no evidence of dilitation. There is minimal (Grade I) plaque. IAS/Shunts: No atrial level shunt detected by color flow Doppler. Mertie Moores MD Electronically signed by Mertie Moores MD Signature Date/Time: 01/08/2021/3:13:00 PM    Final    VAS US CAROTID  Result Date: 01/07/2021 Carotid Arterial Duplex Study Indications:       CVA. Risk Factors:      Hypertension, coronary artery disease, prior CVA. 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                    MVR and AVR 2015. PAF, on Coumadin. Comparison Study:  No prior study Performing Technologist: Sharion Dove RVS  Examination Guidelines: A complete evaluation includes B-mode imaging,  spectral Doppler, color Doppler, and power Doppler as needed of all accessible portions of each vessel. Bilateral testing is considered an integral part of a complete examination. Limited examinations for reoccurring indications may be performed as noted.  Right Carotid Findings: +----------+--------+--------+--------+------------------+------------------+           PSV cm/sEDV cm/sStenosisPlaque DescriptionComments           +----------+--------+--------+--------+------------------+------------------+ CCA Prox  79      32                                intimal thickening +----------+--------+--------+--------+------------------+------------------+ CCA Distal82      30                                intimal thickening +----------+--------+--------+--------+------------------+------------------+ ICA Prox  128     49      40-59%  calcific          Shadowing          +----------+--------+--------+--------+------------------+------------------+ ICA Distal101     38                                                   +----------+--------+--------+--------+------------------+------------------+ ECA       134     20                                                   +----------+--------+--------+--------+------------------+------------------+ +----------+--------+-------+--------+-------------------+           PSV cm/sEDV cmsDescribeArm Pressure (mmHG) +----------+--------+-------+--------+-------------------+ Subclavian162                                        +----------+--------+-------+--------+-------------------+ +---------+--------+--+--------+--+ VertebralPSV cm/s69EDV cm/s21 +---------+--------+--+--------+--+  Left Carotid Findings: +----------+--------+--------+--------+------------------+---------+           PSV cm/sEDV cm/sStenosisPlaque DescriptionComments  +----------+--------+--------+--------+------------------+---------+ CCA Prox  140      48  heterogenous                +----------+--------+--------+--------+------------------+---------+ CCA Distal131     32              heterogenous                +----------+--------+--------+--------+------------------+---------+ ICA Prox  140     48      40-59%  calcific          Shadowing +----------+--------+--------+--------+------------------+---------+ ICA Mid   108     48                                          +----------+--------+--------+--------+------------------+---------+ ICA Distal101     38                                          +----------+--------+--------+--------+------------------+---------+ ECA       135     37                                          +----------+--------+--------+--------+------------------+---------+ +----------+--------+--------+--------+-------------------+           PSV cm/sEDV cm/sDescribeArm Pressure (mmHG) +----------+--------+--------+--------+-------------------+ [id number]                                          +----------+--------+--------+--------+-------------------+ +---------+--------+--+--------+--+ VertebralPSV cm/s58EDV cm/s22 +---------+--------+--+--------+--+   Summary: Right Carotid: Velocities in the right ICA are consistent with a 40-59%                stenosis. Left Carotid: Velocities in the left ICA are consistent with a 40-59% stenosis. Vertebrals:  Bilateral vertebral arteries demonstrate antegrade flow. Subclavians: Normal flow hemodynamics were seen in bilateral subclavian              arteries. *See table(s) above for measurements and observations.  Electronically signed by Antony Contras MD on 01/07/2021 at 9:02:50 AM.    Final    [place] EKG SITE RITE  Result Date: 01/20/2021 If Site Rite image not attached, placement could not be confirmed due to current cardiac rhythm.  [place] EKG SITE RITE  Result Date: 01/10/2021 If [facility] image not attached, placement could not  be confirmed due to current cardiac rhythm.   Colonoscopy 01/30/2021 Dr. Benson Norway Impression:       - Five 3 to 8 mm polyps at the recto-sigmoid colon,                            in the descending colon and in the transverse                            colon, removed with a cold snare. Resected and                            retrieved.  Recommendation: - Return patient to hospital ward for ongoing care.                           -  Resume regular diet.                           - Continue present medications.                           - Await pathology results.                           - Repeat colonoscopy in 3 years for surveillance.                           - Resume Eliquis in 3 days.  EGD 01/30/2021 Dr. Benson Norway Impression:       - Normal esophagus.                           - Normal stomach.                           - Erythematous duodenopathy.                           - No specimens collected.  Recommendation: - Resume Eliquis in 3 days.                           - Follow HGB and transfuse as necessary.  Subjective: Feels well, no bleeding or abd pain, eating well. No fever.  Discharge Exam: Vitals:   02/02/21 2101 02/03/21 0502  BP: 112/75 123/75  Pulse: 90 72  Resp: 20 20  Temp: 98.9 F (37.2 C) 97.9 F (36.6 C)  SpO2: 99% 96%   General: No distredss Cardiovascular: RRR without gallop or JVD or edema Respiratory: Nonlabored and clear Abdominal: Soft, NT, ND  Labs: BNP (last 3 results) Recent Labs    01/04/21 0322  BNP 580.9*   Basic Metabolic Panel: Recent Labs  Lab 01/29/21 0254 01/30/21 0251 01/31/21 0503 02/01/21 0521 02/02/21 0633  NA 135 137 138 140 140  K 3.6 4.4 4.0 4.1 4.1  CL 100 104 107 106 107  CO2 '27 25 23 26 25  ' GLUCOSE 96 102* 106* 98 92  BUN '18 13 15 14 13  ' CREATININE 1.37* 1.13 1.48* 1.11 1.31*  CALCIUM 8.2* 8.4* 8.5* 8.7* 8.7*   Liver Function Tests: Recent Labs  Lab 01/28/21 1035  AST 11*  ALT 10  ALKPHOS 43  BILITOT 0.5   PROT 5.8*  ALBUMIN 2.6*   No results for input(s): LIPASE, AMYLASE in the last 168 hours. No results for input(s): AMMONIA in the last 168 hours. CBC: Recent Labs  Lab 01/29/21 0254 01/29/21 1428 01/30/21 0251 01/31/21 0503 02/01/21 0521 02/01/21 1138 02/02/21 0633 02/02/21 1820 02/03/21 0415  WBC 5.3  --  6.1 5.5 5.0  --  4.7  --   --   HGB 7.3*   < > 7.4* 7.4* 7.0* 7.5* 6.9* 8.6* 8.2*  HCT 21.8*   < > 22.2* 23.2* 21.8* 23.5* 22.0* 26.5* 25.2*  MCV 102.8*  --  104.2* 105.9* 107.4*  --  108.4*  --   --   PLT 242  --  212 237 215  --  226  --   --    < > =  values in this interval not displayed.   Cardiac Enzymes: No results for input(s): CKTOTAL, CKMB, CKMBINDEX, TROPONINI in the last 168 hours. BNP: Invalid input(s): POCBNP CBG: No results for input(s): GLUCAP in the last 168 hours. D-Dimer No results for input(s): DDIMER in the last 72 hours. Hgb A1c No results for input(s): HGBA1C in the last 72 hours. Lipid Profile No results for input(s): CHOL, HDL, LDLCALC, TRIG, CHOLHDL, LDLDIRECT in the last 72 hours. Thyroid function studies No results for input(s): TSH, T4TOTAL, T3FREE, THYROIDAB in the last 72 hours.  Invalid input(s): FREET3 Anemia work up Recent Labs    02/01/21 1138  VITAMINB12 84*  FOLATE 9.0  FERRITIN 33  TIBC 246*  IRON 37*  RETICCTPCT 6.4*   Urinalysis    Component Value Date/Time   COLORURINE YELLOW 01/04/2021 1729   APPEARANCEUR CLEAR 01/04/2021 1729   LABSPEC 1.012 01/04/2021 1729   PHURINE 5.0 01/04/2021 1729   GLUCOSEU NEGATIVE 01/04/2021 1729   HGBUR MODERATE (A) 01/04/2021 1729   BILIRUBINUR NEGATIVE 01/04/2021 1729   KETONESUR NEGATIVE 01/04/2021 1729   PROTEINUR NEGATIVE 01/04/2021 1729   NITRITE NEGATIVE 01/04/2021 1729   LEUKOCYTESUR NEGATIVE 01/04/2021 1729    Microbiology Recent Results (from the past 240 hour(s))  Resp Panel by RT-PCR (Flu A&B, Covid) Nasopharyngeal Swab     Status: None   Collection Time: 01/28/21  10:27 AM   Specimen: Nasopharyngeal Swab; Nasopharyngeal(NP) swabs in vial transport medium  Result Value Ref Range Status   SARS Coronavirus 2 by RT PCR NEGATIVE NEGATIVE Final    Comment: (NOTE) SARS-CoV-2 target nucleic acids are NOT DETECTED.  The SARS-CoV-2 RNA is generally detectable in upper respiratory specimens during the acute phase of infection. The lowest concentration of SARS-CoV-2 viral copies this assay can detect is 138 copies/mL. A negative result does not preclude SARS-Cov-2 infection and should not be used as the sole basis for treatment or other patient management decisions. A negative result may occur with  improper specimen collection/handling, submission of specimen other than nasopharyngeal swab, presence of viral mutation(s) within the areas targeted by this assay, and inadequate number of viral copies(<138 copies/mL). A negative result must be combined with clinical observations, patient history, and epidemiological information. The expected result is Negative.  Fact Sheet for Patients:  EntrepreneurPulse.com.au  Fact Sheet for Healthcare Providers:  IncredibleEmployment.be  This test is no t yet approved or cleared by the [place] FDA and  has been authorized for detection and/or diagnosis of SARS-CoV-2 by FDA under an Emergency Use Authorization (EUA). This EUA will remain  in effect (meaning this test can be used) for the duration of the COVID-19 declaration under Section 564(b)(1) of the Act, 21 U.S.C.section 360bbb-3(b)(1), unless the authorization is terminated  or revoked sooner.       Influenza A by PCR NEGATIVE NEGATIVE Final   Influenza B by PCR NEGATIVE NEGATIVE Final    Comment: (NOTE) The Xpert Xpress SARS-CoV-2/FLU/RSV plus assay is intended as an aid in the diagnosis of influenza from Nasopharyngeal swab specimens and should not be used as a sole basis for treatment. Nasal washings and aspirates  are unacceptable for Xpert Xpress SARS-CoV-2/FLU/RSV testing.  Fact Sheet for Patients: EntrepreneurPulse.com.au  Fact Sheet for Healthcare Providers: IncredibleEmployment.be  This test is not yet approved or cleared by the [place] FDA and has been authorized for detection and/or diagnosis of SARS-CoV-2 by FDA under an Emergency Use Authorization (EUA). This EUA will remain in effect (meaning this test can be used)  for the duration of the COVID-19 declaration under Section 564(b)(1) of the Act, 21 U.S.C. section 360bbb-3(b)(1), unless the authorization is terminated or revoked.  Performed at [facility], [place] [address] [place], [place] [address]   MRSA PCR Screening     Status: None   Collection Time: 01/28/21  3:32 PM   Specimen: Nasopharyngeal  Result Value Ref Range Status   MRSA by PCR NEGATIVE NEGATIVE Final    Comment:        The GeneXpert MRSA Assay (FDA approved for NASAL specimens only), is one component of a comprehensive MRSA colonization surveillance program. It is not intended to diagnose MRSA infection nor to guide or monitor treatment for MRSA infections. Performed at [facility], [place] [address] [place], [place] [address]     Time coordinating discharge: Approximately 40 minutes  Patrecia Pour, MD  [facility] 02/03/2021, 10:05 AM

## 2021-02-04 MED ORDER — APIXABAN 5 MG PO TABS
5.0000 mg | ORAL_TABLET | Freq: Two times a day (BID) | ORAL | Status: DC
  Administered 2021-02-04 – 2021-02-06 (×5): 5 mg via ORAL
  Filled 2021-02-04 (×5): qty 1

## 2021-02-04 MED ORDER — PANTOPRAZOLE SODIUM 40 MG PO TBEC
40.0000 mg | DELAYED_RELEASE_TABLET | Freq: Two times a day (BID) | ORAL | Status: DC
  Administered 2021-02-05 – 2021-02-06 (×2): 40 mg via ORAL
  Filled 2021-02-04 (×3): qty 1

## 2021-02-04 NOTE — Plan of Care (Signed)

## 2021-02-04 NOTE — Progress Notes (Signed)
Patient refused Protonix.

## 2021-02-04 NOTE — Progress Notes (Signed)
PROGRESS NOTE  Brief Narrative: Donald Harrington is a 65 y.o. 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 Work up here has been reassuring and blood counts have stabilized after 2u PRBCs and while holding eliquis. He is discharged to [facility] with insurance authorization pending.   Subjective: Met patient while eating breakfast. He's grumpy that he owes $4,500 for his healthcare (multiple hospitalizations, SNF stay, 6 weeks IV antibiotics). He's willing to complete IV antibiotics at SNF through 02/17/2021 however, prior to returning home. Denies bleeding. Had BM without bleeding last night. Eating well.   Objective: BP 119/74 (BP Location: Left Arm)   Pulse 80   Temp 99.1 F (37.3 C)   Resp 19   Ht '5\' 10"'  (1.778 m)   Wt 73.3 kg   SpO2 95%   BMI 23.19 kg/m   Gen: Chronically ill-appearing male in no acute distress eating breakfast.   Assessment & Plan: ABLA due to recurrent GI bleeding in setting of anticoagulation: s/p 2u PRBCs this admission.  - Restart eliquis today per GI recommendations - Follow up CBC at SNF - Pt remains stable for discharge pending insurance authorization. Please see DC summary dated 02/03/2021 for full details.   Patrecia Pour, MD Pager on amion 02/04/2021, 9:23 AM

## 2021-02-05 DIAGNOSIS — D649 Anemia, unspecified: Secondary | ICD-10-CM

## 2021-02-05 DIAGNOSIS — D62 Acute posthemorrhagic anemia: Secondary | ICD-10-CM

## 2021-02-05 DIAGNOSIS — B955 Unspecified streptococcus as the cause of diseases classified elsewhere: Secondary | ICD-10-CM

## 2021-02-05 DIAGNOSIS — K922 Gastrointestinal hemorrhage, unspecified: Secondary | ICD-10-CM

## 2021-02-05 DIAGNOSIS — R7881 Bacteremia: Secondary | ICD-10-CM

## 2021-02-05 LAB — RESP PANEL BY RT-PCR (FLU A&B, COVID) ARPGX2
Influenza A by PCR: NEGATIVE
Influenza B by PCR: NEGATIVE
SARS Coronavirus 2 by RT PCR: NEGATIVE

## 2021-02-05 NOTE — Progress Notes (Signed)
Patient will be leaving via [facility] to [facility] pending results of labs. Belongings were returned. Education on medications will be provided.

## 2021-02-05 NOTE — Discharge Summary (Addendum)
Physician Discharge Summary  Donald Harrington BDZ:[id number] DOB: 1956-08-24 DOA: 01/28/2021  PCP: Patient, No Pcp Per (Inactive)  Admit date: 01/28/2021 Discharge date: 02/06/2021  Admitted From: [facility]  Disposition: [facility]   Recommendations for Outpatient Follow-up:  1. Restarted eliquis on 4/18,watch closely for bleeding and hemoglobin. 2. Continue penicillin G through 5/2 3. Recheck CBC within the next week. Also check BMP and CBC weekly as well as every other week ESR and CRP per recommendations in regards to PCN treatment of PV endocarditis. 4. Consider cardiology evaluation for LAA clipping (as he has failed coumadin in the past w/embolic strokes and was bleeding on eliquis).   Equipment/Devices: continue RUE PICC  Discharge Condition: Stable  CODE STATUS: Full  Diet recommendation: Heart healthy  Brief/Interim Summary: Donald Harrington a65 y.o.malewith past medical history of endocarditis, prosthetic aortic valve replacement, mitral valve replacement, paroxysmal atrial fibrillation on Eliquis, embolic stroke,  PUD and recent admissions for GI bleeding return to hospital from skilled nursing facility with complaints of GI bleed.  Patient was getting IV antibiotics at the skilled nursing facility.  During hospitalization, GI was consulted and work-up was reassuring.  He received 2 units of packed RBC and his hemoglobin remained stable.  He was started back on Eliquis and has been considered stable for disposition back to [facility], insurance authorization is still pending at this time.  Endoscopy 01/05/2021 (Previous admission): Erosive gastropathy with no bleeding and no stigmata of recent bleeding, nonbleeding duodenal ulcer without stigmata of bleeding likely due to NSAIDs and steroids. Started on PPI twice daily and okay to restart anticoagulation (Coumadin at the time). A colonoscopy was recommended however not done due to poor prep  Colonoscopy 01/30/2021 Dr.  Benson Norway Impression:       - Five 3 to 8 mm polyps at the recto-sigmoid colon,                            in the descending colon and in the transverse                            colon, removed with a cold snare. Resected and                            retrieved.  Recommendation: - Return patient to hospital ward for ongoing care.                           - Resume regular diet.                           - Continue present medications.                           - Await pathology results.                           - Repeat colonoscopy in 3 years for surveillance.                           - Resume Eliquis in 3 days.  EGD 01/30/2021 Dr. Benson Norway Impression:       - Normal esophagus.                           -  Normal stomach.                           - Erythematous duodenopathy.                           - No specimens collected.  Recommendation: - Resume Eliquis in 3 days.                           - Follow HGB and transfuse as necessary.  Discharge Diagnoses:  Principal Problem:   Acute blood loss anemia Active Problems:   GI bleed   PAF (paroxysmal atrial fibrillation) (HCC)   History of aortic valve replacement   History of mitral valve replacement   Bacteremia due to Streptococcus   Hypotension  Acute blood loss anemia due to recurrent GI bleeding:  Patient underwent endoscopy and colonoscopy by GI.  GI bleed was thought to be secondary duodenopathy or polyps which were resected at colonoscopy.  Patient received total of 2 units of packed RBC during hospitalization including IV iron.  Initially Eliquis was on hold which has been resumed at this time after GI recommendation.  Continue PPI.  Repeat colonoscopy recommended in 3 years  B12 deficiency:  - Supplemented IM while in hospital, can convert to po at discharge  PAF, History of bioprosthetic AVR, MVR:  Eliquis has been restarted  as per GI recommendation.  We will need to closely monitor CBC.  Hypotension:  Resolved at  this time  Multifocal embolic CVA:  Secondary to subtherapeutic anticoagulation in the past.  Eliquis has been reinitiated.  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 Diagnosed at his last hospitalization and discharged on 01/13/21 with Gentamicin and Penicillin IV x 2 weeks followed by 4 additional weeks of Penicillin.  Continue penicillin to continue at the skilled nursing facility. End date remains 02/17/2021. Has completed gentamicin. Sepsis ruled out.    Discharge Instructions   Allergies as of 02/05/2021   No Known Allergies     Medication List    STOP taking these medications   gentamicin 200 mg in dextrose 5 % 50 mL   penicillin G  IVPB     TAKE these medications   apixaban 5 MG Tabs tablet Commonly known as: ELIQUIS Take 1 tablet (5 mg total) by mouth 2 (two) times daily. hold until 4/17 What changed: additional instructions   cyclobenzaprine 10 MG tablet Commonly known as: FLEXERIL Take 10 mg by mouth daily as needed for muscle spasms.   Euthyrox 25 MCG tablet Generic drug: levothyroxine Take 25 mcg by mouth daily.   ferrous sulfate 325 (65 FE) MG tablet Take 1 tablet (325 mg total) by mouth daily.   furosemide 20 MG tablet Commonly known as: LASIX Take 40 mg by mouth daily.   lovastatin 20 MG tablet Commonly known as: MEVACOR Take 20 mg by mouth at bedtime.   melatonin 5 MG Tabs Take 1 tablet (5 mg total) by mouth at bedtime.   metoprolol succinate 50 MG 24 hr tablet Commonly known as: TOPROL-XL Take 50 mg by mouth daily.   pantoprazole 40 MG tablet Commonly known as: PROTONIX Take 1 tablet (40 mg total) by mouth 2 (two) times daily for 28 days.   penicillin G potassium 12 Million Units in dextrose 5 % 500 mL Inject 12 Million Units  into the vein every 12 (twelve) hours.   thiamine 250 MG tablet Take 250 mg by mouth daily.   vitamin B-12 1000 MCG  tablet Commonly known as: CYANOCOBALAMIN Take 1 tablet (1,000 mcg total) by mouth daily.       Contact information for follow-up providers    [place], [place] Follow up.   Specialty: Skilled Nursing Facility Contact information: [place] [address] [phone]        Carol Ada, MD. Schedule an appointment as soon as possible for a visit in 4 week(s).   Specialty: Gastroenterology Contact information: [place], [place] [address] [place] for Infectious Disease Follow up.   Specialty: Infectious Diseases Contact information: [place], [place] [id number] [place] [phone]           Contact information for after-discharge care    [facility] .   Service: Skilled Nursing Contact information: [place] [phone]                 No Known Allergies  Consultations:  GI  Procedures/Studies: DG Orthopantogram  Result Date: 01/07/2021 CLINICAL DATA:  Endocarditis EXAM: ORTHOPANTOGRAM/PANORAMIC COMPARISON:  None. FINDINGS: The mandible is intact. The maxilla is intact. There is numerous absent dentition involving the maxillary and mandibular molars and the majority of a maxillary and mandibular premolars.z dental caries are identified within tooth 8, the residual fragment of tooth 13, tooth 24, and tooth 25. A lucent lesion is seen within the maxilla within the defect related to the absent tooth 10 demonstrating relatively poorly circumscribed margins and no significant matrix which may represent a residual periapical abscess. IMPRESSION: Multiple dental caries within the residual dentition as described above. Lucency within the maxilla within the defect related to the absent tooth 10 which may represent a a residual periapical abscess. Electronically Signed   By: [name]   On: 01/07/2021 11:20   MR CERVICAL SPINE WO CONTRAST  Result Date: 01/10/2021 CLINICAL DATA:  Initial evaluation for acute neck pain, infection suspected. EXAM: MRI CERVICAL SPINE WITHOUT CONTRAST TECHNIQUE: Multiplanar, multisequence MR imaging of the cervical spine was performed. No intravenous contrast was administered. COMPARISON:  Prior MRI from 12/05/2020. FINDINGS: Alignment: Examination somewhat degraded by motion artifact. Straightening with slight reversal of the normal cervical lordosis. Trace anterolisthesis of C3 on C4, stable. Vertebrae: Vertebral body height maintained without acute or interval fracture. Bone marrow signal intensity diffusely heterogeneous without worrisome osseous lesion. Discogenic reactive endplate changes present about the C5-6 through C7-T1 interspaces, with partial ankylosis across the C6-7 disc space. Appearance is stable from previous. No signal changes to suggest osteomyelitis discitis or septic arthritis. Cord: Normal signal and morphology.  No epidural collections. Posterior Fossa, vertebral arteries, paraspinal tissues: Visualized brain and posterior fossa within normal limits. Craniocervical junction normal. Paraspinous and prevertebral soft tissues within normal limits. Normal flow voids seen within the vertebral arteries bilaterally. Disc levels: C2-C3: Small central disc protrusion mildly indents the ventral thecal sac, slightly asymmetric to the left. No significant spinal stenosis or cord deformity. Superimposed mild left greater than right facet degeneration without significant foraminal stenosis. Appearance is stable. C3-C4: Trace anterolisthesis. Central disc protrusion indents the ventral thecal sac, contacting and mildly flattening the ventral spinal cord. No cord signal changes or significant spinal stenosis. Superimposed left greater than right uncovertebral and  facet hypertrophy with resultant mild left C4 foraminal stenosis. Right neural  foramina remains patent. Appearance is stable. C4-C5: Degenerative intervertebral disc space narrowing with diffuse disc osteophyte complex, eccentric to the left. Broad posterior component flattens and partially effaces the ventral thecal sac with resultant mild spinal stenosis. Mild cord flattening without cord signal changes. Superimposed bilateral facet hypertrophy with resultant moderate to severe bilateral C5 foraminal narrowing. Appearance is stable. C5-C6: Degenerative intervertebral disc space narrowing with diffuse disc osteophyte complex, asymmetric to the left. Broad posterior component flattens and partially faces the ventral thecal sac. Mild flattening of the ventral cord, greater on the left. No cord signal changes. Mild spinal stenosis is stable. Moderate bilateral C6 foraminal narrowing, worse on the right, also stable. C6-C7: Advanced degenerative intervertebral disc space narrowing with partial ankylosis across the C6-7 interspace. Right greater than left uncovertebral spurring. Posterior disc osteophyte flattens and partially effaces the ventral thecal sac without significant stenosis or cord impingement. Superimposed mild facet hypertrophy. Mild bilateral C7 foraminal stenosis is relatively stable. C7-T1: Degenerative intervertebral disc space narrowing with diffuse disc osteophyte complex. Flattening and partial effacement of the ventral thecal sac without significant spinal stenosis or cord deformity. Superimposed bilateral facet hypertrophy. Moderate left with mild right C8 foraminal narrowing, stable. Visualized upper thoracic spine demonstrates no significant finding. IMPRESSION: 1. No MRI evidence for acute infection or other abnormality within the cervical spine. 2. Multilevel cervical spondylosis with resultant mild spinal stenosis at C4-5 and C5-6. 3. Multifactorial degenerative changes with resultant multilevel foraminal narrowing as above. Notable findings include moderate to severe  bilateral C5 foraminal narrowing, with moderate bilateral C6 and left C8 foraminal stenosis. Electronically Signed   By: Jeannine Boga M.D.   On: 01/10/2021 23:15   DG Chest Port 1 View  Result Date: 01/28/2021 CLINICAL DATA:  Weakness EXAM: PORTABLE CHEST 1 VIEW COMPARISON:  01/05/2021 FINDINGS: New right-sided PICC line is noted with the catheter tip in the mid superior vena cava. Cardiac shadow is stable. Changes of prior heart surgery are noted. Lungs are clear bilaterally. No bony abnormality is seen. IMPRESSION: No active disease. Electronically Signed   By: Inez Catalina M.D.   On: 01/28/2021 10:48   ECHO TEE  Result Date: 01/08/2021    TRANSESOPHOGEAL ECHO REPORT   Patient Name:   MIN Joss Date of Exam: 01/08/2021 Medical Rec #:  [id number]    Height:       70.5 in Accession #:    [id number]   Weight:       175.9 lb Date of Birth:  September 16, 1956    BSA:          1.987 m Patient Age:    19 years     BP:           86/59 mmHg Patient Gender: M            HR:           91 bpm. Exam Location:  Inpatient Procedure: Transesophageal Echo, Cardiac Doppler and Color Doppler Indications:     Bacteremia  History:         Patient has prior history of Echocardiogram examinations, most                  recent 01/06/2021. Previous Myocardial Infarction, Stroke,                  Endocarditis and AVR. MVR, Arrythmias:Atrial Fibrillation,  Signs/Symptoms:Bacteremia; Risk Factors:Hypertension.  Sonographer:     Dustin Flock Referring Phys:  [id number] Tami Lin DUKE Diagnosing Phys: Mertie Moores MD PROCEDURE: The transesophogeal probe was passed without difficulty through the esophogus of the patient. Sedation performed by performing physician. The patient was monitored while under deep sedation. Anesthestetic sedation was provided intravenously by  Anesthesiology: 233.35m of Propofol, 873mof Lidocaine. The patient developed no complications during the procedure. IMPRESSIONS  1. Left  ventricular ejection fraction, by estimation, is 55 to 60%. The left ventricle has normal function.  2. Right ventricular systolic function is normal. The right ventricular size is normal.  3. No left atrial/left atrial appendage thrombus was detected.  4. The mitral valve has been repaired/replaced. No evidence of mitral valve regurgitation. No evidence of mitral stenosis.  5. The tricuspid valve is abnormal. Tricuspid valve regurgitation is moderate.  6. The aortic valve has been repaired/replaced. Aortic valve regurgitation is not visualized. No aortic stenosis is present. FINDINGS  Left Ventricle: Left ventricular ejection fraction, by estimation, is 55 to 60%. The left ventricle has normal function. The left ventricular internal cavity size was normal in size. Right Ventricle: The right ventricular size is normal. No increase in right ventricular wall thickness. Right ventricular systolic function is normal. Left Atrium: Left atrial size was normal in size. No left atrial/left atrial appendage thrombus was detected. Right Atrium: Right atrial size was normal in size. Pericardium: There is no evidence of pericardial effusion. Mitral Valve: The mitral valve has been repaired/replaced. No evidence of mitral valve regurgitation. There is a bioprosthetic valve present in the mitral position. No evidence of mitral valve stenosis. There is no evidence of mitral valve vegetation. Tricuspid Valve: The tricuspid valve is abnormal. Tricuspid valve regurgitation is moderate. Aortic Valve: The aortic valve has been repaired/replaced. Aortic valve regurgitation is not visualized. No aortic stenosis is present. There is a bioprosthetic valve present in the aortic position. There is no evidence of aortic valve vegetation. Pulmonic Valve: The pulmonic valve was grossly normal. Pulmonic valve regurgitation is trivial. Aorta: The aortic root and ascending aorta are structurally normal, with no evidence of dilitation. There is  minimal (Grade I) plaque. IAS/Shunts: No atrial level shunt detected by color flow Doppler. PhMertie MooresD Electronically signed by PhMertie MooresD Signature Date/Time: 01/08/2021/3:13:00 PM    Final    [place] SITE RITE  Result Date: 01/20/2021 If Site Rite image not attached, placement could not be confirmed due to current cardiac rhythm.  [place] SITE RITE  Result Date: 01/10/2021 If [facility] not [facility], placement could not be confirmed due to current cardiac rhythm.   Colonoscopy 01/30/2021 Dr. HuBenson Norwaympression:       - Five 3 to 8 mm polyps at the recto-sigmoid colon,                            in the descending colon and in the transverse                            colon, removed with a cold snare. Resected and                            retrieved.  Recommendation: - Return patient to hospital ward for ongoing care.                           -  Resume regular diet.                           - Continue present medications.                           - Await pathology results.                           - Repeat colonoscopy in 3 years for surveillance.                           - Resume Eliquis in 3 days.  EGD 01/30/2021 Dr. Benson Norway Impression:       - Normal esophagus.                           - Normal stomach.                           - Erythematous duodenopathy.                           - No specimens collected.  Recommendation: - Resume Eliquis in 3 days.                           - Follow HGB and transfuse as necessary.  Subjective: Today, patient was seen and examined at bedside.  Patient denies any nausea, vomiting fever or chills.  Has had a bowel movement.  No blood per stool.  Discharge Exam: Vitals with BMI 02/06/2021 02/05/2021 02/05/2021  Height - - -  Weight - - -  BMI - - -  Systolic 585 277 824  Diastolic 80 72 66  Pulse 97 79 96   GENERAL: Patient is alert awake and oriented. Not in obvious distress.  Thinly built. HENT: No scleral pallor or icterus.  Pupils equally reactive to light. Oral mucosa is moist NECK: is supple, no gross swelling noted. CHEST: Clear to auscultation. No crackles or wheezes.  Diminished breath sounds bilaterally. CVS: S1 and S2 heard, no murmur. Regular rate and rhythm.  ABDOMEN: Soft, non-tender, bowel sounds are present. EXTREMITIES: No edema.  Right upper extremity PICC line in place. CNS: Cranial nerves are intact. No focal motor deficits. SKIN: warm and dry without rashes.  Labs: BNP (last 3 results) Recent Labs    01/04/21 0322  BNP 235.3*   Basic Metabolic Panel: Recent Labs  Lab 01/30/21 0251 01/31/21 0503 02/01/21 0521 02/02/21 0633  NA 137 138 140 140  K 4.4 4.0 4.1 4.1  CL 104 107 106 107  CO2 '25 23 26 25  ' GLUCOSE 102* 106* 98 92  BUN '13 15 14 13  ' CREATININE 1.13 1.48* 1.11 1.31*  CALCIUM 8.4* 8.5* 8.7* 8.7*   Liver Function Tests: No results for input(s): AST, ALT, ALKPHOS, BILITOT, PROT, ALBUMIN in the last 168 hours. No results for input(s): LIPASE, AMYLASE in the last 168 hours. No results for input(s): AMMONIA in the last 168 hours. CBC: Recent Labs  Lab 01/30/21 0251 01/31/21 0503 02/01/21 0521 02/01/21 1138 02/02/21 0633 02/02/21 1820 02/03/21 0415  WBC 6.1 5.5 5.0  --  4.7  --   --   HGB 7.4* 7.4* 7.0*  7.5* 6.9* 8.6* 8.2*  HCT 22.2* 23.2* 21.8* 23.5* 22.0* 26.5* 25.2*  MCV 104.2* 105.9* 107.4*  --  108.4*  --   --   PLT 212 237 215  --  226  --   --    Cardiac Enzymes: No results for input(s): CKTOTAL, CKMB, CKMBINDEX, TROPONINI in the last 168 hours. BNP: Invalid input(s): POCBNP CBG: No results for input(s): GLUCAP in the last 168 hours. D-Dimer No results for input(s): DDIMER in the last 72 hours. Hgb A1c No results for input(s): HGBA1C in the last 72 hours. Lipid Profile No results for input(s): CHOL, HDL, LDLCALC, TRIG, CHOLHDL, LDLDIRECT in the last 72 hours. Thyroid function studies No results for input(s): TSH, T4TOTAL, T3FREE, THYROIDAB in the  last 72 hours.  Invalid input(s): FREET3 Anemia work up No results for input(s): VITAMINB12, FOLATE, FERRITIN, TIBC, IRON, RETICCTPCT in the last 72 hours. Urinalysis    Component Value Date/Time   COLORURINE YELLOW 01/04/2021 1729   APPEARANCEUR CLEAR 01/04/2021 1729   LABSPEC 1.012 01/04/2021 1729   PHURINE 5.0 01/04/2021 1729   GLUCOSEU NEGATIVE 01/04/2021 1729   HGBUR MODERATE (A) 01/04/2021 1729   BILIRUBINUR NEGATIVE 01/04/2021 1729   KETONESUR NEGATIVE 01/04/2021 1729   PROTEINUR NEGATIVE 01/04/2021 1729   NITRITE NEGATIVE 01/04/2021 1729   LEUKOCYTESUR NEGATIVE 01/04/2021 1729    Microbiology Recent Results (from the past 240 hour(s))  Resp Panel by RT-PCR (Flu A&B, Covid) Nasopharyngeal Swab     Status: None   Collection Time: 01/28/21 10:27 AM   Specimen: Nasopharyngeal Swab; Nasopharyngeal(NP) swabs in vial transport medium  Result Value Ref Range Status   SARS Coronavirus 2 by RT PCR NEGATIVE NEGATIVE Final    Comment: (NOTE) SARS-CoV-2 target nucleic acids are NOT DETECTED.  The SARS-CoV-2 RNA is generally detectable in upper respiratory specimens during the acute phase of infection. The lowest concentration of SARS-CoV-2 viral copies this assay can detect is 138 copies/mL. A negative result does not preclude SARS-Cov-2 infection and should not be used as the sole basis for treatment or other patient management decisions. A negative result may occur with  improper specimen collection/handling, submission of specimen other than nasopharyngeal swab, presence of viral mutation(s) within the areas targeted by this assay, and inadequate number of viral copies(<138 copies/mL). A negative result must be combined with clinical observations, patient history, and epidemiological information. The expected result is Negative.  Fact Sheet for Patients:  EntrepreneurPulse.com.au  Fact Sheet for Healthcare Providers:   IncredibleEmployment.be  This test is no t yet approved or cleared by the [place] FDA and  has been authorized for detection and/or diagnosis of SARS-CoV-2 by FDA under an Emergency Use Authorization (EUA). This EUA will remain  in effect (meaning this test can be used) for the duration of the COVID-19 declaration under Section 564(b)(1) of the Act, 21 U.S.C.section 360bbb-3(b)(1), unless the authorization is terminated  or revoked sooner.       Influenza A by PCR NEGATIVE NEGATIVE Final   Influenza B by PCR NEGATIVE NEGATIVE Final    Comment: (NOTE) The Xpert Xpress SARS-CoV-2/FLU/RSV plus assay is intended as an aid in the diagnosis of influenza from Nasopharyngeal swab specimens and should not be used as a sole basis for treatment. Nasal washings and aspirates are unacceptable for Xpert Xpress SARS-CoV-2/FLU/RSV testing.  Fact Sheet for Patients: EntrepreneurPulse.com.au  Fact Sheet for Healthcare Providers: IncredibleEmployment.be  This test is not yet approved or cleared by the [place] and has been authorized for  detection and/or diagnosis of SARS-CoV-2 by FDA under an Emergency Use Authorization (EUA). This EUA will remain in effect (meaning this test can be used) for the duration of the COVID-19 declaration under Section 564(b)(1) of the Act, 21 U.S.C. section 360bbb-3(b)(1), unless the authorization is terminated or revoked.  Performed at [facility], [place] [address], [place], [place] [address]   MRSA PCR Screening     Status: None   Collection Time: 01/28/21  3:32 PM   Specimen: Nasopharyngeal  Result Value Ref Range Status   MRSA by PCR NEGATIVE NEGATIVE Final    Comment:        The GeneXpert MRSA Assay (FDA approved for NASAL specimens only), is one component of a comprehensive MRSA colonization surveillance program. It is not intended to diagnose MRSA infection nor  to guide or monitor treatment for MRSA infections. Performed at [facility], [place] [address] [place], [place] [address]     Time coordinating discharge: Approximately 12 minutes  Flora Lipps, MD  [facility]

## 2021-02-05 NOTE — Plan of Care (Signed)

## 2021-02-05 NOTE — Progress Notes (Signed)
Chaplain engaged in an initial visit with Donald Harrington.  Donald Harrington shared about waiting on insurance so that he could go to a facility.  He also talked about his healthcare journey and what his daily life looks like.  Donald Harrington shared that he likes to start his day around 12:00 am.  It is during that time that he drinks his coffee and green tea.  After that he enjoys walking throughout the day.  He uses that time also to take his prescribed medicines.  Donald Harrington stated how much he enjoys getting out and engaging in some physical activity.  Because he gets his day started so early, by 8 or 9:00 am he is back at home napping or taking more medicine.  Donald Harrington admits that he sleeps in a way that works for him because he usually cannot get 7-8 hours of consecutive sleep.  He takes small naps throughout the day.  Donald Harrington detailed that he has been like that for awhile.  He stated that when he sleeps he is constantly dreaming and that it does not stop.  His mind is always going.  Donald Harrington stated that he has taken several different medicines but that none of them work.  Chaplain affirmed that Donald Harrington seems to have created a system around taking medicine, physical activity and sleeping that works for him.    Donald Harrington also shared about the spiritual experiences he has had while in the hospital on ventilators or in a comatose state.  He shared that he was able to hear and receive information while in a comatose state and he also detailed what he experienced while in a near death encounter.  Chaplain affirmed these events in his life that have made him aware of where he will spend eternity.    Chaplain offered listening and presence.  Chaplain will follow-up.      02/05/21 1200  Clinical Encounter Type  Visited With Patient  Visit Type Initial

## 2021-02-05 NOTE — TOC Progression Note (Addendum)
Transition of Care [facility]) - Progression Note    Patient Details  Name: Donald Harrington MRN: [id number] Date of Birth: 10-Oct-1956  Transition of Care [facility]) CM/SW [place], [place] Phone Number: 02/05/2021, 10:35 AM  Clinical Narrative:   Contacted admissions at [facility].  No word from [employer] yet on authorization. TOC will continue to follow during the course of hospitalization.  Addendum:  Received text this PM from [facility] stating "you may want to call insurance at this number."  I called, and Heather at [facility] confirmed that they had not received referral until Monday, and received no clinicals.  I sent clinicals and patient was approved in 1.5 hours. Now just need COVID test prior to patient return.  Adendum:  MD order for rapid COVID seen and appreciated.  PTAR package put together, and unit secretary will call when results of COVID test are back.  Nursing, please call report to [phone].  TOC sign off.     Expected Discharge Plan: [place] Barriers to Discharge: Continued Medical Work up  Expected Discharge Plan and Services Expected Discharge Plan: [place]   Discharge Planning Services: CM Consult Post Acute Care Choice: [place] Living arrangements for the past 2 months: Apartment Expected Discharge Date: 02/03/21                                     Social Determinants of Health (SDOH) Interventions    Readmission Risk Interventions No flowsheet data found.

## 2021-02-05 NOTE — Progress Notes (Signed)
PROGRESS NOTE  Donald Harrington YBW:[id number] DOB: November 01, 1955 DOA: 01/28/2021 PCP: Patient, No Pcp Per (Inactive)   LOS: 8 days   Brief narrative: Donald Harrington is a 65 y.o. male with past medical history of endocarditis, prosthetic aortic valve replacement, mitral valve replacement, paroxysmal atrial fibrillation on Eliquis, embolic stroke,  PUD and recent admissions for GI bleeding return to hospital from skilled nursing facility with complaints of GI bleed.  Patient was getting IV antibiotics at the skilled nursing facility.  During hospitalization, GI was consulted and work-up was reassuring.  He received 2 units of packed RBC and his hemoglobin remained stable.  He was started back on Eliquis and has been considered stable for disposition back to [facility], insurance authorization is still pending at this time.  Assessment/Plan:  Principal Problem:   Acute blood loss anemia Active Problems:   GI bleed   PAF (paroxysmal atrial fibrillation) (HCC)   History of aortic valve replacement   History of mitral valve replacement   Bacteremia due to Streptococcus   Hypotension  Acute blood loss anemia due to recurrent GI bleeding:  Patient underwent endoscopy and colonoscopy by GI.  GI bleed was thought to be secondary duodenopathy or polyps which were resected at colonoscopy.  Patient received total of 2 units of packed RBC during hospitalization including IV iron.  Initially Eliquis was on hold which has been resumed at this time after GI recommendation.  Continue PPI.  Repeat colonoscopy recommended in 3 years  B12 deficiency:  - Supplemented IM while in hospital, can convert to po at discharge  PAF, History of bioprosthetic AVR, MVR:  Eliquis has been restarted for 19 as per GI recommendation.  We Donald need to closely monitor CBC.  Hypotension:  Resolved at this time  Multifocal embolic CVA:  Secondary to subtherapeutic anticoagulation in the past.  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 Diagnosed at his last hospitalization and discharged on 01/13/21 with Gentamicin and Penicillin IV x 2 weeks followed by 4 additional weeks of Penicillin.  Continue penicillin to continue at the skilled nursing facility. End date remains 02/17/2021. Has completed gentamicin.    DVT prophylaxis: SCDs Start: 01/28/21 1304 apixaban (ELIQUIS) tablet 5 mg    Code Status: Full code  Family Communication: None  Status is: Inpatient  Remains inpatient appropriate because:Need for placement, on IV antibiotics   Dispo: The patient is from: SNF              Anticipated d/c is to: SNF              Patient currently is medically stable to d/c.   Difficult to place patient No  Consultants:  GI  Procedures:  EGD and colonoscopy on 01/30/2021  Anti-infectives:  Anti-infectives (From admission, onward)   Start     Dose/Rate Route Frequency Ordered Stop   01/28/21 1800  penicillin G potassium 12 Million Units in dextrose 5 % 500 mL continuous infusion        12 Million Units 41.7 mL/hr over 12 Hours Intravenous Every 12 hours 01/28/21 1408       Subjective: Today, patient was seen and examined at bedside.  Patient denies any nausea, vomiting fever or chills.  Has had a bowel movement.  No blood per stool.  Objective: Vitals:   02/04/21 2244 02/05/21 0619  BP: 124/74 (!) 150/74  Pulse: 87 77  Resp: 19  19  Temp: 98 F (36.7 C) 98 F (36.7 C)  SpO2: 96% 99%    Intake/Output Summary (Last 24 hours) at 02/05/2021 1254 Last data filed at 02/05/2021 1158 Gross per 24 hour  Intake 220 ml  Output 1850 ml  Net -1630 ml   Filed Weights   01/28/21 1600  Weight: 73.3 kg   Body mass index is 23.19 kg/m.   Physical Exam: GENERAL: Patient is alert awake and oriented. Not in obvious distress.  Thinly built. HENT: No scleral pallor or  icterus. Pupils equally reactive to light. Oral mucosa is moist NECK: is supple, no gross swelling noted. CHEST: Clear to auscultation. No crackles or wheezes.  Diminished breath sounds bilaterally. CVS: S1 and S2 heard, no murmur. Regular rate and rhythm.  ABDOMEN: Soft, non-tender, bowel sounds are present. EXTREMITIES: No edema.  Right upper extremity PICC line in place. CNS: Cranial nerves are intact. No focal motor deficits. SKIN: warm and dry without rashes.  Data Review: I have personally reviewed the following laboratory data and studies,  CBC: Recent Labs  Lab 01/30/21 0251 01/31/21 0503 02/01/21 0521 02/01/21 1138 02/02/21 0633 02/02/21 1820 02/03/21 0415  WBC 6.1 5.5 5.0  --  4.7  --   --   HGB 7.4* 7.4* 7.0* 7.5* 6.9* 8.6* 8.2*  HCT 22.2* 23.2* 21.8* 23.5* 22.0* 26.5* 25.2*  MCV 104.2* 105.9* 107.4*  --  108.4*  --   --   PLT 212 237 215  --  226  --   --    Basic Metabolic Panel: Recent Labs  Lab 01/30/21 0251 01/31/21 0503 02/01/21 0521 02/02/21 0633  NA 137 138 140 140  K 4.4 4.0 4.1 4.1  CL 104 107 106 107  CO2 25 23 26 25   GLUCOSE 102* 106* 98 92  BUN 13 15 14 13   CREATININE 1.13 1.48* 1.11 1.31*  CALCIUM 8.4* 8.5* 8.7* 8.7*   Liver Function Tests: No results for input(s): AST, ALT, ALKPHOS, BILITOT, PROT, ALBUMIN in the last 168 hours. No results for input(s): LIPASE, AMYLASE in the last 168 hours. No results for input(s): AMMONIA in the last 168 hours. Cardiac Enzymes: No results for input(s): CKTOTAL, CKMB, CKMBINDEX, TROPONINI in the last 168 hours. BNP (last 3 results) Recent Labs    01/04/21 0322  BNP 648.0*    ProBNP (last 3 results) No results for input(s): PROBNP in the last 8760 hours.  CBG: No results for input(s): GLUCAP in the last 168 hours. Recent Results (from the past 240 hour(s))  Resp Panel by RT-PCR (Flu A&B, Covid) Nasopharyngeal Swab     Status: None   Collection Time: 01/28/21 10:27 AM   Specimen: Nasopharyngeal  Swab; Nasopharyngeal(NP) swabs in vial transport medium  Result Value Ref Range Status   SARS Coronavirus 2 by RT PCR NEGATIVE NEGATIVE Final    Comment: (NOTE) SARS-CoV-2 target nucleic acids are NOT DETECTED.  The SARS-CoV-2 RNA is generally detectable in upper respiratory specimens during the acute phase of infection. The lowest concentration of SARS-CoV-2 viral copies this assay can detect is 138 copies/mL. A negative result does not preclude SARS-Cov-2 infection and should not be used as the sole basis for treatment or other patient management decisions. A negative result may occur with  improper specimen collection/handling, submission of specimen other than nasopharyngeal swab, presence of viral mutation(s) within the areas targeted by this assay, and inadequate number of viral copies(<138 copies/mL). A negative result must be combined with clinical observations, patient history, and  epidemiological information. The expected result is Negative.  Fact Sheet for Patients:  EntrepreneurPulse.com.au  Fact Sheet for Healthcare Providers:  IncredibleEmployment.be  This test is no t yet approved or cleared by the [place] FDA and  has been authorized for detection and/or diagnosis of SARS-CoV-2 by FDA under an Emergency Use Authorization (EUA). This EUA Donald remain  in effect (meaning this test can be used) for the duration of the COVID-19 declaration under Section 564(b)(1) of the Act, 21 U.S.C.section 360bbb-3(b)(1), unless the authorization is terminated  or revoked sooner.       Influenza A by PCR NEGATIVE NEGATIVE Final   Influenza B by PCR NEGATIVE NEGATIVE Final    Comment: (NOTE) The Xpert Xpress SARS-CoV-2/FLU/RSV plus assay is intended as an aid in the diagnosis of influenza from Nasopharyngeal swab specimens and should not be used as a sole basis for treatment. Nasal washings and aspirates are unacceptable for Xpert Xpress  SARS-CoV-2/FLU/RSV testing.  Fact Sheet for Patients: EntrepreneurPulse.com.au  Fact Sheet for Healthcare Providers: IncredibleEmployment.be  This test is not yet approved or cleared by the [place] FDA and has been authorized for detection and/or diagnosis of SARS-CoV-2 by FDA under an Emergency Use Authorization (EUA). This EUA Donald remain in effect (meaning this test can be used) for the duration of the COVID-19 declaration under Section 564(b)(1) of the Act, 21 U.S.C. section 360bbb-3(b)(1), unless the authorization is terminated or revoked.  Performed at [facility], [place] [address] [place], [place] [address]   MRSA PCR Screening     Status: None   Collection Time: 01/28/21  3:32 PM   Specimen: Nasopharyngeal  Result Value Ref Range Status   MRSA by PCR NEGATIVE NEGATIVE Final    Comment:        The GeneXpert MRSA Assay (FDA approved for NASAL specimens only), is one component of a comprehensive MRSA colonization surveillance program. It is not intended to diagnose MRSA infection nor to guide or monitor treatment for MRSA infections. Performed at [facility], [place] [address], [place], [place] [address]      Studies: No results found.    Flora Lipps, MD  [facility] 02/05/2021  If 7PM-7AM, please contact night-coverage

## 2021-02-06 DIAGNOSIS — Z952 Presence of prosthetic heart valve: Secondary | ICD-10-CM

## 2021-02-06 DIAGNOSIS — I959 Hypotension, unspecified: Secondary | ICD-10-CM

## 2021-02-06 DIAGNOSIS — I480 Paroxysmal atrial fibrillation: Secondary | ICD-10-CM

## 2021-02-06 LAB — CBC
HCT: 27.4 % — ABNORMAL LOW (ref 39.0–52.0)
Hemoglobin: 8.8 g/dL — ABNORMAL LOW (ref 13.0–17.0)
MCH: 32.4 pg (ref 26.0–34.0)
MCHC: 32.1 g/dL (ref 30.0–36.0)
MCV: 100.7 fL — ABNORMAL HIGH (ref 80.0–100.0)
Platelets: 192 10*3/uL (ref 150–400)
RBC: 2.72 MIL/uL — ABNORMAL LOW (ref 4.22–5.81)
RDW: 16.6 % — ABNORMAL HIGH (ref 11.5–15.5)
WBC: 5.4 10*3/uL (ref 4.0–10.5)
nRBC: 0 % (ref 0.0–0.2)

## 2021-02-06 NOTE — TOC Transition Note (Signed)
Transition of Care [facility]) - CM/SW Discharge Note   Patient Details  Name: Donald Harrington MRN: [id number] Date of Birth: May 26, 1956  [facility]) CM/SW Contact:  Trish Mage, LCSW Phone Number: 02/06/2021, 10:13 AM   Clinical Narrative:   Patient will return to [facility] today.  PTAR arranged. Nursing, please call report to [phone]. TOC sign off.    Final next level of care: Skilled Nursing Facility Barriers to Discharge: Barriers Resolved   Patient Goals and CMS Choice Patient states their goals for this hospitalization and ongoing recovery are:: i do not have any CMS Medicare.gov Compare Post Acute Care list provided to:: Patient Choice offered to / list presented to : Patient  Discharge Placement                       Discharge Plan and Services   Discharge Planning Services: CM Consult Post Acute Care Choice: [place]                               Social Determinants of Health (SDOH) Interventions     Readmission Risk Interventions No flowsheet data found.

## 2021-02-06 NOTE — Progress Notes (Signed)
Report called to nurse, Armstrong Sink, at [facility]

## 2021-03-13 ENCOUNTER — Inpatient Hospital Stay: Payer: Medicare HMO | Admitting: Adult Health

## 2021-06-25 ENCOUNTER — Telehealth: Payer: Self-pay | Admitting: Oncology

## 2021-06-25 NOTE — Telephone Encounter (Signed)
Called patient to schedule New Patient Consult for Chronic Blood [place].  Patient Declined  Notified Donald Harrington at [employer]

## 2021-07-14 ENCOUNTER — Telehealth: Payer: Self-pay | Admitting: Oncology

## 2021-07-14 NOTE — Telephone Encounter (Signed)
Enid Derry from Dr Herbie [place] Dough's office called to see if patient has been seen.  Informed Enid Derry patient refused an Appt - [place] office was notified of refusal
